# Patient Record
Sex: Male | Born: 1935 | Race: White | Hispanic: No | Marital: Married | Smoking: Former smoker
Health system: Southern US, Community
[De-identification: ages and names within clinical notes are randomized; demographics above are authoritative.]

## PROBLEM LIST (undated history)

## (undated) DIAGNOSIS — K859 Acute pancreatitis without necrosis or infection, unspecified: Secondary | ICD-10-CM

## (undated) DIAGNOSIS — R42 Dizziness and giddiness: Secondary | ICD-10-CM

## (undated) DIAGNOSIS — J189 Pneumonia, unspecified organism: Secondary | ICD-10-CM

## (undated) DIAGNOSIS — R251 Tremor, unspecified: Secondary | ICD-10-CM

## (undated) DIAGNOSIS — C649 Malignant neoplasm of unspecified kidney, except renal pelvis: Secondary | ICD-10-CM

## (undated) DIAGNOSIS — D369 Benign neoplasm, unspecified site: Secondary | ICD-10-CM

## (undated) DIAGNOSIS — K219 Gastro-esophageal reflux disease without esophagitis: Secondary | ICD-10-CM

## (undated) DIAGNOSIS — M1990 Unspecified osteoarthritis, unspecified site: Secondary | ICD-10-CM

## (undated) DIAGNOSIS — K649 Unspecified hemorrhoids: Secondary | ICD-10-CM

## (undated) DIAGNOSIS — K922 Gastrointestinal hemorrhage, unspecified: Secondary | ICD-10-CM

## (undated) DIAGNOSIS — R413 Other amnesia: Secondary | ICD-10-CM

## (undated) DIAGNOSIS — R55 Syncope and collapse: Secondary | ICD-10-CM

## (undated) HISTORY — PX: NEPHRECTOMY: SHX65

## (undated) HISTORY — DX: Gastrointestinal hemorrhage, unspecified: K92.2

## (undated) HISTORY — DX: Benign neoplasm, unspecified site: D36.9

## (undated) HISTORY — PX: KNEE SURGERY: SHX244

## (undated) HISTORY — PX: EYE SURGERY: SHX253

## (undated) HISTORY — DX: Acute pancreatitis without necrosis or infection, unspecified: K85.90

## (undated) HISTORY — PX: SPINAL FUSION: SHX223

## (undated) HISTORY — PX: OTHER SURGICAL HISTORY: SHX169

## (undated) HISTORY — PX: CHOLECYSTECTOMY: SHX55

## (undated) HISTORY — DX: Unspecified hemorrhoids: K64.9

## (undated) HISTORY — DX: Other amnesia: R41.3

## (undated) HISTORY — DX: Unspecified osteoarthritis, unspecified site: M19.90

## (undated) HISTORY — DX: Malignant neoplasm of unspecified kidney, except renal pelvis: C64.9

## (undated) HISTORY — DX: Syncope and collapse: R55

## (undated) HISTORY — PX: BACK SURGERY: SHX140

## (undated) HISTORY — DX: Gastro-esophageal reflux disease without esophagitis: K21.9

## (undated) HISTORY — DX: Dizziness and giddiness: R42

## (undated) HISTORY — PX: SHOULDER SURGERY: SHX246

## (undated) HISTORY — PX: HAND SURGERY: SHX662

## (undated) HISTORY — DX: Tremor, unspecified: R25.1

---

## 1973-09-30 HISTORY — PX: CHOLECYSTECTOMY: SHX55

## 1973-09-30 HISTORY — PX: APPENDECTOMY: SHX54

## 1987-10-01 HISTORY — PX: OTHER SURGICAL HISTORY: SHX169

## 1998-09-30 HISTORY — PX: NEPHRECTOMY: SHX65

## 1998-09-30 HISTORY — PX: KNEE SURGERY: SHX244

## 2007-05-19 ENCOUNTER — Ambulatory Visit: Payer: Self-pay | Admitting: Cardiology

## 2007-06-02 ENCOUNTER — Ambulatory Visit: Payer: Self-pay | Admitting: Internal Medicine

## 2007-06-16 ENCOUNTER — Encounter: Payer: Self-pay | Admitting: Internal Medicine

## 2007-06-16 ENCOUNTER — Ambulatory Visit: Payer: Self-pay | Admitting: Internal Medicine

## 2007-06-16 ENCOUNTER — Ambulatory Visit (HOSPITAL_COMMUNITY): Admission: RE | Admit: 2007-06-16 | Discharge: 2007-06-16 | Payer: Self-pay | Admitting: Internal Medicine

## 2007-06-16 HISTORY — PX: COLONOSCOPY: SHX174

## 2007-06-17 ENCOUNTER — Ambulatory Visit: Payer: Self-pay | Admitting: Physician Assistant

## 2007-12-31 ENCOUNTER — Ambulatory Visit: Payer: Self-pay | Admitting: Internal Medicine

## 2008-02-11 ENCOUNTER — Ambulatory Visit: Payer: Self-pay | Admitting: Internal Medicine

## 2008-05-24 ENCOUNTER — Ambulatory Visit: Payer: Self-pay | Admitting: Internal Medicine

## 2008-08-08 DIAGNOSIS — J189 Pneumonia, unspecified organism: Secondary | ICD-10-CM

## 2008-08-08 DIAGNOSIS — R109 Unspecified abdominal pain: Secondary | ICD-10-CM | POA: Insufficient documentation

## 2008-08-08 DIAGNOSIS — C649 Malignant neoplasm of unspecified kidney, except renal pelvis: Secondary | ICD-10-CM | POA: Insufficient documentation

## 2008-08-08 DIAGNOSIS — R110 Nausea: Secondary | ICD-10-CM

## 2008-08-08 DIAGNOSIS — K859 Acute pancreatitis without necrosis or infection, unspecified: Secondary | ICD-10-CM | POA: Insufficient documentation

## 2008-08-17 ENCOUNTER — Ambulatory Visit: Payer: Self-pay | Admitting: Internal Medicine

## 2009-07-18 ENCOUNTER — Encounter (INDEPENDENT_AMBULATORY_CARE_PROVIDER_SITE_OTHER): Payer: Self-pay | Admitting: *Deleted

## 2009-09-18 ENCOUNTER — Ambulatory Visit: Payer: Self-pay | Admitting: Internal Medicine

## 2009-09-18 DIAGNOSIS — R1032 Left lower quadrant pain: Secondary | ICD-10-CM | POA: Insufficient documentation

## 2009-09-18 DIAGNOSIS — R1013 Epigastric pain: Secondary | ICD-10-CM

## 2009-09-19 ENCOUNTER — Ambulatory Visit: Payer: Self-pay | Admitting: Gastroenterology

## 2009-10-06 ENCOUNTER — Telehealth: Payer: Self-pay | Admitting: Gastroenterology

## 2009-10-12 ENCOUNTER — Encounter: Payer: Self-pay | Admitting: Internal Medicine

## 2009-10-13 ENCOUNTER — Ambulatory Visit (HOSPITAL_COMMUNITY): Admission: RE | Admit: 2009-10-13 | Discharge: 2009-10-13 | Payer: Self-pay | Admitting: Internal Medicine

## 2009-10-16 ENCOUNTER — Telehealth (INDEPENDENT_AMBULATORY_CARE_PROVIDER_SITE_OTHER): Payer: Self-pay | Admitting: *Deleted

## 2009-10-17 ENCOUNTER — Encounter: Payer: Self-pay | Admitting: Internal Medicine

## 2009-10-17 ENCOUNTER — Telehealth (INDEPENDENT_AMBULATORY_CARE_PROVIDER_SITE_OTHER): Payer: Self-pay | Admitting: *Deleted

## 2009-10-26 ENCOUNTER — Encounter: Payer: Self-pay | Admitting: Internal Medicine

## 2009-10-30 ENCOUNTER — Encounter: Payer: Self-pay | Admitting: Internal Medicine

## 2009-11-03 ENCOUNTER — Encounter: Payer: Self-pay | Admitting: Internal Medicine

## 2009-12-07 ENCOUNTER — Encounter: Payer: Self-pay | Admitting: Internal Medicine

## 2009-12-14 ENCOUNTER — Encounter: Payer: Self-pay | Admitting: Internal Medicine

## 2009-12-18 ENCOUNTER — Encounter: Payer: Self-pay | Admitting: Internal Medicine

## 2010-04-19 ENCOUNTER — Ambulatory Visit: Payer: Self-pay | Admitting: Internal Medicine

## 2010-04-19 DIAGNOSIS — K5900 Constipation, unspecified: Secondary | ICD-10-CM | POA: Insufficient documentation

## 2010-04-20 ENCOUNTER — Encounter: Payer: Self-pay | Admitting: Internal Medicine

## 2010-04-25 ENCOUNTER — Ambulatory Visit: Payer: Self-pay | Admitting: Gastroenterology

## 2010-10-30 NOTE — Assessment & Plan Note (Signed)
Summary: CONSULT FOR SURGERY/WANTS RMR ONLY/SS   Visit Type:  Follow-up Visit Primary Care Provider:  Sasser  Chief Complaint:  Consult for surgery.  History of Present Illness: Recurrent pancreatitis. Status post pancreatic and biliary sphincterotomies in a setting of multiple ERCPs in the past few years. multiple episodes of pancreatitis. He's had a number of episodes of abdominal pain lasting 3-4 days this year which have spontaneously resolved.   He has not been hospitalized this year. He's followed closely with Dr. Fredric Mare over at  [facility]. States overall the first 6 months of this years much better than 2010. He has chronic constipation; he does complain of "gurgling" in his abdomen frequently. He is again for having pain since she was seen here in December of 2010. He has no lower taking antispasmodics acid suppression therapy or pancreatic enzyme supplements. There's been some discussion about a peustow procedure as far as treating his pancreatitis from a surgical prospective is concerned. Her some discussion by giving him to see Dr. Rise Mu at  [facility]. Has multiple questions about the surgical option while he remains in fairly good health.  History of a seen in colon adenoma removed by me colonoscopy in 2008. He is due for surveillance colonoscopy 2013.  P  Current Medications (verified): 1)  Hydrocodone .... As Needed  Allergies (verified): 1)  ! Reglan  Past History:  Family History: Last updated: 04/19/2010  Adopted but  biological mother died with some type of cancer.  Social History: Last updated: 04/19/2010 Marital Status: Married Children: 3 Occupation: Semi-Retired  Past Surgical History: Spinal Fusion Cholecystectomy Testicular Tumor-Benign Hand Surgery GI Bleed-Ulcer Pancreatic Duct Stent x 3 Right Kidney removed Right knee surgery  Family History:  Adopted but  biological mother died with some type of cancer.  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 The trend, so far,  this year has been one of improvement with fewer,  less severe episodes. Patient is very much interested in at least learning more about the surgical option i.e.  a peustow procedure.  Chronic constipation with abdominal "gurgling". History of a colonic adenoma.  Recommendations: We will go ahead and orchestrate an elective appointment to see Dr. Rise Mu at [facility] just  to get a surgical perspective on this  potential option along with the pros and cons of this avenue of therapy  Liberalize use of MiraLax 17 g orally at bedtime if no bowel movement on any given day.  Trial a probiotic therapy in the way of Align - - one capsule daily. Samples provided.  Plan to see this nice gentleman back in the office in 3 months  Appended Document: CONSULT FOR SURGERY/WANTS RMR ONLY/SS Patient aware.Marland KitchenAppt to see Dr. Marilynn Rail w/ General Surgery on 05/08/10 @ 11:15; will send records.  Appended Document: Orders Update    Clinical Lists Changes  Problems: Added new problem of CONSTIPATION (ICD-564.00) Orders: Added new Service order of Est. Patient Level IV (60454) - Signed

## 2010-10-30 NOTE — Letter (Signed)
Summary: [facility] REFERRAL  [facility] REFERRAL   Imported By: Ave Filter 10/17/2009 12:45:31  _____________________________________________________________________  External Attachment:    Type:   Image     Comment:   External Document

## 2010-10-30 NOTE — Letter (Signed)
Summary: [facility]ICE NOTE/[facility]  OFFICE NOTE/[facility]   Imported By: Diana Eves 12/14/2009 11:53:37  _____________________________________________________________________  External Attachment:    Type:   Image     Comment:   External Document

## 2010-10-30 NOTE — Letter (Signed)
Summary: LABS/[facility]  LABS/[facility]   Imported By: Diana Eves 11/03/2009 12:09:59  _____________________________________________________________________  External Attachment:    Type:   Image     Comment:   External Document

## 2010-10-30 NOTE — Letter (Signed)
Summary: CT ORDER  CT ORDER   Imported By: Ave Filter 10/12/2009 17:18:09  _____________________________________________________________________  External Attachment:    Type:   Image     Comment:   External Document

## 2010-10-30 NOTE — Progress Notes (Signed)
Summary: [facility] Referral  Phone Note Call from Patient Call back at [facility] Phone [phone]   Caller: Patient Reason for Call: Referral Action Taken: Provider Notified, Appt Scheduled Summary of Call: Mr Barth called and about his referral to [facility]...I told Mr Kuss that I have contaced [facility] and was waiting on a date for his appt. and I will call him back shortly.  I called Mr Berkel back and gave him an appt. for 10/23/09 with Dr Fredric Mare. Initial call taken by: Ave Filter,  October 17, 2009 11:17 AM     Appended Document: [facility] Referral Spoke with patient.  Abd pain a little better today.  Able to keep liquids down and urinating every 2-3 hours.  Advised if pain unmanageable or not able to stay hydrated he needs to go to ED at [facility].  Voiced understanding.

## 2010-10-30 NOTE — Progress Notes (Signed)
  Phone Note Outgoing Call Call back at [facility] Phone [phone]   Call placed by: Leanna Battles. Dixon Boos,  October 06, 2009 1:51 PM Call placed to: Patient Summary of Call: [facility].  Need to find out how he's doing.  Any further abd pain.  Still awaiting records from [facility].  If still with abd pain, Dr. Jena Gauss advises noncontrast CT a/p.     Appended Document:  LMOM to call.  Appended Document:  Patient called earlier.  I tried to call back.  LMOAM for return call.  Please let him no above info, but I'll be glad to talk with him if he has any questions.  Appended Document:  Pt called. He is still having some abd pain and agreed to have the CT.  Appended Document:  Pt is scheduled for CT Scan on 10/13/09@9 :30am  Pt aware of appt.  Appended Document:  Spoke with patient.  Last Friday, had pain more c/w pancreatitis.  Dec diet and pain resolved after two days.  Still having LLQ pain. No fever. Some nausea.  Stools thin. No blood.  CT as planned.  Will contact patient tomorrow with results.  If pain worse, devp fever he should go to ED.

## 2010-10-30 NOTE — Assessment & Plan Note (Signed)
Summary: dropped off stool/ss  pt returned ifobt- it was negative/jl  Allergies: 1)  ! Reglan  Other Orders: Immuno-chemical Fecal Occult (16109)  Appended Document: dropped off stool/ss no blood in stool; plan for tcs 2013  Appended Document: dropped off stool/ss pt aware  Appended Document: dropped off stool/ss reminder in computer

## 2010-10-30 NOTE — Progress Notes (Signed)
  Phone Note Call from Patient   Reason for Call: Talk to Doctor Summary of Call: Joshua Strong called wanting to speak to Joshua Strong regarding his appt @ [facility]. He would like for her to call him back to discuss this. He can be reached at [phone] or [phone]-  Initial call taken by: Peggyann Shoals,  October 16, 2009 11:47 AM     Appended Document:  Spoke to wife.  Patient out right now but had rough weekend with vomiting, abd pain into back like pancreatitis.  CT last week showed pancreatitis.  No fever.  Consuming clear liquids and given antiemetic and narcotic by PCP.  Will check with Durward Mallard on status of referral to Dr. Fredric Mare and discuss with Dr. Jena Gauss.    Appended Document:  I called [facility] to follow up on urgent referral for Joshua Strong and they are closed today for the holiday.Marland KitchenMarland KitchenMarland KitchenI will follow up with them on tomorrow.  Appended Document:  Discussed with Dr. Jena Gauss.  He advises patient going to [facility] ED for evaluation.  I notified wife. She states that if he is able to control pain and vomiting and drink plenty of liquids to stay hydrated they may hold off until we are able to contact Dr. Louie Casa office tomorrow.

## 2010-10-30 NOTE — Letter (Signed)
Summary: [facility] Gen surg referral  [facility] Gen surg referral   Imported By: Minna Merritts 04/20/2010 17:43:05  _____________________________________________________________________  External Attachment:    Type:   Image     Comment:   External Document

## 2010-10-30 NOTE — Letter (Signed)
Summary: [facility]   Imported By: Diana Eves 10/30/2009 16:40:28  _____________________________________________________________________  External Attachment:    Type:   Image     Comment:   External Document

## 2010-10-30 NOTE — Letter (Signed)
Summary: [facility]   Imported By: Diana Eves 10/26/2009 16:51:44  _____________________________________________________________________  External Attachment:    Type:   Image     Comment:   External Document

## 2010-10-30 NOTE — Letter (Signed)
Summary: EGD[facility]  EGD/[facility]   Imported By: Diana Eves 12/07/2009 15:44:02  _____________________________________________________________________  External Attachment:    Type:   Image     Comment:   External Document

## 2010-10-30 NOTE — Letter (Signed)
Summary: OFFICE NOTE/[facility]  OFFICE NOTE/[facility]   Imported By: Diana Eves 12/18/2009 16:06:25  _____________________________________________________________________  External Attachment:    Type:   Image     Comment:   External Document

## 2010-11-14 ENCOUNTER — Encounter: Payer: Self-pay | Admitting: Internal Medicine

## 2010-11-21 NOTE — Letter (Signed)
Summary: [facility]  [facility]   Imported By: Rexene Alberts 11/14/2010 09:52:47  _____________________________________________________________________  External Attachment:    Type:   Image     Comment:   External Document

## 2011-02-12 NOTE — Assessment & Plan Note (Signed)
NAME:  Joshua Strong, Joshua Strong                  CHART#:  [id number]   DATE:  08/17/2008                       DOB:  1931-03-22   Followup history of pancreatitis, lastly evaluated with EGD and EUS at  [facility], by Dr. Margaretha Glassing.  He had a normal pancreaticobiliary tree.  There  was no evidence of chronic pancreatitis and also EGD looked good.  The  patient tells me that he has not felt this good in sometime.  He has cut  back on aspirin and has taken a bland diet and feels well.  He is not  having any reflux symptoms either.  He has been extensively evaluated  here at [facility].  He is working out at [employer] and is very active with  his church work.  He has history of colonic adenoma.  Last colonoscopy  was on 06/16/2007.  He is due for surveillance examination in 2013.   CURRENT MEDICATIONS:  See updated list.   ALLERGIES:  Reglan.   PHYSICAL EXAMINATION:  GENERAL:  He looks really well today.  VITAL SIGNS:  Weight 177, height 5 feet 7 inches, temp 97.4, and BP  102/68, and pulse 60.  SKIN:  Warm and dry.  ABDOMEN:  Flat, positive bowel sounds.  Entirely soft and nontender.  No  appreciable mass or organomegaly.   ASSESSMENT:  Overall, the patient is doing well.  He has no evidence of  any pancreatic or biliary abnormalities on most recent endoscopic  ultrasound.  He has modified his diet, not mentioned above, cut back  significantly on chocolate and seems to be doing very well with that  approach.   RECOMMENDATIONS:  We will plan to see him back in 1 year to touch base  and I see no need for any further GI evaluation at this time.  We would  slate him for a surveillance colonoscopy in 2013.       Jonathon Bellows, M.D.  Electronically Signed     RMR/MEDQ  D:  08/17/2008  T:  08/18/2008  Job:  [id number]   cc:   Fara Chute

## 2011-02-12 NOTE — Assessment & Plan Note (Signed)
NAME:  Joshua Strong, Joshua Strong                  CHART#:  [id number]   DATE:  02/11/2008                       DOB:  [birth year]   Followup history of recurrent pancreatitis, history of colonic adenoma,  gastroesophageal reflux disease, history of renal cell carcinoma, status  post unilateral nephrectomy with elevated creatinine.  Mr. Vallone was last  seen here December 31, 2007.  He states over any given month he has one  episode of pain, sometimes postprandial, sometimes occurs at night,  which starts fairly acutely in the periumbilical area.  Sometimes he has  some vomiting.  He takes hydrocodone and sometimes Phenergan and within  30 to 40 minutes the pain resolves.  The remainder of the time he  actually does fairly well.  He has a history of sphincter hypertension  and has undergone ERCP with sphincterotomy.  Previously he has been seen  by Dr. Noland Fordyce and Dr. Carla Drape over at [facility].  He has a  history of nephrectomy for renal cell carcinoma, has had chronically  elevated creatinine in the 2 range.  He is back on pancreatic enzymes  with Ultrase 3 with meals, 2 with snacks, and Nexium 40 grams orally  daily concomitantly and generally is doing fairly well the remainder of  the time.  At the time of my last conversation with Dr. Fredric Mare, it was  felt that he really did not have anything else to offer him as far as  biliary pancreatic intervention is concerned.  They left the door open  for further followup.  Back on December 23, 2007, his chem-20 looked good  except that the amylase was slightly elevated at 176.  Lipase was 50.  LFTs were normal.  Creatinine was up at 2.0.  CBC:  White count 7, H&H  13.4 and 38.7.  He has a history of colonic adenomas, due for  surveillance in 5 years.   CURRENT MEDICATIONS:  See updated list.   ALLERGIES:  REGLAN.   PHYSICAL EXAMINATION:  GENERAL/VITAL SIGNS:  On exam today, he appears  well, at his baseline weight 179, which is down 3-1/2 pounds.   Height  5'7, temperature 97.9, blood pressure 120/80, pulse 64.  SKIN:  Warm and dry.  CHEST:  Lungs are clear to auscultation.  CARDIAC:  Regular rate and rhythm without murmur, gallop or rub.  ABDOMEN:  Nondistended.  Positive bowel sounds.  Entirely soft and  nontender without appreciable mass or organomegaly.   ASSESSMENT:  History of recurrent pancreatitis.  Overall he is doing  much better than he did 2 to 3 or more years ago since he has had  biliary intervention.  The pain he is describing now once monthly really  comes and goes too quickly to make me think he has had significant  recurrent pancreatitis.  He may have an element of small bowel spasm or  some persisting biliary sphincter spasm.  All in all, though, generally  he is doing much better than he did years ago.  Pancreatic enzymes may  be helping.  It is a certainly benign therapy and would like to keep him  on that regimen for the time being.   RECOMMENDATIONS:  1. Continue Nexium concomitant with daily enzyme therapy.  2. We will add HyoMax 0.125 mg 1 to be taken sublingually should he  have one of his monthly attacks.  He is to keep a diary as far as      frequency of attacks is concerned.  3. I plan to see him back in 6 months, slate him for surveillance      colonoscopy in 5 years.       Jonathon Bellows, M.D.  Electronically Signed     RMR/MEDQ  D:  02/11/2008  T:  02/11/2008  Job:  [id number]   cc:   Fara Chute

## 2011-02-12 NOTE — H&P (Signed)
NAME:  Joshua Strong, Joshua Strong              ACCOUNT NO.:  [id number]   MEDICAL RECORD NO.:  [id number]          PATIENT TYPE:  AMB   LOCATION:  DAY                           FACILITY:  [facility]   PHYSICIAN:  R. Roetta Sessions, M.D. DATE OF BIRTH:  [birth year]   DATE OF ADMISSION:  DATE OF DISCHARGE:  LH                              HISTORY & PHYSICAL   PRIMARY CARE:  Fara Chute in [place]   CARDIOLOGIST:  Learta Codding, MD,FACC   CHIEF COMPLAINT:  Constipation, intermittent rectal bleeding.  Mr.  Joshua Strong __________ is Joshua very pleasant 77 year old gentleman with Joshua long  history of chronic abdominal pain secondary to pancreatic or biliary  sphincter hypertension, who has been seen here just over 3 years.  He  returns noting he has passed some intermittent blood per rectum recently  in Joshua setting of constipation.  He has chronic waxing, waning abdominal  pain.  He has had extensive workup over at [facility].  He tells me he has  now had 7 ERCPs.  He has been under the care of Dr. Carlena Bjornstad there.  At this point in time, it sounds like everything has been done endoscopy-  wise to treat Mr. __________  pain and plans have been made to send to Joshua  pain clinic.  Overall he is much better than he was when the saga began  back around 2004.   Also, since he was last seen here on serial radiographic studies, he was  noted to have an enlarging left renal mass and ultimately had two  surgeries for this problem. He was found to have renal cell carcinoma.  He was felt to be cured and did not require any adjuvant therapy.  Apparently last year he had Joshua partial nephrectomy and had Joshua leak  requiring ultimate total nephrectomy with Joshua second procedure.  There is  no known family history of colorectal cancer.  However, Mr. __________  is adopted   He did undergo Joshua colonoscopy back in 2002 by Dr. Elmyra Ricks at  [facility] without significant findings.  He is down 5 pounds  since he was last seen here in  2005.   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  [name].  He had  testicular growth removed cholecystectomy, three back surgeries, cancer  removed from the right kidney/right nephrectomy, right knee surgery,  right hand surgery.  He was hospitalized three weeks ago for bronchitis  at __________  Hospital.   CURRENT MEDICATIONS:  Multivitamin supplement, fish oil, calcium  supplement, aspirin 81 mg daily, fludrocortisone 0.1 mg daily.   ALLERGIES:  Reglan.   FAMILY HISTORY:  Largely unknown because he was adopted.  He has one  sister in good health at age 63, two siblings succumbed one to  alcoholism, one to Joshua heart attack.   SOCIAL HISTORY:  The patient is married, he is Joshua retired Optician, dispensing.  He  works part-time in [employer].  He works out at [employer] on Joshua regular basis. No  tobacco, no alcohol and no  illicit drugs.   REVIEW OF SYSTEMS:  No chest pain, no dyspnea on exertion.  No fever,  chills.  No recent significant weight loss, abdominal pain as described  above, intermittent rectal bleeding but no melena.   PHYSICAL EXAMINATION:  GENERAL:  Joshua 77 year old gentleman resting  comfortably, weight 174, height 5 foot 7, temperature 98.1, BP 120/70,  pulse 64.  SKIN:  Warm and dry.  There is no jaundice or stigmata of chronic liver  disease.  HEENT: Exam no scleral icterus.  Conjunctivae pink.  Oral cavity no  lesions.  CHEST:  Lungs clear to auscultation.  CARDIAC:  Regular rate and rhythm without murmur, gallop,  rub.  ABDOMEN:  Nondistended, positive bowel sounds, soft.  He does have some  periumbilical discomfort on fairly deep palpation.  No appreciable mass  or organomegaly.  EXTREMITIES: No edema.  RECTAL:  Exam deferred to time of colonoscopy.   IMPRESSIONS:  [name] __________ is Joshua  77-year gentleman who presents  with intermittent hematochezia is Joshua setting of constipation recently.  He has chronic  waxing, waning, abdominal pain secondary to smoldering  pancreatitis largely related to sphincter dysfunction.  He has required  numerous pancreatic and biliary procedures at [facility].  It has been some 6 years since he last had Joshua colonoscopy.   RECOMMENDATIONS:  I have offered Mr. __________  Joshua diagnostic  colonoscopy at this point in time to further evaluate why he has rectal  bleeding.  Potential risks, benefits and alternatives  have been  reviewed.  His questions were answered.  He is agreeable.  We will also  see where we stand with Joshua serum amylase, lipase, LFTs and CBC today.  I  have urged him to keep his follow-up appointment with Dr. Carlena Bjornstad at  [facility] and make further recommendations in the  very near future.      Joshua Strong, M.D.  Electronically Signed     RMR/MEDQ  D:  06/02/2007  T:  06/02/2007  Job:  [id number]   cc:   Dr Vanessa Barbara [place]

## 2011-02-12 NOTE — Assessment & Plan Note (Signed)
NAME:  Joshua Strong, Joshua Strong                  CHART#:  [id number]   DATE:  12/31/2007                       DOB:  [birth year]   REASON FOR VISIT:  Followup recurrent pancreatitis, rectal bleeding.   HISTORY OF PRESENT ILLNESS:  I last saw Joshua Strong on June 02, 2007 at  which time he underwent a colonoscopy for rectal bleeding.  He was found  to have minimal internal hemorrhoids.  He had a polyp which was removed,  turned out to be adenoma.  He is due for followup in five years.  He has  had problems with recurrent pancreatitis.  He has seen Dr. Noland Fordyce  over at [facility] on multiple occasions.  I had conversation with him back  in September and did not feel that any other biliary intervention was  warranted.  He saw Dr. Fredric Mare a couple of months ago who put him on  Nexium for reflux.  He continues to have waxing and waning and typical  periumbilical pain consistent with mild pancreatitis.  He is not having  any diarrhea.  He has actually gained 8 pounds since he was seen in  September 2008 by our office scales.  He is status post unilateral  nephrectomy for renal cell carcinoma.  We did some labs on him the other  day when he called in with a flare of pain and started him on some  Vicodin.  We noted his creatinine was slightly elevated at 2, BUN up at  21 (I do not have a prior baseline creatinine for comparison).  Otherwise, Joshua Strong is doing well.  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   CURRENT MEDICATIONS:  See updated list.  He is not taking any  nonsteroidal's.   ALLERGIES:  REGLAN.   PHYSICAL EXAMINATION:  GENERAL:  On exam today he appears in no acute  distress whatsoever.  VITAL SIGNS:  Weight 182.5 pounds, height 5 feet 7 inches, temperature  97.9, blood pressure 122/68, pulse 80.  SKIN:  Warm and dry.  There is no jaundice.  ABDOMEN:  Flat, positive bowel sounds, soft,  nontender without  appreciable mass or organomegaly.   ASSESSMENT:  1. Recurrent abdominal pain secondary to low grade pancreatitis.  He      has been extensively evaluated here and elsewhere, (i.e. [facility]).      At this point I feel it would be reasonable just to go ahead and      add some pancreatic enzymes to his regimen. Will start Pancrease 3      capsules with meals, two with snacks.  The importance of taking a      proton pump inhibitor, such as Nexium daily with pancreatic enzymes      was emphasized.  2. History of colonic adenoma.  He is due for surveillance in five      years.  3. Gastroesophageal reflux disease.  Continue Nexium.  4. History of renal cell carcinoma, status post unilateral      nephrectomy.  He has a creatinine of 2.0 and a BUN of 21.  I      emphasized adequate daily fluids.   Not mentioned above, he said he was taken to  the hospital one day after  he passed out and he was told he had dry heart from sinusitis and  bronchitis and dehydration.   I will send a copy of these labs to  Dr. Neita Carp for his review.  I told  Joshua Strong not to take any over the counter medications or any other  medications for that matter, unless Dr. Neita Carp is aware.  He should stay  away from nonsteroid agents.   We will plan to see this nice gentleman back in six weeks.       Jonathon Bellows, M.D.  Electronically Signed     RMR/MEDQ  D:  12/31/2007  T:  12/31/2007  Job:  [id number]   cc:   Fara Chute

## 2011-02-12 NOTE — Assessment & Plan Note (Signed)
Joshua Strong, Joshua Strong                  CHART#:  [id number]   DATE:  05/24/2008                       DOB:  03-04-1931   </CHIEF COMPLAINT  RUQ abdominal pain.   PROBLEM LIST:  1. Recurrent pancreatitis, history of sphincter hypertension and      endoscopic retrograde cholangiopancreatography with sphincterotomy      by Dr. Danny Lawless over at [facility]  2. Colonic adenomas.  3. Gastroesophageal reflux disease.  4. Renal cell carcinoma, status post unilateral nephrectomy with      elevated creatinine.   SUBJECTIVE:  The patient is a 77 year old Caucasian male.  He presents  with an approximate 3-month history of severe right upper quadrant pain.  He tells me the pain is usually after midnight while he is sleeping.  It  awakens him from rest.  The pain is usually 6/10 on the pain scale.  He  describes the pain as piercing or burning.  It has been happening on a  daily basis.  At times, it happens throughout the day 25-30 minutes  after eating.  He does seem to think HyoMax p.r.n. helps with his pain.  He is on Nexium 40 mg daily.  His weight has remained stable.  He is  scheduled to see Dr. Neita Carp next month for his complete physical.  His  pain usually lasts a couple of hours.  It does not radiate to his back.  He generally has a bowel movement every day or every other day.  Denies  any rectal bleeding or melena or clay-colored stools.  Denies any  heartburn or indigestion.  He uses Vicodin p.r.n. for his pain.   CURRENT MEDICATIONS:  See the list from 05/24/2008.   ALLERGIES:  Reglan.   OBJECTIVE:  VITAL SIGNS:  Weight 182.5 pounds, height 67 inches,  temperature 98.1, blood pressure 140/84, and pulse 60.  GENERAL:  He is an elderly Caucasian male who is alert, oriented,  pleasant, and cooperative in no acute distress.  HEENT:  Sclerae clear, nonicteric.  Conjunctivae pink.  Oropharynx pink  and moist without any lesions.  CHEST:   Heart regular rate and rhythm.  Normal S1, S2 without any  murmurs, clicks, rubs, or gallops.  ABDOMEN:  Positive bowel sounds x4.  No bruits auscultated.  Soft,  nontender, nondistended without palpable mass or hepatosplenomegaly.  No  rebound tenderness or guarding.  EXTREMITIES:  Without clubbing or edema.   ASSESSMENT:  The patient is a 77 year old Caucasian male who presents  with a 12-month history of almost daily severe right upper quadrant pain.  He has history of recurrent pancreatitis, which I suspect may be the  culprit of his pain.  He also has history of sphincter hypertension and  has had sphincterotomy by Dr. Danny Lawless at [facility] previously.   PLAN:  1. He is to continue Ultrase 3 with meals and 2 with snacks.  2. Continue HyoMax p.r.n.  3. Vicodin 5/500 mg 1 p.o. q.4-6h. p.r.n. severe pain, #25 with no      refills.  4. Next colonoscopy in 2014.  5. CBC, amylase, lipase, and LFTs.  6. After reviewing lab work, we will discuss further with Dr. Jena Gauss      and plan appropriate  treatment       [name], N.P.  Electronically Signed     R. Roetta Sessions, M.D.  Electronically Signed    KJ/MEDQ  D:  05/24/2008  T:  05/25/2008  Job:  [id number]   cc:   Fara Chute

## 2011-02-12 NOTE — Assessment & Plan Note (Signed)
[facility]                          [facility] OFFICE NOTE   NAME:Joshua Strong                     MRN:          [id number]  DATE:06/17/2007                            DOB:          [birth year]    REFERRING PHYSICIAN:  Fara Chute   HISTORY OF PRESENT ILLNESS:  The patient is a 77 year old male we saw in  the hospital on May 20, 2007 with a history of syncope, after the  patient presented with bronchitis through Dr. Neita Carp.  The patient was  given a prescription for prednisone, Avelox and Tussionex.  Shortly  after taking some of his medications, the patient had a syncopal  episode.  After thorough evaluation including an echocardiographic study  which showed hyperdynamic LV contraction, we felt that this was likely a  vasovagally mediated/neurocardiogenic syncopal event.  The patient  reportedly also had received some inhaler treatment.  We are recommended  to avoid beta adrenergic drugs, and the patient was given a prescription  for fludrocortisone and given intravenous fluids prior to hospital  discharge.  The patient has not done well.  He has had no recurrent  syncope.  He states that he still has 5 more pills of fludrocortisone  left.  He has also been making sure that he keeps well hydrated.  Unfortunately, he still drinks a lot of caffeinated beverages, which I  advised him to stop doing.   The patient otherwise has no cardiovascular-related symptoms.  He has no  chest pain, shortness of breath, orthopnea or PND.   MEDICATIONS:  1. Aspirin 81 mg a day.  2. Fish oil with vitamin D.  3. Centrum Silver.  4. Florinef 0.1 mg p.o. q.d.   PHYSICAL EXAMINATION:  VITAL SIGNS:  Blood pressure 136/80, heart rate  71 beats per minutes, he weighs 171 pounds.  NECK:  No carotid upstrokes, no carotid bruits.  LUNGS:  Clear breath sounds bilaterally.  HEART:  Regular rate and rhythm, normal sinus.  ABDOMEN:  Soft.  EXTREMITIES:  No  cyanosis, clubbing or edema.  NEURO:  Patient alert, oriented, grossly nonfocal.   ECHOCARDIOGRAM:  Normal sinus rhythm with occasional PVCs.  Intervals,  however, are within normal limits.  In particular, QTCs 432  milliseconds.   PROBLEM LIST:  1. Status post recent syncope.      a.     Vasovagal.      b.     Dehydration.      c.     Mediated by beta adrenergic agents in setting of volume       depletion.  2. Multiple cardiac risk factors.  3. Recurrent chronic pancreatitis, followed at [facility].  4. COPD and remote tobacco use.  5. Renal insufficiency status post nephrectomy for renal cancer in      2007.   PLAN:  1. The patient is doing quite well.  He has no recurrent syncope.  I      told him that he can discontinue fludrocortisone after his      prescription has expired.  2. I advised the patient to make sure he drinks plenty  of fluids and      avoid caffeinated beverages.  3. At this point in time, I do not think an ischemia workup is      indicated, but we will follow up the patient in one year.     Learta Codding, MD,FACC  Electronically Signed    GED/MedQ  DD: 06/17/2007  DT: 06/17/2007  Job #: [id number]   cc:   Fara Chute

## 2011-02-12 NOTE — Op Note (Signed)
NAME:  Joshua Strong, Joshua Strong                      ACCOUNT NO.:  [id number]   MEDICAL RECORD NO.:  [id number]          PATIENT TYPE:  AMB   LOCATION:  DAY                           FACILITY:  [facility]   PHYSICIAN:  Joshua Strong, M.D. DATE OF BIRTH:  [birth year]   DATE OF PROCEDURE:  06/16/2007  DATE OF DISCHARGE:                                PROCEDURE NOTE   PROCEDURES:  Colonoscopy and snare polypectomy.   INDICATIONS FOR PROCEDURE:  Joshua Strong 77 year old gentleman who has been  hemoccult positive recently and notes intermittent low-volume  hematochezia in the setting of constipation.  He tells me he had Joshua Strong  colonoscopy by Dr. Elmyra Strong up in [place] and 2002 without  significant findings.  He has had some recurrence of his chronic  abdominal pain for which he has been exhaustively evaluated over at [facility] by Dr. Noland Strong [facility]. He tells me he has had Joshua Strong total of 70 ERCPs plus multiple  other imaging studies. Through my office on June 03, 2007, his CBC  looked good.  His LFTs were normal. Amylase and lipase mildly elevated  127 and 82, respectively.  There is no family history of colorectal  neoplasia.  Colonoscopy is done to further evaluate his symptoms.  This  approach has been discussed with the patient at length.  Potential  risks, benefits, and alternatives have been reviewed, questions  answered.  Please see the documentation in the medical record.   PROCEDURE NOTE:  O2 saturation, blood pressure, pulse, and respirations  were monitored for the entire procedure.   CONSCIOUS SEDATION:  Versed 4 mg IV, Demerol 75 mg IV in divided doses.   EQUIPMENT:  Gastric Pentax video-chip system.   FINDINGS:  Digital rectal exam revealed no abnormalities.  These prep  was adequate.   Colon:  The colonic mucosa was surveyed from the rectosigmoid junction  through the left transverse right colon, appendiceal orifice, ileocecal  valve to the cecum.   Structures well seen and photographed for the  record. From this level, the scope was withdrawn and previous mucosal  surfaces were again seen. The patient had Joshua Strong 3-5 mm pedunculated polyps  in the proximal ascending colon, and they were all cold snared. Two were  recovered; one was lost in recovery. The remainder of colonic mucosa  appeared normal.   Cecum, ileocecal, appendiceal orifice well seen and photographed for the  record. From this level, scope was withdrawn and previously surveyed  mucosal surfaces were again seen. The scope was pulled out of the rectum  where thorough examination of the rectal mucosa including retroflexed  view of the anal verge demonstrated minimal internal hemorrhoids only.  The patient tolerated the procedure well as was reacted.   IMPRESSION:  1. Minimal internal hemorrhoids, otherwise normal rectum.  2. Ascending colon positive as described above.   RECOMMENDATIONS:  1. Follow up on pathology.  2. Literature on polyps and hemorrhage provided to Joshua Strong.  3. Daily fiber supplement. Consider MiraLax 17 grams orally at bedtime  to combat constipation.  4. Joshua Strong 10-day course of Anusol HC suppositories.   I called Dr. Noland Strong at [facility] about his recurrent abdominal pain,  mildly elevated amylase and lipase. He reviewed over the telephone his  extensive workup to date, and it is felt that he may well be left with Joshua Strong  degree of chronic abdominal pain.  It appeared it was not felt that  further imaging would be much help.  Joshua Strong has been offered pain  management referral previously but has not yet responded. I will  reiterate  to Joshua Strong today that going back over to [facility] to be seen  by the pain management folks may be Joshua Strong worthwhile endeavor for him.  Dr.  Fredric Strong gave me his nurse, Joshua Strong's, triage number should he decide to  proceed with pain management referral [phone]).  I will be back in  contact with Joshua Strong once the pathology on  the polyps returns.      Joshua Strong, M.D.  Electronically Signed     RMR/MEDQ  D:  06/16/2007  T:  06/16/2007  Job:  [id number]   cc:   Joshua Strong [facility]  Fax: [phone]   Joshua Codding, MD,FACC  [address]  [place], [place] [address]

## 2011-06-13 ENCOUNTER — Ambulatory Visit (INDEPENDENT_AMBULATORY_CARE_PROVIDER_SITE_OTHER): Payer: Self-pay | Admitting: Ophthalmology

## 2011-06-13 ENCOUNTER — Ambulatory Visit (INDEPENDENT_AMBULATORY_CARE_PROVIDER_SITE_OTHER): Payer: Medicare Other | Admitting: Ophthalmology

## 2011-06-13 DIAGNOSIS — H33309 Unspecified retinal break, unspecified eye: Secondary | ICD-10-CM

## 2011-06-13 DIAGNOSIS — H26499 Other secondary cataract, unspecified eye: Secondary | ICD-10-CM

## 2011-06-20 ENCOUNTER — Encounter (INDEPENDENT_AMBULATORY_CARE_PROVIDER_SITE_OTHER): Payer: Medicare Other | Admitting: Ophthalmology

## 2011-06-20 DIAGNOSIS — H2700 Aphakia, unspecified eye: Secondary | ICD-10-CM

## 2011-09-13 ENCOUNTER — Telehealth: Payer: Self-pay | Admitting: Internal Medicine

## 2011-09-13 NOTE — Telephone Encounter (Signed)
Spoke with AS about pain meds affecting pancreas, she said it shouldn't bother it. Spoke with pts wife and informed her.

## 2011-09-13 NOTE — Telephone Encounter (Signed)
Patient had shoulder surgery he needs to know if the pain medication the surgeon has him on will affect his pancreas? Please advise ASAP!!

## 2011-09-16 HISTORY — PX: SHOULDER SURGERY: SHX246

## 2011-10-01 DIAGNOSIS — D369 Benign neoplasm, unspecified site: Secondary | ICD-10-CM

## 2011-10-01 HISTORY — DX: Benign neoplasm, unspecified site: D36.9

## 2011-10-03 DIAGNOSIS — M25419 Effusion, unspecified shoulder: Secondary | ICD-10-CM | POA: Diagnosis not present

## 2011-10-03 DIAGNOSIS — M25519 Pain in unspecified shoulder: Secondary | ICD-10-CM | POA: Diagnosis not present

## 2011-10-03 DIAGNOSIS — M25619 Stiffness of unspecified shoulder, not elsewhere classified: Secondary | ICD-10-CM | POA: Diagnosis not present

## 2011-10-03 DIAGNOSIS — M6281 Muscle weakness (generalized): Secondary | ICD-10-CM | POA: Diagnosis not present

## 2011-10-04 DIAGNOSIS — M25419 Effusion, unspecified shoulder: Secondary | ICD-10-CM | POA: Diagnosis not present

## 2011-10-04 DIAGNOSIS — M6281 Muscle weakness (generalized): Secondary | ICD-10-CM | POA: Diagnosis not present

## 2011-10-04 DIAGNOSIS — M25619 Stiffness of unspecified shoulder, not elsewhere classified: Secondary | ICD-10-CM | POA: Diagnosis not present

## 2011-10-04 DIAGNOSIS — M25519 Pain in unspecified shoulder: Secondary | ICD-10-CM | POA: Diagnosis not present

## 2011-10-07 DIAGNOSIS — Z1289 Encounter for screening for malignant neoplasm of other sites: Secondary | ICD-10-CM | POA: Diagnosis not present

## 2011-10-07 DIAGNOSIS — C649 Malignant neoplasm of unspecified kidney, except renal pelvis: Secondary | ICD-10-CM | POA: Diagnosis not present

## 2011-10-07 DIAGNOSIS — N402 Nodular prostate without lower urinary tract symptoms: Secondary | ICD-10-CM | POA: Diagnosis not present

## 2011-10-08 DIAGNOSIS — M25619 Stiffness of unspecified shoulder, not elsewhere classified: Secondary | ICD-10-CM | POA: Diagnosis not present

## 2011-10-08 DIAGNOSIS — M25519 Pain in unspecified shoulder: Secondary | ICD-10-CM | POA: Diagnosis not present

## 2011-10-08 DIAGNOSIS — M6281 Muscle weakness (generalized): Secondary | ICD-10-CM | POA: Diagnosis not present

## 2011-10-08 DIAGNOSIS — M25419 Effusion, unspecified shoulder: Secondary | ICD-10-CM | POA: Diagnosis not present

## 2011-10-09 DIAGNOSIS — M6281 Muscle weakness (generalized): Secondary | ICD-10-CM | POA: Diagnosis not present

## 2011-10-09 DIAGNOSIS — M25419 Effusion, unspecified shoulder: Secondary | ICD-10-CM | POA: Diagnosis not present

## 2011-10-09 DIAGNOSIS — M25519 Pain in unspecified shoulder: Secondary | ICD-10-CM | POA: Diagnosis not present

## 2011-10-09 DIAGNOSIS — M25619 Stiffness of unspecified shoulder, not elsewhere classified: Secondary | ICD-10-CM | POA: Diagnosis not present

## 2011-10-10 DIAGNOSIS — M6281 Muscle weakness (generalized): Secondary | ICD-10-CM | POA: Diagnosis not present

## 2011-10-10 DIAGNOSIS — M25419 Effusion, unspecified shoulder: Secondary | ICD-10-CM | POA: Diagnosis not present

## 2011-10-10 DIAGNOSIS — M25519 Pain in unspecified shoulder: Secondary | ICD-10-CM | POA: Diagnosis not present

## 2011-10-10 DIAGNOSIS — M25619 Stiffness of unspecified shoulder, not elsewhere classified: Secondary | ICD-10-CM | POA: Diagnosis not present

## 2011-10-14 DIAGNOSIS — M25419 Effusion, unspecified shoulder: Secondary | ICD-10-CM | POA: Diagnosis not present

## 2011-10-14 DIAGNOSIS — M6281 Muscle weakness (generalized): Secondary | ICD-10-CM | POA: Diagnosis not present

## 2011-10-14 DIAGNOSIS — M25519 Pain in unspecified shoulder: Secondary | ICD-10-CM | POA: Diagnosis not present

## 2011-10-14 DIAGNOSIS — M25619 Stiffness of unspecified shoulder, not elsewhere classified: Secondary | ICD-10-CM | POA: Diagnosis not present

## 2011-10-16 DIAGNOSIS — M6281 Muscle weakness (generalized): Secondary | ICD-10-CM | POA: Diagnosis not present

## 2011-10-16 DIAGNOSIS — M25419 Effusion, unspecified shoulder: Secondary | ICD-10-CM | POA: Diagnosis not present

## 2011-10-16 DIAGNOSIS — M25519 Pain in unspecified shoulder: Secondary | ICD-10-CM | POA: Diagnosis not present

## 2011-10-16 DIAGNOSIS — M25619 Stiffness of unspecified shoulder, not elsewhere classified: Secondary | ICD-10-CM | POA: Diagnosis not present

## 2011-10-17 DIAGNOSIS — M25619 Stiffness of unspecified shoulder, not elsewhere classified: Secondary | ICD-10-CM | POA: Diagnosis not present

## 2011-10-17 DIAGNOSIS — M25519 Pain in unspecified shoulder: Secondary | ICD-10-CM | POA: Diagnosis not present

## 2011-10-17 DIAGNOSIS — M6281 Muscle weakness (generalized): Secondary | ICD-10-CM | POA: Diagnosis not present

## 2011-10-17 DIAGNOSIS — M25419 Effusion, unspecified shoulder: Secondary | ICD-10-CM | POA: Diagnosis not present

## 2011-10-21 DIAGNOSIS — M6281 Muscle weakness (generalized): Secondary | ICD-10-CM | POA: Diagnosis not present

## 2011-10-21 DIAGNOSIS — M25519 Pain in unspecified shoulder: Secondary | ICD-10-CM | POA: Diagnosis not present

## 2011-10-21 DIAGNOSIS — M25419 Effusion, unspecified shoulder: Secondary | ICD-10-CM | POA: Diagnosis not present

## 2011-10-21 DIAGNOSIS — M25619 Stiffness of unspecified shoulder, not elsewhere classified: Secondary | ICD-10-CM | POA: Diagnosis not present

## 2011-10-23 DIAGNOSIS — M25619 Stiffness of unspecified shoulder, not elsewhere classified: Secondary | ICD-10-CM | POA: Diagnosis not present

## 2011-10-23 DIAGNOSIS — M6281 Muscle weakness (generalized): Secondary | ICD-10-CM | POA: Diagnosis not present

## 2011-10-23 DIAGNOSIS — M25419 Effusion, unspecified shoulder: Secondary | ICD-10-CM | POA: Diagnosis not present

## 2011-10-23 DIAGNOSIS — M25519 Pain in unspecified shoulder: Secondary | ICD-10-CM | POA: Diagnosis not present

## 2011-10-24 DIAGNOSIS — M6281 Muscle weakness (generalized): Secondary | ICD-10-CM | POA: Diagnosis not present

## 2011-10-24 DIAGNOSIS — M25619 Stiffness of unspecified shoulder, not elsewhere classified: Secondary | ICD-10-CM | POA: Diagnosis not present

## 2011-10-24 DIAGNOSIS — M25419 Effusion, unspecified shoulder: Secondary | ICD-10-CM | POA: Diagnosis not present

## 2011-10-24 DIAGNOSIS — M25519 Pain in unspecified shoulder: Secondary | ICD-10-CM | POA: Diagnosis not present

## 2011-10-28 DIAGNOSIS — M6281 Muscle weakness (generalized): Secondary | ICD-10-CM | POA: Diagnosis not present

## 2011-10-28 DIAGNOSIS — M25619 Stiffness of unspecified shoulder, not elsewhere classified: Secondary | ICD-10-CM | POA: Diagnosis not present

## 2011-10-28 DIAGNOSIS — M25419 Effusion, unspecified shoulder: Secondary | ICD-10-CM | POA: Diagnosis not present

## 2011-10-28 DIAGNOSIS — M25519 Pain in unspecified shoulder: Secondary | ICD-10-CM | POA: Diagnosis not present

## 2011-10-31 DIAGNOSIS — M25519 Pain in unspecified shoulder: Secondary | ICD-10-CM | POA: Diagnosis not present

## 2011-10-31 DIAGNOSIS — M25419 Effusion, unspecified shoulder: Secondary | ICD-10-CM | POA: Diagnosis not present

## 2011-10-31 DIAGNOSIS — M25619 Stiffness of unspecified shoulder, not elsewhere classified: Secondary | ICD-10-CM | POA: Diagnosis not present

## 2011-10-31 DIAGNOSIS — M6281 Muscle weakness (generalized): Secondary | ICD-10-CM | POA: Diagnosis not present

## 2011-11-05 DIAGNOSIS — M25619 Stiffness of unspecified shoulder, not elsewhere classified: Secondary | ICD-10-CM | POA: Diagnosis not present

## 2011-11-05 DIAGNOSIS — M6281 Muscle weakness (generalized): Secondary | ICD-10-CM | POA: Diagnosis not present

## 2011-11-05 DIAGNOSIS — M25519 Pain in unspecified shoulder: Secondary | ICD-10-CM | POA: Diagnosis not present

## 2011-11-05 DIAGNOSIS — M25419 Effusion, unspecified shoulder: Secondary | ICD-10-CM | POA: Diagnosis not present

## 2011-11-06 DIAGNOSIS — M19019 Primary osteoarthritis, unspecified shoulder: Secondary | ICD-10-CM | POA: Diagnosis not present

## 2011-11-07 DIAGNOSIS — M25519 Pain in unspecified shoulder: Secondary | ICD-10-CM | POA: Diagnosis not present

## 2011-11-07 DIAGNOSIS — M25419 Effusion, unspecified shoulder: Secondary | ICD-10-CM | POA: Diagnosis not present

## 2011-11-07 DIAGNOSIS — M25619 Stiffness of unspecified shoulder, not elsewhere classified: Secondary | ICD-10-CM | POA: Diagnosis not present

## 2011-11-07 DIAGNOSIS — M6281 Muscle weakness (generalized): Secondary | ICD-10-CM | POA: Diagnosis not present

## 2011-11-13 DIAGNOSIS — M25419 Effusion, unspecified shoulder: Secondary | ICD-10-CM | POA: Diagnosis not present

## 2011-11-13 DIAGNOSIS — M25619 Stiffness of unspecified shoulder, not elsewhere classified: Secondary | ICD-10-CM | POA: Diagnosis not present

## 2011-11-13 DIAGNOSIS — M25519 Pain in unspecified shoulder: Secondary | ICD-10-CM | POA: Diagnosis not present

## 2011-11-13 DIAGNOSIS — M6281 Muscle weakness (generalized): Secondary | ICD-10-CM | POA: Diagnosis not present

## 2011-11-14 DIAGNOSIS — M25419 Effusion, unspecified shoulder: Secondary | ICD-10-CM | POA: Diagnosis not present

## 2011-11-14 DIAGNOSIS — M25619 Stiffness of unspecified shoulder, not elsewhere classified: Secondary | ICD-10-CM | POA: Diagnosis not present

## 2011-11-14 DIAGNOSIS — M6281 Muscle weakness (generalized): Secondary | ICD-10-CM | POA: Diagnosis not present

## 2011-11-14 DIAGNOSIS — M25519 Pain in unspecified shoulder: Secondary | ICD-10-CM | POA: Diagnosis not present

## 2011-11-19 DIAGNOSIS — M6281 Muscle weakness (generalized): Secondary | ICD-10-CM | POA: Diagnosis not present

## 2011-11-19 DIAGNOSIS — J0100 Acute maxillary sinusitis, unspecified: Secondary | ICD-10-CM | POA: Diagnosis not present

## 2011-11-19 DIAGNOSIS — M25419 Effusion, unspecified shoulder: Secondary | ICD-10-CM | POA: Diagnosis not present

## 2011-11-19 DIAGNOSIS — J209 Acute bronchitis, unspecified: Secondary | ICD-10-CM | POA: Diagnosis not present

## 2011-11-19 DIAGNOSIS — M25619 Stiffness of unspecified shoulder, not elsewhere classified: Secondary | ICD-10-CM | POA: Diagnosis not present

## 2011-11-19 DIAGNOSIS — M25519 Pain in unspecified shoulder: Secondary | ICD-10-CM | POA: Diagnosis not present

## 2011-11-21 DIAGNOSIS — M25419 Effusion, unspecified shoulder: Secondary | ICD-10-CM | POA: Diagnosis not present

## 2011-11-21 DIAGNOSIS — M25619 Stiffness of unspecified shoulder, not elsewhere classified: Secondary | ICD-10-CM | POA: Diagnosis not present

## 2011-11-21 DIAGNOSIS — M25519 Pain in unspecified shoulder: Secondary | ICD-10-CM | POA: Diagnosis not present

## 2011-11-21 DIAGNOSIS — M6281 Muscle weakness (generalized): Secondary | ICD-10-CM | POA: Diagnosis not present

## 2011-11-26 DIAGNOSIS — M6281 Muscle weakness (generalized): Secondary | ICD-10-CM | POA: Diagnosis not present

## 2011-11-26 DIAGNOSIS — M25419 Effusion, unspecified shoulder: Secondary | ICD-10-CM | POA: Diagnosis not present

## 2011-11-26 DIAGNOSIS — M25619 Stiffness of unspecified shoulder, not elsewhere classified: Secondary | ICD-10-CM | POA: Diagnosis not present

## 2011-11-26 DIAGNOSIS — M25519 Pain in unspecified shoulder: Secondary | ICD-10-CM | POA: Diagnosis not present

## 2011-11-28 DIAGNOSIS — M25419 Effusion, unspecified shoulder: Secondary | ICD-10-CM | POA: Diagnosis not present

## 2011-11-28 DIAGNOSIS — M25619 Stiffness of unspecified shoulder, not elsewhere classified: Secondary | ICD-10-CM | POA: Diagnosis not present

## 2011-11-28 DIAGNOSIS — M25519 Pain in unspecified shoulder: Secondary | ICD-10-CM | POA: Diagnosis not present

## 2011-11-28 DIAGNOSIS — M6281 Muscle weakness (generalized): Secondary | ICD-10-CM | POA: Diagnosis not present

## 2011-12-04 DIAGNOSIS — M19019 Primary osteoarthritis, unspecified shoulder: Secondary | ICD-10-CM | POA: Diagnosis not present

## 2012-01-09 DIAGNOSIS — H26499 Other secondary cataract, unspecified eye: Secondary | ICD-10-CM | POA: Diagnosis not present

## 2012-02-10 DIAGNOSIS — Z79899 Other long term (current) drug therapy: Secondary | ICD-10-CM | POA: Diagnosis not present

## 2012-02-10 DIAGNOSIS — E782 Mixed hyperlipidemia: Secondary | ICD-10-CM | POA: Diagnosis not present

## 2012-02-19 DIAGNOSIS — M47817 Spondylosis without myelopathy or radiculopathy, lumbosacral region: Secondary | ICD-10-CM | POA: Diagnosis not present

## 2012-02-19 DIAGNOSIS — R5383 Other fatigue: Secondary | ICD-10-CM | POA: Diagnosis not present

## 2012-02-19 DIAGNOSIS — M542 Cervicalgia: Secondary | ICD-10-CM | POA: Diagnosis not present

## 2012-02-19 DIAGNOSIS — F329 Major depressive disorder, single episode, unspecified: Secondary | ICD-10-CM | POA: Diagnosis not present

## 2012-02-19 DIAGNOSIS — M25559 Pain in unspecified hip: Secondary | ICD-10-CM | POA: Diagnosis not present

## 2012-02-19 DIAGNOSIS — R5381 Other malaise: Secondary | ICD-10-CM | POA: Diagnosis not present

## 2012-02-19 DIAGNOSIS — K5909 Other constipation: Secondary | ICD-10-CM | POA: Diagnosis not present

## 2012-02-19 DIAGNOSIS — M67919 Unspecified disorder of synovium and tendon, unspecified shoulder: Secondary | ICD-10-CM | POA: Diagnosis not present

## 2012-02-19 DIAGNOSIS — M25519 Pain in unspecified shoulder: Secondary | ICD-10-CM | POA: Diagnosis not present

## 2012-02-19 DIAGNOSIS — K861 Other chronic pancreatitis: Secondary | ICD-10-CM | POA: Diagnosis not present

## 2012-02-19 DIAGNOSIS — E7800 Pure hypercholesterolemia, unspecified: Secondary | ICD-10-CM | POA: Diagnosis not present

## 2012-03-05 DIAGNOSIS — M129 Arthropathy, unspecified: Secondary | ICD-10-CM | POA: Diagnosis not present

## 2012-03-05 DIAGNOSIS — R1012 Left upper quadrant pain: Secondary | ICD-10-CM | POA: Diagnosis not present

## 2012-03-14 DIAGNOSIS — M545 Low back pain: Secondary | ICD-10-CM | POA: Diagnosis not present

## 2012-03-17 DIAGNOSIS — M353 Polymyalgia rheumatica: Secondary | ICD-10-CM | POA: Diagnosis not present

## 2012-04-01 DIAGNOSIS — M353 Polymyalgia rheumatica: Secondary | ICD-10-CM | POA: Diagnosis not present

## 2012-04-13 DIAGNOSIS — N400 Enlarged prostate without lower urinary tract symptoms: Secondary | ICD-10-CM | POA: Diagnosis not present

## 2012-04-13 DIAGNOSIS — C649 Malignant neoplasm of unspecified kidney, except renal pelvis: Secondary | ICD-10-CM | POA: Diagnosis not present

## 2012-04-13 DIAGNOSIS — R972 Elevated prostate specific antigen [PSA]: Secondary | ICD-10-CM | POA: Diagnosis not present

## 2012-05-14 ENCOUNTER — Encounter: Payer: Self-pay | Admitting: Internal Medicine

## 2012-05-25 DIAGNOSIS — M353 Polymyalgia rheumatica: Secondary | ICD-10-CM | POA: Diagnosis not present

## 2012-05-28 DIAGNOSIS — R05 Cough: Secondary | ICD-10-CM | POA: Diagnosis not present

## 2012-05-28 DIAGNOSIS — J019 Acute sinusitis, unspecified: Secondary | ICD-10-CM | POA: Diagnosis not present

## 2012-06-05 ENCOUNTER — Other Ambulatory Visit: Payer: Self-pay | Admitting: Internal Medicine

## 2012-06-05 ENCOUNTER — Ambulatory Visit (INDEPENDENT_AMBULATORY_CARE_PROVIDER_SITE_OTHER): Payer: Medicare Other | Admitting: Internal Medicine

## 2012-06-05 ENCOUNTER — Encounter: Payer: Self-pay | Admitting: Internal Medicine

## 2012-06-05 VITALS — BP 119/70 | HR 64 | Temp 98.3°F | Ht 66.0 in | Wt 174.4 lb

## 2012-06-05 DIAGNOSIS — Z8719 Personal history of other diseases of the digestive system: Secondary | ICD-10-CM | POA: Diagnosis not present

## 2012-06-05 DIAGNOSIS — Z86010 Personal history of colonic polyps: Secondary | ICD-10-CM

## 2012-06-05 DIAGNOSIS — Z1211 Encounter for screening for malignant neoplasm of colon: Secondary | ICD-10-CM | POA: Diagnosis not present

## 2012-06-05 MED ORDER — PROMETHAZINE HCL 25 MG PO TABS: 25.0000 mg | ORAL_TABLET | Freq: Three times a day (TID) | ORAL | Status: DC | PRN

## 2012-06-05 MED ORDER — PEG-KCL-NACL-NASULF-NA ASC-C 100 G PO SOLR: 1.0000 | ORAL | Status: DC

## 2012-06-05 NOTE — Progress Notes (Signed)
Primary Care Physician:  Estanislado Pandy, MD Primary Gastroenterologist:  Dr. Jena Gauss  Pre-Procedure History & Physical: HPI:  A Joshua Strong is a 78 y.o. male here for followup colonic adenoma schedule surveillance colonoscopy. Colonic adenoma removed from his ascending colon back in 2008. Has occasional constipation managed with MiraLax. Has not had any significant constipation-occasional management MiraLax. History of chronic pancreatitis. Saw Dr. Marilynn Rail at [facility]. Do not recommend surgery. He's done well from a standpoint of his pancreatitis. Has had some rheumatological issues now on prednisone for presumed PMR. He is to see a rheumatologist [facility].  He gets occasionally nauseated and takes promethazine. He is going to be traveling extensively here in the coming weeks as he given a prescription for promethazine tablets for when necessary use. Past Medical History  Diagnosis Date  . GERD (gastroesophageal reflux disease)   . Pancreatitis   . Renal cell carcinoma   . Tubular adenoma   . GI bleed     ulcer  . Hemorrhoid     Past Surgical History  Procedure Date  . Spinal fusion   . Cholecystectomy   . Testicular tumor   . Hand surgery   . Pancreatic duct stent   . Knee surgery     right  . Colonoscopy 06/16/2007    Dr. Jena Gauss- minimal internal hemorrhoids o/w normal rectum,tubular adenoma  . Shoulder surgery     left    Prior to Admission medications   Medication Sig Start Date End Date Taking? Authorizing Provider  aspirin 81 MG tablet Take 81 mg by mouth daily.   Yes Historical Provider, MD  calcium citrate-vitamin D (CITRACAL+D) 315-200 MG-UNIT per tablet Take 1 tablet by mouth daily.   Yes Historical Provider, MD  HYDROcodone-acetaminophen (VICODIN) 5-500 MG per tablet Take 1 tablet by mouth every 6 (six) hours as needed.   Yes Historical Provider, MD  Multiple Vitamin (MULTIVITAMIN) capsule Take 1 capsule by mouth daily.   Yes Historical Provider, MD  predniSONE  (DELTASONE) 10 MG tablet Take 10 mg by mouth daily.   Yes Historical Provider, MD  Probiotic Product (ALIGN) 4 MG CAPS Take 4 mg by mouth daily.   Yes Historical Provider, MD    Allergies as of 06/05/2012 - Review Complete 06/05/2012  Allergen Reaction Noted  . Metoclopramide hcl      No family history on file.  History   Social History  . Marital Status: Married    Spouse Name: N/A    Number of Children: N/A  . Years of Education: N/A   Occupational History  . Not on file.   Social History Main Topics  . Smoking status: Former Smoker -- 0.5 packs/day    Types: Cigarettes  . Smokeless tobacco: Not on file   Comment: quit in 1958  . Alcohol Use: No  . Drug Use: No  . Sexually Active: Not on file   Other Topics Concern  . Not on file   Social History Narrative  . No narrative on file    Review of Systems: See HPI, otherwise negative ROS  Physical Exam: BP 119/70  Pulse 64  Temp 98.3 F (36.8 C) (Temporal)  Ht 5\' 6"  (1.676 m)  Wt 174 lb 6.4 oz (79.107 kg)  BMI 28.15 kg/m2 General:   Alert,  Well-developed, well-nourished, pleasant and cooperative in NAD Skin:  Intact without significant lesions or rashes. Eyes:  Sclera clear, no icterus.   Conjunctiva pink. Ears:  Normal auditory acuity. Nose:  No deformity, discharge,  or lesions. Mouth:  No deformity or lesions. Neck:  Supple; no masses or thyromegaly. No significant cervical adenopathy. Lungs:  Clear throughout to auscultation.   No wheezes, crackles, or rhonchi. No acute distress. Heart:  Regular rate and rhythm; no murmurs, clicks, rubs,  or gallops. Abdomen: Non-distended, normal bowel sounds.  Soft and nontender without appreciable mass or hepatosplenomegaly.  Pulses:  Normal pulses noted. Extremities:  Without clubbing or edema.  Impression/Plan:  Pleasant 75 year old sibling appears somewhat younger than his stated chronological age. For scheduling of a surveillance colonoscopy given history of  colonic adenoma.The risks, benefits, limitations, alternatives and imponderables have been reviewed with the patient. Questions have been answered. All parties are agreeable.   We'll go ahead and e-prescribe promethazine 25 mg tablets; we will dispense 30 - one every 8 hours as needed for nausea with no refills.

## 2012-06-05 NOTE — Patient Instructions (Signed)
Schedule surveillance colonoscopy - history of polyps  Prescription for promethazine 25 mg as needed for nausea

## 2012-06-11 DIAGNOSIS — M129 Arthropathy, unspecified: Secondary | ICD-10-CM | POA: Diagnosis not present

## 2012-06-11 DIAGNOSIS — M353 Polymyalgia rheumatica: Secondary | ICD-10-CM | POA: Diagnosis not present

## 2012-06-11 DIAGNOSIS — J0100 Acute maxillary sinusitis, unspecified: Secondary | ICD-10-CM | POA: Diagnosis not present

## 2012-06-24 ENCOUNTER — Encounter (HOSPITAL_COMMUNITY): Payer: Self-pay | Admitting: Pharmacy Technician

## 2012-06-26 DIAGNOSIS — Z23 Encounter for immunization: Secondary | ICD-10-CM | POA: Diagnosis not present

## 2012-07-02 ENCOUNTER — Ambulatory Visit (HOSPITAL_COMMUNITY)
Admission: RE | Admit: 2012-07-02 | Discharge: 2012-07-02 | Disposition: A | Discharge status: Home / Self Care | Payer: Medicare Other | Source: Ambulatory Visit | Attending: Internal Medicine | Admitting: Internal Medicine

## 2012-07-02 ENCOUNTER — Encounter (HOSPITAL_COMMUNITY): Payer: Self-pay | Admitting: *Deleted

## 2012-07-02 ENCOUNTER — Encounter (HOSPITAL_COMMUNITY)
Admission: RE | Disposition: A | Discharge status: Home / Self Care | Payer: Self-pay | Source: Ambulatory Visit | Attending: Internal Medicine

## 2012-07-02 DIAGNOSIS — D126 Benign neoplasm of colon, unspecified: Secondary | ICD-10-CM | POA: Insufficient documentation

## 2012-07-02 DIAGNOSIS — Z860100 Personal history of colon polyps, unspecified: Secondary | ICD-10-CM

## 2012-07-02 DIAGNOSIS — Z1211 Encounter for screening for malignant neoplasm of colon: Secondary | ICD-10-CM | POA: Diagnosis not present

## 2012-07-02 HISTORY — PX: COLONOSCOPY: SHX5424

## 2012-07-02 SURGERY — COLONOSCOPY
Anesthesia: Moderate Sedation

## 2012-07-02 MED ORDER — STERILE WATER FOR IRRIGATION IR SOLN
Status: DC | PRN
  Administered 2012-07-02: 08:00:00

## 2012-07-02 MED ORDER — MIDAZOLAM HCL 5 MG/5ML IJ SOLN
INTRAMUSCULAR | Status: AC
  Filled 2012-07-02: qty 10

## 2012-07-02 MED ORDER — MEPERIDINE HCL 100 MG/ML IJ SOLN
INTRAMUSCULAR | Status: DC | PRN
  Administered 2012-07-02: 50 mg via INTRAVENOUS
  Administered 2012-07-02: 25 mg via INTRAVENOUS

## 2012-07-02 MED ORDER — MEPERIDINE HCL 100 MG/ML IJ SOLN
INTRAMUSCULAR | Status: AC
  Filled 2012-07-02: qty 1

## 2012-07-02 MED ORDER — MIDAZOLAM HCL 5 MG/5ML IJ SOLN
INTRAMUSCULAR | Status: DC | PRN
  Administered 2012-07-02: 2 mg via INTRAVENOUS
  Administered 2012-07-02 (×2): 1 mg via INTRAVENOUS

## 2012-07-02 MED ORDER — SODIUM CHLORIDE 0.45 % IV SOLN
INTRAVENOUS | Status: DC
  Administered 2012-07-02: 08:00:00 via INTRAVENOUS

## 2012-07-02 NOTE — Op Note (Signed)
[facility] [address] [place], [address]   COLONOSCOPY PROCEDURE REPORT  PATIENT: Joshua Strong, Joshua Strong  MR#:         [id number] BIRTHDATE: 1931/09/13 , 81  yrs. old GENDER: Male ENDOSCOPIST: R.  Roetta Sessions, MD FACP FACG REFERRED BY:  Fara Chute, M.D. PROCEDURE DATE:  07/02/2012 PROCEDURE:     colonoscopy with multiple snare polypectomies  INDICATIONS: history of colonic adenoma  INFORMED CONSENT:  The risks, benefits, alternatives and imponderables including but not limited to bleeding, perforation as well as the possibility of a missed lesion have been reviewed.  The potential for biopsy, lesion removal, etc. have also been discussed.  Questions have been answered.  All parties agreeable. Please see the history and physical in the medical record for more information.  MEDICATIONS: Versed 5 mg IV and Versed 5 mg IV in divided doses.  DESCRIPTION OF PROCEDURE:  After a digital rectal exam was performed, the EC-[id number] ([id number])  colonoscope was advanced from the anus through the rectum and colon to the area of the cecum, ileocecal valve and appendiceal orifice.  The cecum was deeply intubated.  These structures were well-seen and photographed for the record.  From the level of the cecum and ileocecal valve, the scope was slowly and cautiously withdrawn.  The mucosal surfaces were carefully surveyed utilizing scope tip deflection to facilitate fold flattening as needed.  The scope was pulled down into the rectum where a thorough examination including retroflexion was performed.    FINDINGS:  the preparation. Normal rectum. (3) 5-7 mm pedunculated polyps; one in the mid sigmoid segment and 2 in the ascending segment;  Remainder of the colonic mucosa appeared normal.  THERAPEUTIC / DIAGNOSTIC MANEUVERS PERFORMED:  the above-mentioned polyps were all removed with either hot or cold snare technique  COMPLICATIONS: none  CECAL WITHDRAWAL TIME:  9  minutes  IMPRESSION:  Multiple colonic polyps-removed as described above  RECOMMENDATIONS: Followup on pathology   _______________________________ eSigned:  R. Roetta Sessions, MD FACP [facility] 07/02/2012 8:58 AM   CC:

## 2012-07-02 NOTE — Progress Notes (Signed)
Epic down vital signs not crossing over, strip printed and taped to progress note. Vitals stable throughout procedure

## 2012-07-02 NOTE — H&P (View-Only) (Signed)
Primary Care Physician:  SASSER,PAUL W, MD Primary Gastroenterologist:  Dr. Rourk  Pre-Procedure History & Physical: HPI:  A Joshua Strong is a 78 y.o. male here for followup colonic adenoma schedule surveillance colonoscopy. Colonic adenoma removed from his ascending colon back in 2008. Has occasional constipation managed with MiraLax. Has not had any significant constipation-occasional management MiraLax. History of chronic pancreatitis. Saw Dr. Howerton at [facility]. Do not recommend surgery. He's done well from a standpoint of his pancreatitis. Has had some rheumatological issues now on prednisone for presumed PMR. He is to see a rheumatologist [facility].  He gets occasionally nauseated and takes promethazine. He is going to be traveling extensively here in the coming weeks as he given a prescription for promethazine tablets for when necessary use. Past Medical History  Diagnosis Date  . GERD (gastroesophageal reflux disease)   . Pancreatitis   . Renal cell carcinoma   . Tubular adenoma   . GI bleed     ulcer  . Hemorrhoid     Past Surgical History  Procedure Date  . Spinal fusion   . Cholecystectomy   . Testicular tumor   . Hand surgery   . Pancreatic duct stent   . Knee surgery     right  . Colonoscopy 06/16/2007    Dr. Rourk- minimal internal hemorrhoids o/w normal rectum,tubular adenoma  . Shoulder surgery     left    Prior to Admission medications   Medication Sig Start Date End Date Taking? Authorizing Provider  aspirin 81 MG tablet Take 81 mg by mouth daily.   Yes Historical Provider, MD  calcium citrate-vitamin D (CITRACAL+D) 315-200 MG-UNIT per tablet Take 1 tablet by mouth daily.   Yes Historical Provider, MD  HYDROcodone-acetaminophen (VICODIN) 5-500 MG per tablet Take 1 tablet by mouth every 6 (six) hours as needed.   Yes Historical Provider, MD  Multiple Vitamin (MULTIVITAMIN) capsule Take 1 capsule by mouth daily.   Yes Historical Provider, MD  predniSONE  (DELTASONE) 10 MG tablet Take 10 mg by mouth daily.   Yes Historical Provider, MD  Probiotic Product (ALIGN) 4 MG CAPS Take 4 mg by mouth daily.   Yes Historical Provider, MD    Allergies as of 06/05/2012 - Review Complete 06/05/2012  Allergen Reaction Noted  . Metoclopramide hcl      No family history on file.  History   Social History  . Marital Status: Married    Spouse Name: N/A    Number of Children: N/A  . Years of Education: N/A   Occupational History  . Not on file.   Social History Main Topics  . Smoking status: Former Smoker -- 0.5 packs/day    Types: Cigarettes  . Smokeless tobacco: Not on file   Comment: quit in 1958  . Alcohol Use: No  . Drug Use: No  . Sexually Active: Not on file   Other Topics Concern  . Not on file   Social History Narrative  . No narrative on file    Review of Systems: See HPI, otherwise negative ROS  Physical Exam: BP 119/70  Pulse 64  Temp 98.3 F (36.8 C) (Temporal)  Ht 5' 6" (1.676 m)  Wt 174 lb 6.4 oz (79.107 kg)  BMI 28.15 kg/m2 General:   Alert,  Well-developed, well-nourished, pleasant and cooperative in NAD Skin:  Intact without significant lesions or rashes. Eyes:  Sclera clear, no icterus.   Conjunctiva pink. Ears:  Normal auditory acuity. Nose:  No deformity, discharge,    or lesions. Mouth:  No deformity or lesions. Neck:  Supple; no masses or thyromegaly. No significant cervical adenopathy. Lungs:  Clear throughout to auscultation.   No wheezes, crackles, or rhonchi. No acute distress. Heart:  Regular rate and rhythm; no murmurs, clicks, rubs,  or gallops. Abdomen: Non-distended, normal bowel sounds.  Soft and nontender without appreciable mass or hepatosplenomegaly.  Pulses:  Normal pulses noted. Extremities:  Without clubbing or edema.  Impression/Plan:  Pleasant 78-year-old sibling appears somewhat younger than his stated chronological age. For scheduling of a surveillance colonoscopy given history of  colonic adenoma.The risks, benefits, limitations, alternatives and imponderables have been reviewed with the patient. Questions have been answered. All parties are agreeable.   We'll go ahead and e-prescribe promethazine 25 mg tablets; we will dispense 30 - one every 8 hours as needed for nausea with no refills. 

## 2012-07-02 NOTE — Interval H&P Note (Signed)
History and Physical Interval Note:  07/02/2012 8:25 AM  Joshua Strong  has presented today for surgery, with the diagnosis of History of colon Adenomas  The various methods of treatment have been discussed with the patient and family. After consideration of risks, benefits and other options for treatment, the patient has consented to  Procedure(s) (LRB) with comments: COLONOSCOPY ([name]) - 8:25 as Joshua surgical intervention .  The patient's history has been reviewed, patient examined, no change in status, stable for surgery.  I have reviewed the patient's chart and labs.  Questions were answered to the patient's satisfaction.     Eula Listen

## 2012-07-03 ENCOUNTER — Encounter: Payer: Self-pay | Admitting: Internal Medicine

## 2012-07-08 ENCOUNTER — Encounter (HOSPITAL_COMMUNITY): Payer: Self-pay | Admitting: Internal Medicine

## 2012-07-09 ENCOUNTER — Encounter: Payer: Self-pay | Admitting: *Deleted

## 2012-07-09 DIAGNOSIS — K859 Acute pancreatitis without necrosis or infection, unspecified: Secondary | ICD-10-CM | POA: Diagnosis not present

## 2012-07-09 DIAGNOSIS — K861 Other chronic pancreatitis: Secondary | ICD-10-CM | POA: Diagnosis not present

## 2012-07-09 DIAGNOSIS — M353 Polymyalgia rheumatica: Secondary | ICD-10-CM | POA: Diagnosis not present

## 2012-07-09 DIAGNOSIS — R109 Unspecified abdominal pain: Secondary | ICD-10-CM | POA: Diagnosis not present

## 2012-07-09 DIAGNOSIS — M129 Arthropathy, unspecified: Secondary | ICD-10-CM | POA: Diagnosis not present

## 2012-07-09 DIAGNOSIS — R0789 Other chest pain: Secondary | ICD-10-CM | POA: Diagnosis not present

## 2012-07-21 DIAGNOSIS — M353 Polymyalgia rheumatica: Secondary | ICD-10-CM | POA: Diagnosis not present

## 2012-08-10 DIAGNOSIS — M810 Age-related osteoporosis without current pathological fracture: Secondary | ICD-10-CM | POA: Diagnosis not present

## 2012-08-12 DIAGNOSIS — M810 Age-related osteoporosis without current pathological fracture: Secondary | ICD-10-CM | POA: Diagnosis not present

## 2012-08-12 DIAGNOSIS — C649 Malignant neoplasm of unspecified kidney, except renal pelvis: Secondary | ICD-10-CM | POA: Diagnosis not present

## 2012-08-12 DIAGNOSIS — M353 Polymyalgia rheumatica: Secondary | ICD-10-CM | POA: Diagnosis not present

## 2012-10-12 ENCOUNTER — Ambulatory Visit
Admission: RE | Admit: 2012-10-12 | Discharge: 2012-10-12 | Disposition: A | Discharge status: Home / Self Care | Payer: Medicare Other | Source: Ambulatory Visit | Attending: Urology | Admitting: Urology

## 2012-10-12 ENCOUNTER — Other Ambulatory Visit: Payer: Self-pay | Admitting: Urology

## 2012-10-12 DIAGNOSIS — C649 Malignant neoplasm of unspecified kidney, except renal pelvis: Secondary | ICD-10-CM

## 2012-10-12 DIAGNOSIS — N402 Nodular prostate without lower urinary tract symptoms: Secondary | ICD-10-CM | POA: Diagnosis not present

## 2012-10-12 DIAGNOSIS — R918 Other nonspecific abnormal finding of lung field: Secondary | ICD-10-CM | POA: Diagnosis not present

## 2012-10-12 DIAGNOSIS — N400 Enlarged prostate without lower urinary tract symptoms: Secondary | ICD-10-CM | POA: Diagnosis not present

## 2012-10-12 DIAGNOSIS — R972 Elevated prostate specific antigen [PSA]: Secondary | ICD-10-CM | POA: Diagnosis not present

## 2012-10-12 DIAGNOSIS — Z85528 Personal history of other malignant neoplasm of kidney: Secondary | ICD-10-CM | POA: Diagnosis not present

## 2012-10-14 DIAGNOSIS — K861 Other chronic pancreatitis: Secondary | ICD-10-CM | POA: Diagnosis not present

## 2012-10-14 DIAGNOSIS — M353 Polymyalgia rheumatica: Secondary | ICD-10-CM | POA: Diagnosis not present

## 2012-10-23 DIAGNOSIS — K859 Acute pancreatitis without necrosis or infection, unspecified: Secondary | ICD-10-CM | POA: Diagnosis not present

## 2012-10-23 DIAGNOSIS — R0789 Other chest pain: Secondary | ICD-10-CM | POA: Diagnosis not present

## 2012-10-30 DIAGNOSIS — R109 Unspecified abdominal pain: Secondary | ICD-10-CM | POA: Diagnosis not present

## 2012-10-30 DIAGNOSIS — K861 Other chronic pancreatitis: Secondary | ICD-10-CM | POA: Diagnosis not present

## 2012-11-03 ENCOUNTER — Encounter (INDEPENDENT_AMBULATORY_CARE_PROVIDER_SITE_OTHER): Payer: Medicare Other | Admitting: Ophthalmology

## 2012-11-03 DIAGNOSIS — H33309 Unspecified retinal break, unspecified eye: Secondary | ICD-10-CM

## 2012-11-03 DIAGNOSIS — H35379 Puckering of macula, unspecified eye: Secondary | ICD-10-CM

## 2012-11-03 DIAGNOSIS — H43819 Vitreous degeneration, unspecified eye: Secondary | ICD-10-CM

## 2012-11-17 DIAGNOSIS — H40059 Ocular hypertension, unspecified eye: Secondary | ICD-10-CM | POA: Diagnosis not present

## 2012-11-17 DIAGNOSIS — B356 Tinea cruris: Secondary | ICD-10-CM | POA: Diagnosis not present

## 2013-01-12 DIAGNOSIS — M353 Polymyalgia rheumatica: Secondary | ICD-10-CM | POA: Diagnosis not present

## 2013-01-12 DIAGNOSIS — K861 Other chronic pancreatitis: Secondary | ICD-10-CM | POA: Diagnosis not present

## 2013-01-12 DIAGNOSIS — M810 Age-related osteoporosis without current pathological fracture: Secondary | ICD-10-CM | POA: Diagnosis not present

## 2013-02-05 DIAGNOSIS — R109 Unspecified abdominal pain: Secondary | ICD-10-CM | POA: Diagnosis not present

## 2013-02-05 DIAGNOSIS — K861 Other chronic pancreatitis: Secondary | ICD-10-CM | POA: Diagnosis not present

## 2013-02-08 DIAGNOSIS — H40059 Ocular hypertension, unspecified eye: Secondary | ICD-10-CM | POA: Diagnosis not present

## 2013-02-12 DIAGNOSIS — M810 Age-related osteoporosis without current pathological fracture: Secondary | ICD-10-CM | POA: Diagnosis not present

## 2013-02-17 DIAGNOSIS — M353 Polymyalgia rheumatica: Secondary | ICD-10-CM | POA: Diagnosis not present

## 2013-02-17 DIAGNOSIS — R609 Edema, unspecified: Secondary | ICD-10-CM | POA: Diagnosis not present

## 2013-02-23 DIAGNOSIS — J019 Acute sinusitis, unspecified: Secondary | ICD-10-CM | POA: Diagnosis not present

## 2013-02-23 DIAGNOSIS — M353 Polymyalgia rheumatica: Secondary | ICD-10-CM | POA: Diagnosis not present

## 2013-02-23 DIAGNOSIS — K861 Other chronic pancreatitis: Secondary | ICD-10-CM | POA: Diagnosis not present

## 2013-02-24 DIAGNOSIS — I059 Rheumatic mitral valve disease, unspecified: Secondary | ICD-10-CM | POA: Diagnosis not present

## 2013-02-24 DIAGNOSIS — I079 Rheumatic tricuspid valve disease, unspecified: Secondary | ICD-10-CM | POA: Diagnosis not present

## 2013-02-24 DIAGNOSIS — R609 Edema, unspecified: Secondary | ICD-10-CM

## 2013-03-16 DIAGNOSIS — J209 Acute bronchitis, unspecified: Secondary | ICD-10-CM | POA: Diagnosis not present

## 2013-03-16 DIAGNOSIS — R05 Cough: Secondary | ICD-10-CM | POA: Diagnosis not present

## 2013-04-13 DIAGNOSIS — M810 Age-related osteoporosis without current pathological fracture: Secondary | ICD-10-CM | POA: Diagnosis not present

## 2013-04-13 DIAGNOSIS — Z8719 Personal history of other diseases of the digestive system: Secondary | ICD-10-CM | POA: Diagnosis not present

## 2013-04-13 DIAGNOSIS — M353 Polymyalgia rheumatica: Secondary | ICD-10-CM | POA: Diagnosis not present

## 2013-04-13 DIAGNOSIS — Z79899 Other long term (current) drug therapy: Secondary | ICD-10-CM | POA: Diagnosis not present

## 2013-06-07 DIAGNOSIS — L259 Unspecified contact dermatitis, unspecified cause: Secondary | ICD-10-CM | POA: Diagnosis not present

## 2013-07-09 DIAGNOSIS — Z23 Encounter for immunization: Secondary | ICD-10-CM | POA: Diagnosis not present

## 2013-08-12 DIAGNOSIS — H402290 Chronic angle-closure glaucoma, unspecified eye, stage unspecified: Secondary | ICD-10-CM | POA: Diagnosis not present

## 2013-09-07 DIAGNOSIS — M79609 Pain in unspecified limb: Secondary | ICD-10-CM | POA: Diagnosis not present

## 2013-09-07 DIAGNOSIS — L600 Ingrowing nail: Secondary | ICD-10-CM | POA: Diagnosis not present

## 2013-09-07 DIAGNOSIS — B351 Tinea unguium: Secondary | ICD-10-CM | POA: Diagnosis not present

## 2013-09-10 DIAGNOSIS — IMO0002 Reserved for concepts with insufficient information to code with codable children: Secondary | ICD-10-CM | POA: Diagnosis not present

## 2013-09-10 DIAGNOSIS — K861 Other chronic pancreatitis: Secondary | ICD-10-CM | POA: Diagnosis not present

## 2013-09-15 DIAGNOSIS — M810 Age-related osteoporosis without current pathological fracture: Secondary | ICD-10-CM | POA: Diagnosis not present

## 2013-09-15 DIAGNOSIS — M47817 Spondylosis without myelopathy or radiculopathy, lumbosacral region: Secondary | ICD-10-CM | POA: Diagnosis not present

## 2013-09-15 DIAGNOSIS — M169 Osteoarthritis of hip, unspecified: Secondary | ICD-10-CM | POA: Diagnosis not present

## 2013-09-15 DIAGNOSIS — R935 Abnormal findings on diagnostic imaging of other abdominal regions, including retroperitoneum: Secondary | ICD-10-CM | POA: Diagnosis not present

## 2013-09-15 DIAGNOSIS — M353 Polymyalgia rheumatica: Secondary | ICD-10-CM | POA: Diagnosis not present

## 2013-09-15 DIAGNOSIS — M1990 Unspecified osteoarthritis, unspecified site: Secondary | ICD-10-CM | POA: Diagnosis not present

## 2013-09-21 DIAGNOSIS — M1990 Unspecified osteoarthritis, unspecified site: Secondary | ICD-10-CM | POA: Diagnosis not present

## 2013-09-21 DIAGNOSIS — M353 Polymyalgia rheumatica: Secondary | ICD-10-CM | POA: Diagnosis not present

## 2013-09-27 DIAGNOSIS — M353 Polymyalgia rheumatica: Secondary | ICD-10-CM | POA: Diagnosis not present

## 2013-11-02 DIAGNOSIS — M1710 Unilateral primary osteoarthritis, unspecified knee: Secondary | ICD-10-CM | POA: Diagnosis not present

## 2013-11-02 DIAGNOSIS — M5430 Sciatica, unspecified side: Secondary | ICD-10-CM | POA: Diagnosis not present

## 2013-11-02 DIAGNOSIS — M5137 Other intervertebral disc degeneration, lumbosacral region: Secondary | ICD-10-CM | POA: Diagnosis not present

## 2013-11-02 DIAGNOSIS — M25559 Pain in unspecified hip: Secondary | ICD-10-CM | POA: Diagnosis not present

## 2013-11-13 DIAGNOSIS — M47817 Spondylosis without myelopathy or radiculopathy, lumbosacral region: Secondary | ICD-10-CM | POA: Diagnosis not present

## 2013-11-16 DIAGNOSIS — M5430 Sciatica, unspecified side: Secondary | ICD-10-CM | POA: Diagnosis not present

## 2013-11-18 DIAGNOSIS — M48061 Spinal stenosis, lumbar region without neurogenic claudication: Secondary | ICD-10-CM | POA: Diagnosis not present

## 2013-11-18 DIAGNOSIS — M961 Postlaminectomy syndrome, not elsewhere classified: Secondary | ICD-10-CM | POA: Diagnosis not present

## 2013-11-18 DIAGNOSIS — IMO0002 Reserved for concepts with insufficient information to code with codable children: Secondary | ICD-10-CM | POA: Diagnosis not present

## 2013-11-22 DIAGNOSIS — H40059 Ocular hypertension, unspecified eye: Secondary | ICD-10-CM | POA: Diagnosis not present

## 2013-12-02 DIAGNOSIS — M25559 Pain in unspecified hip: Secondary | ICD-10-CM | POA: Diagnosis not present

## 2013-12-14 DIAGNOSIS — Z79899 Other long term (current) drug therapy: Secondary | ICD-10-CM | POA: Diagnosis not present

## 2013-12-14 DIAGNOSIS — Z7982 Long term (current) use of aspirin: Secondary | ICD-10-CM | POA: Diagnosis not present

## 2013-12-14 DIAGNOSIS — M353 Polymyalgia rheumatica: Secondary | ICD-10-CM | POA: Diagnosis not present

## 2013-12-16 DIAGNOSIS — J019 Acute sinusitis, unspecified: Secondary | ICD-10-CM | POA: Diagnosis not present

## 2014-02-04 DIAGNOSIS — K219 Gastro-esophageal reflux disease without esophagitis: Secondary | ICD-10-CM | POA: Diagnosis not present

## 2014-02-04 DIAGNOSIS — K861 Other chronic pancreatitis: Secondary | ICD-10-CM | POA: Diagnosis not present

## 2014-02-15 DIAGNOSIS — M25579 Pain in unspecified ankle and joints of unspecified foot: Secondary | ICD-10-CM | POA: Diagnosis not present

## 2014-02-15 DIAGNOSIS — M79609 Pain in unspecified limb: Secondary | ICD-10-CM | POA: Diagnosis not present

## 2014-02-17 DIAGNOSIS — H40059 Ocular hypertension, unspecified eye: Secondary | ICD-10-CM | POA: Diagnosis not present

## 2014-03-28 ENCOUNTER — Encounter (INDEPENDENT_AMBULATORY_CARE_PROVIDER_SITE_OTHER): Payer: Medicare Other | Admitting: Ophthalmology

## 2014-03-28 DIAGNOSIS — H35379 Puckering of macula, unspecified eye: Secondary | ICD-10-CM

## 2014-03-28 DIAGNOSIS — H4310 Vitreous hemorrhage, unspecified eye: Secondary | ICD-10-CM

## 2014-03-28 DIAGNOSIS — H43819 Vitreous degeneration, unspecified eye: Secondary | ICD-10-CM | POA: Diagnosis not present

## 2014-04-14 DIAGNOSIS — K861 Other chronic pancreatitis: Secondary | ICD-10-CM | POA: Diagnosis not present

## 2014-04-14 DIAGNOSIS — M353 Polymyalgia rheumatica: Secondary | ICD-10-CM | POA: Diagnosis not present

## 2014-04-14 DIAGNOSIS — M810 Age-related osteoporosis without current pathological fracture: Secondary | ICD-10-CM | POA: Diagnosis not present

## 2014-05-09 ENCOUNTER — Encounter (INDEPENDENT_AMBULATORY_CARE_PROVIDER_SITE_OTHER): Payer: Medicare Other | Admitting: Ophthalmology

## 2014-05-09 DIAGNOSIS — H35359 Cystoid macular degeneration, unspecified eye: Secondary | ICD-10-CM | POA: Diagnosis not present

## 2014-05-09 DIAGNOSIS — H2700 Aphakia, unspecified eye: Secondary | ICD-10-CM

## 2014-05-09 DIAGNOSIS — H4310 Vitreous hemorrhage, unspecified eye: Secondary | ICD-10-CM

## 2014-05-09 DIAGNOSIS — H43819 Vitreous degeneration, unspecified eye: Secondary | ICD-10-CM | POA: Diagnosis not present

## 2014-05-13 DIAGNOSIS — M810 Age-related osteoporosis without current pathological fracture: Secondary | ICD-10-CM | POA: Diagnosis not present

## 2014-06-27 DIAGNOSIS — IMO0002 Reserved for concepts with insufficient information to code with codable children: Secondary | ICD-10-CM | POA: Diagnosis not present

## 2014-07-05 DIAGNOSIS — Z23 Encounter for immunization: Secondary | ICD-10-CM | POA: Diagnosis not present

## 2014-07-11 ENCOUNTER — Encounter (INDEPENDENT_AMBULATORY_CARE_PROVIDER_SITE_OTHER): Payer: Medicare Other | Admitting: Ophthalmology

## 2014-07-11 DIAGNOSIS — H33302 Unspecified retinal break, left eye: Secondary | ICD-10-CM

## 2014-07-11 DIAGNOSIS — H59032 Cystoid macular edema following cataract surgery, left eye: Secondary | ICD-10-CM | POA: Diagnosis not present

## 2014-07-11 DIAGNOSIS — H43813 Vitreous degeneration, bilateral: Secondary | ICD-10-CM | POA: Diagnosis not present

## 2014-07-11 DIAGNOSIS — H35372 Puckering of macula, left eye: Secondary | ICD-10-CM

## 2014-07-14 DIAGNOSIS — M5116 Intervertebral disc disorders with radiculopathy, lumbar region: Secondary | ICD-10-CM | POA: Diagnosis not present

## 2014-07-14 DIAGNOSIS — M961 Postlaminectomy syndrome, not elsewhere classified: Secondary | ICD-10-CM | POA: Diagnosis not present

## 2014-07-14 DIAGNOSIS — M4806 Spinal stenosis, lumbar region: Secondary | ICD-10-CM | POA: Diagnosis not present

## 2014-07-19 DIAGNOSIS — Z23 Encounter for immunization: Secondary | ICD-10-CM | POA: Diagnosis not present

## 2014-07-21 DIAGNOSIS — M353 Polymyalgia rheumatica: Secondary | ICD-10-CM | POA: Diagnosis not present

## 2014-08-16 DIAGNOSIS — H40053 Ocular hypertension, bilateral: Secondary | ICD-10-CM | POA: Diagnosis not present

## 2014-11-02 DIAGNOSIS — M961 Postlaminectomy syndrome, not elsewhere classified: Secondary | ICD-10-CM | POA: Diagnosis not present

## 2014-11-02 DIAGNOSIS — M4726 Other spondylosis with radiculopathy, lumbar region: Secondary | ICD-10-CM | POA: Diagnosis not present

## 2014-11-17 DIAGNOSIS — M8580 Other specified disorders of bone density and structure, unspecified site: Secondary | ICD-10-CM | POA: Diagnosis not present

## 2014-11-18 DIAGNOSIS — M47816 Spondylosis without myelopathy or radiculopathy, lumbar region: Secondary | ICD-10-CM | POA: Diagnosis not present

## 2014-11-18 DIAGNOSIS — R262 Difficulty in walking, not elsewhere classified: Secondary | ICD-10-CM | POA: Diagnosis not present

## 2014-11-18 DIAGNOSIS — M6281 Muscle weakness (generalized): Secondary | ICD-10-CM | POA: Diagnosis not present

## 2014-11-18 DIAGNOSIS — M545 Low back pain: Secondary | ICD-10-CM | POA: Diagnosis not present

## 2014-11-21 DIAGNOSIS — M6281 Muscle weakness (generalized): Secondary | ICD-10-CM | POA: Diagnosis not present

## 2014-11-21 DIAGNOSIS — M47816 Spondylosis without myelopathy or radiculopathy, lumbar region: Secondary | ICD-10-CM | POA: Diagnosis not present

## 2014-11-21 DIAGNOSIS — M545 Low back pain: Secondary | ICD-10-CM | POA: Diagnosis not present

## 2014-11-21 DIAGNOSIS — R262 Difficulty in walking, not elsewhere classified: Secondary | ICD-10-CM | POA: Diagnosis not present

## 2014-11-23 DIAGNOSIS — M810 Age-related osteoporosis without current pathological fracture: Secondary | ICD-10-CM | POA: Diagnosis not present

## 2014-11-23 DIAGNOSIS — Z79899 Other long term (current) drug therapy: Secondary | ICD-10-CM | POA: Diagnosis not present

## 2014-11-24 DIAGNOSIS — M47816 Spondylosis without myelopathy or radiculopathy, lumbar region: Secondary | ICD-10-CM | POA: Diagnosis not present

## 2014-11-24 DIAGNOSIS — M6281 Muscle weakness (generalized): Secondary | ICD-10-CM | POA: Diagnosis not present

## 2014-11-24 DIAGNOSIS — M545 Low back pain: Secondary | ICD-10-CM | POA: Diagnosis not present

## 2014-11-24 DIAGNOSIS — R262 Difficulty in walking, not elsewhere classified: Secondary | ICD-10-CM | POA: Diagnosis not present

## 2014-11-25 DIAGNOSIS — M6281 Muscle weakness (generalized): Secondary | ICD-10-CM | POA: Diagnosis not present

## 2014-11-25 DIAGNOSIS — R262 Difficulty in walking, not elsewhere classified: Secondary | ICD-10-CM | POA: Diagnosis not present

## 2014-11-25 DIAGNOSIS — M47816 Spondylosis without myelopathy or radiculopathy, lumbar region: Secondary | ICD-10-CM | POA: Diagnosis not present

## 2014-11-25 DIAGNOSIS — M545 Low back pain: Secondary | ICD-10-CM | POA: Diagnosis not present

## 2014-11-28 DIAGNOSIS — M545 Low back pain: Secondary | ICD-10-CM | POA: Diagnosis not present

## 2014-11-28 DIAGNOSIS — R262 Difficulty in walking, not elsewhere classified: Secondary | ICD-10-CM | POA: Diagnosis not present

## 2014-11-28 DIAGNOSIS — M47816 Spondylosis without myelopathy or radiculopathy, lumbar region: Secondary | ICD-10-CM | POA: Diagnosis not present

## 2014-11-28 DIAGNOSIS — M6281 Muscle weakness (generalized): Secondary | ICD-10-CM | POA: Diagnosis not present

## 2014-11-30 DIAGNOSIS — M545 Low back pain: Secondary | ICD-10-CM | POA: Diagnosis not present

## 2014-11-30 DIAGNOSIS — M6281 Muscle weakness (generalized): Secondary | ICD-10-CM | POA: Diagnosis not present

## 2014-11-30 DIAGNOSIS — M47816 Spondylosis without myelopathy or radiculopathy, lumbar region: Secondary | ICD-10-CM | POA: Diagnosis not present

## 2014-11-30 DIAGNOSIS — R262 Difficulty in walking, not elsewhere classified: Secondary | ICD-10-CM | POA: Diagnosis not present

## 2014-12-02 DIAGNOSIS — M545 Low back pain: Secondary | ICD-10-CM | POA: Diagnosis not present

## 2014-12-02 DIAGNOSIS — M6281 Muscle weakness (generalized): Secondary | ICD-10-CM | POA: Diagnosis not present

## 2014-12-02 DIAGNOSIS — R262 Difficulty in walking, not elsewhere classified: Secondary | ICD-10-CM | POA: Diagnosis not present

## 2014-12-02 DIAGNOSIS — M47816 Spondylosis without myelopathy or radiculopathy, lumbar region: Secondary | ICD-10-CM | POA: Diagnosis not present

## 2014-12-05 DIAGNOSIS — M47816 Spondylosis without myelopathy or radiculopathy, lumbar region: Secondary | ICD-10-CM | POA: Diagnosis not present

## 2014-12-05 DIAGNOSIS — M6281 Muscle weakness (generalized): Secondary | ICD-10-CM | POA: Diagnosis not present

## 2014-12-05 DIAGNOSIS — R262 Difficulty in walking, not elsewhere classified: Secondary | ICD-10-CM | POA: Diagnosis not present

## 2014-12-05 DIAGNOSIS — M545 Low back pain: Secondary | ICD-10-CM | POA: Diagnosis not present

## 2014-12-07 DIAGNOSIS — M6281 Muscle weakness (generalized): Secondary | ICD-10-CM | POA: Diagnosis not present

## 2014-12-07 DIAGNOSIS — M47816 Spondylosis without myelopathy or radiculopathy, lumbar region: Secondary | ICD-10-CM | POA: Diagnosis not present

## 2014-12-07 DIAGNOSIS — M545 Low back pain: Secondary | ICD-10-CM | POA: Diagnosis not present

## 2014-12-07 DIAGNOSIS — R262 Difficulty in walking, not elsewhere classified: Secondary | ICD-10-CM | POA: Diagnosis not present

## 2014-12-13 DIAGNOSIS — M6281 Muscle weakness (generalized): Secondary | ICD-10-CM | POA: Diagnosis not present

## 2014-12-13 DIAGNOSIS — M545 Low back pain: Secondary | ICD-10-CM | POA: Diagnosis not present

## 2014-12-13 DIAGNOSIS — R262 Difficulty in walking, not elsewhere classified: Secondary | ICD-10-CM | POA: Diagnosis not present

## 2014-12-13 DIAGNOSIS — M47816 Spondylosis without myelopathy or radiculopathy, lumbar region: Secondary | ICD-10-CM | POA: Diagnosis not present

## 2015-01-19 DIAGNOSIS — R079 Chest pain, unspecified: Secondary | ICD-10-CM | POA: Diagnosis not present

## 2015-01-19 DIAGNOSIS — M47816 Spondylosis without myelopathy or radiculopathy, lumbar region: Secondary | ICD-10-CM | POA: Diagnosis not present

## 2015-01-19 DIAGNOSIS — M438X3 Other specified deforming dorsopathies, cervicothoracic region: Secondary | ICD-10-CM | POA: Diagnosis not present

## 2015-01-19 DIAGNOSIS — M5030 Other cervical disc degeneration, unspecified cervical region: Secondary | ICD-10-CM | POA: Diagnosis not present

## 2015-01-19 DIAGNOSIS — Z87891 Personal history of nicotine dependence: Secondary | ICD-10-CM | POA: Diagnosis not present

## 2015-01-19 DIAGNOSIS — M47812 Spondylosis without myelopathy or radiculopathy, cervical region: Secondary | ICD-10-CM | POA: Diagnosis not present

## 2015-01-19 DIAGNOSIS — M898X8 Other specified disorders of bone, other site: Secondary | ICD-10-CM | POA: Diagnosis not present

## 2015-01-19 DIAGNOSIS — R5383 Other fatigue: Secondary | ICD-10-CM | POA: Diagnosis not present

## 2015-01-19 DIAGNOSIS — Z8739 Personal history of other diseases of the musculoskeletal system and connective tissue: Secondary | ICD-10-CM | POA: Diagnosis not present

## 2015-01-19 DIAGNOSIS — M2550 Pain in unspecified joint: Secondary | ICD-10-CM | POA: Diagnosis not present

## 2015-01-19 DIAGNOSIS — K148 Other diseases of tongue: Secondary | ICD-10-CM | POA: Diagnosis not present

## 2015-01-19 DIAGNOSIS — M8580 Other specified disorders of bone density and structure, unspecified site: Secondary | ICD-10-CM | POA: Diagnosis not present

## 2015-01-19 DIAGNOSIS — M1288 Other specific arthropathies, not elsewhere classified, other specified site: Secondary | ICD-10-CM | POA: Diagnosis not present

## 2015-01-25 DIAGNOSIS — L570 Actinic keratosis: Secondary | ICD-10-CM | POA: Diagnosis not present

## 2015-01-25 DIAGNOSIS — B353 Tinea pedis: Secondary | ICD-10-CM | POA: Diagnosis not present

## 2015-01-25 DIAGNOSIS — D485 Neoplasm of uncertain behavior of skin: Secondary | ICD-10-CM | POA: Diagnosis not present

## 2015-01-25 DIAGNOSIS — D034 Melanoma in situ of scalp and neck: Secondary | ICD-10-CM | POA: Diagnosis not present

## 2015-01-25 DIAGNOSIS — D225 Melanocytic nevi of trunk: Secondary | ICD-10-CM | POA: Diagnosis not present

## 2015-02-02 DIAGNOSIS — C4449 Other specified malignant neoplasm of skin of scalp and neck: Secondary | ICD-10-CM | POA: Diagnosis not present

## 2015-02-02 DIAGNOSIS — C434 Malignant melanoma of scalp and neck: Secondary | ICD-10-CM | POA: Diagnosis not present

## 2015-02-02 DIAGNOSIS — L089 Local infection of the skin and subcutaneous tissue, unspecified: Secondary | ICD-10-CM | POA: Diagnosis not present

## 2015-02-13 DIAGNOSIS — Z4802 Encounter for removal of sutures: Secondary | ICD-10-CM | POA: Diagnosis not present

## 2015-03-13 DIAGNOSIS — M1990 Unspecified osteoarthritis, unspecified site: Secondary | ICD-10-CM | POA: Diagnosis not present

## 2015-03-13 DIAGNOSIS — M8580 Other specified disorders of bone density and structure, unspecified site: Secondary | ICD-10-CM | POA: Diagnosis not present

## 2015-03-13 DIAGNOSIS — M539 Dorsopathy, unspecified: Secondary | ICD-10-CM | POA: Diagnosis not present

## 2015-03-13 DIAGNOSIS — M810 Age-related osteoporosis without current pathological fracture: Secondary | ICD-10-CM | POA: Diagnosis not present

## 2015-03-13 DIAGNOSIS — Z888 Allergy status to other drugs, medicaments and biological substances status: Secondary | ICD-10-CM | POA: Diagnosis not present

## 2015-03-13 DIAGNOSIS — R220 Localized swelling, mass and lump, head: Secondary | ICD-10-CM | POA: Diagnosis not present

## 2015-03-13 DIAGNOSIS — R109 Unspecified abdominal pain: Secondary | ICD-10-CM | POA: Diagnosis not present

## 2015-03-13 DIAGNOSIS — Z87891 Personal history of nicotine dependence: Secondary | ICD-10-CM | POA: Diagnosis not present

## 2015-03-13 DIAGNOSIS — Z7952 Long term (current) use of systemic steroids: Secondary | ICD-10-CM | POA: Diagnosis not present

## 2015-03-13 DIAGNOSIS — Z8739 Personal history of other diseases of the musculoskeletal system and connective tissue: Secondary | ICD-10-CM | POA: Diagnosis not present

## 2015-03-13 DIAGNOSIS — R079 Chest pain, unspecified: Secondary | ICD-10-CM | POA: Diagnosis not present

## 2015-03-13 DIAGNOSIS — M5136 Other intervertebral disc degeneration, lumbar region: Secondary | ICD-10-CM | POA: Diagnosis not present

## 2015-03-13 DIAGNOSIS — Z8719 Personal history of other diseases of the digestive system: Secondary | ICD-10-CM | POA: Diagnosis not present

## 2015-03-13 DIAGNOSIS — R5383 Other fatigue: Secondary | ICD-10-CM | POA: Diagnosis not present

## 2015-03-13 DIAGNOSIS — Z885 Allergy status to narcotic agent status: Secondary | ICD-10-CM | POA: Diagnosis not present

## 2015-03-16 DIAGNOSIS — K861 Other chronic pancreatitis: Secondary | ICD-10-CM | POA: Diagnosis not present

## 2015-04-05 DIAGNOSIS — Z7982 Long term (current) use of aspirin: Secondary | ICD-10-CM | POA: Diagnosis not present

## 2015-04-05 DIAGNOSIS — K862 Cyst of pancreas: Secondary | ICD-10-CM | POA: Diagnosis not present

## 2015-04-05 DIAGNOSIS — N402 Nodular prostate without lower urinary tract symptoms: Secondary | ICD-10-CM | POA: Diagnosis not present

## 2015-04-05 DIAGNOSIS — Z79899 Other long term (current) drug therapy: Secondary | ICD-10-CM | POA: Diagnosis not present

## 2015-04-05 DIAGNOSIS — M810 Age-related osteoporosis without current pathological fracture: Secondary | ICD-10-CM | POA: Diagnosis not present

## 2015-04-05 DIAGNOSIS — Z888 Allergy status to other drugs, medicaments and biological substances status: Secondary | ICD-10-CM | POA: Diagnosis not present

## 2015-04-05 DIAGNOSIS — Z85528 Personal history of other malignant neoplasm of kidney: Secondary | ICD-10-CM | POA: Diagnosis not present

## 2015-04-05 DIAGNOSIS — M353 Polymyalgia rheumatica: Secondary | ICD-10-CM | POA: Diagnosis not present

## 2015-04-05 DIAGNOSIS — Z87891 Personal history of nicotine dependence: Secondary | ICD-10-CM | POA: Diagnosis not present

## 2015-04-05 DIAGNOSIS — Z886 Allergy status to analgesic agent status: Secondary | ICD-10-CM | POA: Diagnosis not present

## 2015-04-05 DIAGNOSIS — K861 Other chronic pancreatitis: Secondary | ICD-10-CM | POA: Diagnosis not present

## 2015-04-05 DIAGNOSIS — Z7952 Long term (current) use of systemic steroids: Secondary | ICD-10-CM | POA: Diagnosis not present

## 2015-04-05 DIAGNOSIS — K219 Gastro-esophageal reflux disease without esophagitis: Secondary | ICD-10-CM | POA: Diagnosis not present

## 2015-04-13 DIAGNOSIS — M8589 Other specified disorders of bone density and structure, multiple sites: Secondary | ICD-10-CM | POA: Diagnosis not present

## 2015-04-13 DIAGNOSIS — E780 Pure hypercholesterolemia: Secondary | ICD-10-CM | POA: Diagnosis not present

## 2015-04-13 DIAGNOSIS — K861 Other chronic pancreatitis: Secondary | ICD-10-CM | POA: Diagnosis not present

## 2015-04-13 DIAGNOSIS — R5383 Other fatigue: Secondary | ICD-10-CM | POA: Diagnosis not present

## 2015-04-19 DIAGNOSIS — R5383 Other fatigue: Secondary | ICD-10-CM | POA: Diagnosis not present

## 2015-04-19 DIAGNOSIS — I517 Cardiomegaly: Secondary | ICD-10-CM | POA: Diagnosis not present

## 2015-04-19 DIAGNOSIS — R931 Abnormal findings on diagnostic imaging of heart and coronary circulation: Secondary | ICD-10-CM | POA: Diagnosis not present

## 2015-04-24 DIAGNOSIS — M961 Postlaminectomy syndrome, not elsewhere classified: Secondary | ICD-10-CM | POA: Diagnosis not present

## 2015-04-24 DIAGNOSIS — M4806 Spinal stenosis, lumbar region: Secondary | ICD-10-CM | POA: Diagnosis not present

## 2015-04-24 DIAGNOSIS — M5416 Radiculopathy, lumbar region: Secondary | ICD-10-CM | POA: Diagnosis not present

## 2015-05-29 DIAGNOSIS — R931 Abnormal findings on diagnostic imaging of heart and coronary circulation: Secondary | ICD-10-CM | POA: Diagnosis not present

## 2015-05-29 DIAGNOSIS — Z789 Other specified health status: Secondary | ICD-10-CM | POA: Diagnosis not present

## 2015-05-29 DIAGNOSIS — R5383 Other fatigue: Secondary | ICD-10-CM | POA: Diagnosis not present

## 2015-05-29 DIAGNOSIS — R9431 Abnormal electrocardiogram [ECG] [EKG]: Secondary | ICD-10-CM | POA: Diagnosis not present

## 2015-06-02 DIAGNOSIS — R5383 Other fatigue: Secondary | ICD-10-CM | POA: Diagnosis not present

## 2015-06-02 DIAGNOSIS — Z789 Other specified health status: Secondary | ICD-10-CM | POA: Diagnosis not present

## 2015-06-02 DIAGNOSIS — I4510 Unspecified right bundle-branch block: Secondary | ICD-10-CM | POA: Diagnosis not present

## 2015-06-02 DIAGNOSIS — R9431 Abnormal electrocardiogram [ECG] [EKG]: Secondary | ICD-10-CM | POA: Diagnosis not present

## 2015-06-02 DIAGNOSIS — R931 Abnormal findings on diagnostic imaging of heart and coronary circulation: Secondary | ICD-10-CM | POA: Diagnosis not present

## 2015-06-15 DIAGNOSIS — Z87891 Personal history of nicotine dependence: Secondary | ICD-10-CM | POA: Diagnosis not present

## 2015-06-15 DIAGNOSIS — M8580 Other specified disorders of bone density and structure, unspecified site: Secondary | ICD-10-CM | POA: Diagnosis not present

## 2015-06-15 DIAGNOSIS — Z23 Encounter for immunization: Secondary | ICD-10-CM | POA: Diagnosis not present

## 2015-06-15 DIAGNOSIS — M5136 Other intervertebral disc degeneration, lumbar region: Secondary | ICD-10-CM | POA: Diagnosis not present

## 2015-06-15 DIAGNOSIS — M1990 Unspecified osteoarthritis, unspecified site: Secondary | ICD-10-CM | POA: Diagnosis not present

## 2015-06-15 DIAGNOSIS — R252 Cramp and spasm: Secondary | ICD-10-CM | POA: Diagnosis not present

## 2015-06-15 DIAGNOSIS — R5383 Other fatigue: Secondary | ICD-10-CM | POA: Diagnosis not present

## 2015-06-15 DIAGNOSIS — R2681 Unsteadiness on feet: Secondary | ICD-10-CM | POA: Diagnosis not present

## 2015-06-15 DIAGNOSIS — Z8739 Personal history of other diseases of the musculoskeletal system and connective tissue: Secondary | ICD-10-CM | POA: Diagnosis not present

## 2015-06-15 DIAGNOSIS — Z8719 Personal history of other diseases of the digestive system: Secondary | ICD-10-CM | POA: Diagnosis not present

## 2015-06-15 DIAGNOSIS — Z7982 Long term (current) use of aspirin: Secondary | ICD-10-CM | POA: Diagnosis not present

## 2015-06-15 DIAGNOSIS — Z09 Encounter for follow-up examination after completed treatment for conditions other than malignant neoplasm: Secondary | ICD-10-CM | POA: Diagnosis not present

## 2015-06-20 ENCOUNTER — Encounter: Payer: Self-pay | Admitting: Internal Medicine

## 2015-06-30 ENCOUNTER — Other Ambulatory Visit: Payer: Self-pay

## 2015-06-30 ENCOUNTER — Encounter: Payer: Self-pay | Admitting: Internal Medicine

## 2015-06-30 ENCOUNTER — Ambulatory Visit (INDEPENDENT_AMBULATORY_CARE_PROVIDER_SITE_OTHER): Payer: Medicare Other | Admitting: Internal Medicine

## 2015-06-30 VITALS — BP 119/71 | HR 64 | Temp 97.8°F | Ht 66.0 in | Wt 168.2 lb

## 2015-06-30 DIAGNOSIS — Z86010 Personal history of colonic polyps: Secondary | ICD-10-CM

## 2015-06-30 MED ORDER — PEG 3350-KCL-NA BICARB-NACL 420 G PO SOLR: 4000.0000 mL | ORAL | Status: DC

## 2015-06-30 NOTE — Progress Notes (Signed)
Primary Care Physician:  [name], MD Primary Gastroenterologist:  Dr. Gala Romney  Pre-Procedure History & Physical: HPI:  Joshua Strong is Joshua 81 y.o. male here for consideration of surveillance colonoscopy.Marland Kitchen History of multiple colonic adenomas removed in 2013 and on prior colonoscopy before that. Aside from constipation, no lower GI tract symptoms. Has had some mild flares his pancreas pain. Has not been admitted overnight. Did see Dr. Jerl Santos at [facility] and an EUS which revealed Joshua small cyst. Nothing to suggest neoplasia. He's being followed there for that issue. No rectal bleeding or melena. Chronic constipation for which takes MiraLax.  Joshua Strong has Joshua history of multiple colonic adenomas. 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  Past Medical History  Diagnosis Date  . GERD (gastroesophageal reflux disease)   . Pancreatitis   . Renal cell carcinoma   . Tubular adenoma   . GI bleed     ulcer  . Hemorrhoid   . Tubular adenoma 2013    Past Surgical History  Procedure Laterality Date  . Spinal fusion    . Cholecystectomy    . Testicular tumor    . Hand surgery    . Pancreatic duct stent    . Knee surgery      right  . Colonoscopy  06/16/2007    Dr. Gala Romney- minimal internal hemorrhoids o/w normal rectum,tubular adenoma  . Shoulder surgery      left  . Colonoscopy  07/02/2012    Dr.Rourk- normal rectum, 3 5-7 mm peduncuated polyos, one in the mid sigmoid segment and 2 in the ascending segment. remainder of the colonic mucosa appeared normal. bx= tubular adenoma    Prior to Admission medications   Medication Sig Start Date End Date Taking? Authorizing Provider  aspirin 81 MG tablet Take 81 mg by mouth daily.   Yes Historical Provider, MD  calcium citrate-vitamin D (CITRACAL+D) 315-200 MG-UNIT per tablet Take 1 tablet by mouth daily.   Yes Historical  Provider, MD  HYDROcodone-acetaminophen (VICODIN) 5-500 MG per tablet Take 1 tablet by mouth every 6 (six) hours as needed.   Yes Historical Provider, MD  Multiple Vitamin (MULTIVITAMIN) capsule Take 1 capsule by mouth daily.   Yes Historical Provider, MD  Polyvinyl Alcohol-Povidone (REFRESH OP) Place 2 drops into both eyes 2 (two) times daily.   Yes Historical Provider, MD  promethazine (PHENERGAN) 25 MG tablet Take 25 mg by mouth every 8 (eight) hours as needed. 06/05/12  Yes Daneil Dolin, MD  peg 3350 powder (MOVIPREP) 100 G SOLR Take 1 kit (100 g total) by mouth as directed. Patient not taking: Reported on 06/30/2015 06/05/12   Daneil Dolin, MD  predniSONE (DELTASONE) 20 MG tablet Take 20 mg by mouth daily.    Historical Provider, MD  Probiotic Product (ALIGN) 4 MG CAPS Take 4 mg by mouth daily.    Historical Provider, MD    Allergies as of 06/30/2015 - Review Complete 06/30/2015  Allergen Reaction Noted  . Morphine and related Other (See Comments) 06/24/2012  . Metoclopramide hcl Rash     Family History  Problem Relation Age of Onset  . Adopted: Yes    Social History   Social History  . Marital Status: Married    Spouse Name: [name]  . Number of Children: [name]  . Years of Education: [name]   Occupational History  . Not on  file.   Social History Main Topics  . Smoking status: Former Smoker -- 0.50 packs/day for 3 years    Types: Cigarettes  . Smokeless tobacco: Not on file     Comment: quit in 1958  . Alcohol Use: No  . Drug Use: No  . Sexual Activity: Not on file   Other Topics Concern  . Not on file   Social History Narrative    Review of Systems: See HPI, otherwise negative ROS  Physical Exam: BP 119/71 mmHg  Pulse 64  Temp(Src) 97.8 F (36.6 C)  Ht _0  (1.676 m)  Wt 168 lb 3.2 oz (76.295 kg)  BMI 27.16 kg/m2 General:   Alert,  Well-developed, well-nourished, pleasant and cooperative in NAD Skin:  Intact without significant lesions or rashes. Eyes:   Sclera clear, no icterus.   Conjunctiva pink. Ears:  Normal auditory acuity. Nose:  No deformity, discharge,  or lesions. Mouth:  No deformity or lesions. Neck:  Supple; no masses or thyromegaly. No significant cervical adenopathy. Lungs:  Clear throughout to auscultation.   No wheezes, crackles, or rhonchi. No acute distress. Heart:  Regular rate and rhythm; no murmurs, clicks, rubs,  or gallops. Abdomen: Non-distended, normal bowel sounds.  Soft and nontender without appreciable mass or hepatosplenomegaly.  Pulses:  Normal pulses noted. Extremities:  Without clubbing or edema.  Impression:  Very pleasant 81 year old gentleman with Joshua history of multiple colonic adenomas removed over serial colonoscopies. He is occasionally constipated but not any other lower GI tract symptoms. We discussed the risk and benefits, pros and cons of one more surveillance colonoscopy. In the case of this patient, I think it is very reasonable to offer 1 more surveillance colonoscopy. Occasional constipation well managed with MiraLax. He is interested in having another surveillance colonoscopy.The risks, benefits, limitations, alternatives and imponderables have been reviewed with the patient. Questions have been answered. All parties are agreeable.     Recommendations:  Schedule Joshua surveillance colonoscopy (history of colon polyps)  Split prep, further recommendations to follow  Continue MiraLax every evening when no bowel movement on any given day      Notice: This dictation was prepared with Dragon dictation along with smaller phrase technology. Any transcriptional errors that result from this process are unintentional and may not be corrected upon review.

## 2015-06-30 NOTE — Patient Instructions (Signed)
Schedule a surveillance colonoscopy (history of colon polyps)  Split prep, further recommendations to follow

## 2015-07-14 ENCOUNTER — Other Ambulatory Visit: Payer: Self-pay

## 2015-07-17 ENCOUNTER — Encounter (HOSPITAL_COMMUNITY)
Admission: RE | Disposition: A | Discharge status: Home / Self Care | Payer: Self-pay | Source: Ambulatory Visit | Attending: Internal Medicine

## 2015-07-17 ENCOUNTER — Encounter (HOSPITAL_COMMUNITY): Payer: Self-pay | Admitting: *Deleted

## 2015-07-17 ENCOUNTER — Ambulatory Visit (HOSPITAL_COMMUNITY)
Admission: RE | Admit: 2015-07-17 | Discharge: 2015-07-17 | Disposition: A | Discharge status: Home / Self Care | Payer: Medicare Other | Source: Ambulatory Visit | Attending: Internal Medicine | Admitting: Internal Medicine

## 2015-07-17 DIAGNOSIS — Q438 Other specified congenital malformations of intestine: Secondary | ICD-10-CM | POA: Insufficient documentation

## 2015-07-17 DIAGNOSIS — K5730 Diverticulosis of large intestine without perforation or abscess without bleeding: Secondary | ICD-10-CM | POA: Diagnosis not present

## 2015-07-17 DIAGNOSIS — Z1211 Encounter for screening for malignant neoplasm of colon: Secondary | ICD-10-CM | POA: Diagnosis not present

## 2015-07-17 DIAGNOSIS — K5909 Other constipation: Secondary | ICD-10-CM | POA: Diagnosis not present

## 2015-07-17 DIAGNOSIS — Z86010 Personal history of colonic polyps: Secondary | ICD-10-CM

## 2015-07-17 DIAGNOSIS — Z87891 Personal history of nicotine dependence: Secondary | ICD-10-CM | POA: Diagnosis not present

## 2015-07-17 DIAGNOSIS — Z7982 Long term (current) use of aspirin: Secondary | ICD-10-CM | POA: Insufficient documentation

## 2015-07-17 DIAGNOSIS — D120 Benign neoplasm of cecum: Secondary | ICD-10-CM | POA: Diagnosis not present

## 2015-07-17 DIAGNOSIS — Z85528 Personal history of other malignant neoplasm of kidney: Secondary | ICD-10-CM | POA: Insufficient documentation

## 2015-07-17 HISTORY — PX: COLONOSCOPY: SHX5424

## 2015-07-17 SURGERY — COLONOSCOPY
Anesthesia: Moderate Sedation

## 2015-07-17 MED ORDER — MEPERIDINE HCL 100 MG/ML IJ SOLN
INTRAMUSCULAR | Status: AC
  Filled 2015-07-17: qty 2

## 2015-07-17 MED ORDER — MIDAZOLAM HCL 5 MG/5ML IJ SOLN
INTRAMUSCULAR | Status: AC
  Filled 2015-07-17: qty 10

## 2015-07-17 MED ORDER — ONDANSETRON HCL 4 MG/2ML IJ SOLN
INTRAMUSCULAR | Status: DC | PRN
  Administered 2015-07-17: 4 mg via INTRAVENOUS

## 2015-07-17 MED ORDER — MIDAZOLAM HCL 5 MG/5ML IJ SOLN
INTRAMUSCULAR | Status: DC | PRN
  Administered 2015-07-17 (×4): 1 mg via INTRAVENOUS
  Administered 2015-07-17: 2 mg via INTRAVENOUS

## 2015-07-17 MED ORDER — SODIUM CHLORIDE 0.9 % IV SOLN
INTRAVENOUS | Status: DC
Start: 2015-07-17 — End: 2015-07-17
  Administered 2015-07-17: 1000 mL via INTRAVENOUS

## 2015-07-17 MED ORDER — ONDANSETRON HCL 4 MG/2ML IJ SOLN
INTRAMUSCULAR | Status: AC
  Filled 2015-07-17: qty 2

## 2015-07-17 MED ORDER — MEPERIDINE HCL 100 MG/ML IJ SOLN
INTRAMUSCULAR | Status: DC | PRN
  Administered 2015-07-17: 50 mg via INTRAVENOUS
  Administered 2015-07-17 (×2): 25 mg via INTRAVENOUS

## 2015-07-17 NOTE — Op Note (Signed)
[facility] [address] [place], [address]   COLONOSCOPY PROCEDURE REPORT  PATIENT: Strong, Joshua  MR#: [id number] BIRTHDATE: March 15, 1931 , 84  yrs. old GENDER: male ENDOSCOPIST: R.  Garfield Cornea, MD FACP [facility] REFERRED OK:[id number] Quillian Quince, M.D. PROCEDURE DATE:  22-Jul-2015 PROCEDURE:   Colonoscopy with biopsy INDICATIONS:Surveillance examination; history of colonic adenoma. MEDICATIONS: Versed 6 mg IV and Demerol 100 mg IV in divided doses. Zofran 4 mg IV. ASA CLASS:       Class II  CONSENT: The risks, benefits, alternatives and imponderables including but not limited to bleeding, perforation as well as the possibility of a missed lesion have been reviewed.  The potential for biopsy, lesion removal, etc. have also been discussed. Questions have been answered.  All parties agreeable.  Please see the history and physical in the medical record for more information.  DESCRIPTION OF PROCEDURE:   After the risks benefits and alternatives of the procedure were thoroughly explained, informed consent was obtained.  The digital rectal exam revealed no abnormalities of the rectum.   The EC-[id number] ([id number])  endoscope was introduced through the anus and advanced to the cecum, which was identified by both the appendix and ileocecal valve. No adverse events experienced.   The quality of the prep was adequate  The instrument was then slowly withdrawn as the colon was fully examined. Estimated blood loss is zero unless otherwise noted in this procedure report.      COLON FINDINGS: Normal-appearing rectal mucosa.  Scattered left-sided diverticula; (1) diminutive polyp in the base of the cecum; otherwise, the remainder of the colonic mucosa appeared normal.  Patient's colon was redundant. External abdominal pressure and changing of the patient's position required to reach the cecum.  The above-mentioned polyp was cold biopsy/removed.  Retroflexion was performed. .  Withdrawal  time=7 minutes 0 seconds.  The scope was withdrawn and the procedure completed. COMPLICATIONS: There were no immediate complications. EBL 1 mL ENDOSCOPIC IMPRESSION: Colonic diverticulosis. Redundant colon. Single colonic polyp?"removed as described above  RECOMMENDATIONS: Follow-up on pathology.  eSigned:  R. Garfield Cornea, MD Rosalita Chessman [facility] 07-22-15 8:15 AM   cc:  CPT CODES: ICD CODES:  The ICD and CPT codes recommended by this software are interpretations from the data that the clinical staff has captured with the software.  The verification of the translation of this report to the ICD and CPT codes and modifiers is the sole responsibility of the health care institution and practicing physician where this report was generated.  [employer] will not be held responsible for the validity of the ICD and CPT codes included on this report.  AMA assumes no liability for data contained or not contained herein. CPT is a Designer, television/film set of the [employer].  PATIENT NAME:  [name], [name] MR#: [id number]

## 2015-07-17 NOTE — H&P (View-Only) (Signed)
  Primary Care Physician:  SASSER,PAUL W, MD Primary Gastroenterologist:  Dr. Rourk  Pre-Procedure History & Physical: HPI:  A Joshua Strong is a 81 y.o. male here for consideration of surveillance colonoscopy.. History of multiple colonic adenomas removed in 2013 and on prior colonoscopy before that. Aside from constipation, no lower GI tract symptoms. Has had some mild flares his pancreas pain. Has not been admitted overnight. Did see Dr. Jerry Evans at [facility] and an EUS which revealed a small cyst. Nothing to suggest neoplasia. He's being followed there for that issue. No rectal bleeding or melena. Chronic constipation for which takes MiraLax.  Michelle has a history of multiple colonic adenomas. Addition, he has a history of multiple cancers including right renal cell carcinoma, testicular cancer and had a melanoma removed from his forehead earlier this year.  Past Medical History  Diagnosis Date  . GERD (gastroesophageal reflux disease)   . Pancreatitis   . Renal cell carcinoma   . Tubular adenoma   . GI bleed     ulcer  . Hemorrhoid   . Tubular adenoma 2013    Past Surgical History  Procedure Laterality Date  . Spinal fusion    . Cholecystectomy    . Testicular tumor    . Hand surgery    . Pancreatic duct stent    . Knee surgery      right  . Colonoscopy  06/16/2007    Dr. Rourk- minimal internal hemorrhoids o/w normal rectum,tubular adenoma  . Shoulder surgery      left  . Colonoscopy  07/02/2012    Dr.Rourk- normal rectum, 3 5-7 mm peduncuated polyos, one in the mid sigmoid segment and 2 in the ascending segment. remainder of the colonic mucosa appeared normal. bx= tubular adenoma    Prior to Admission medications   Medication Sig Start Date End Date Taking? Authorizing Provider  aspirin 81 MG tablet Take 81 mg by mouth daily.   Yes Historical Provider, MD  calcium citrate-vitamin D (CITRACAL+D) 315-200 MG-UNIT per tablet Take 1 tablet by mouth daily.   Yes Historical  Provider, MD  HYDROcodone-acetaminophen (VICODIN) 5-500 MG per tablet Take 1 tablet by mouth every 6 (six) hours as needed.   Yes Historical Provider, MD  Multiple Vitamin (MULTIVITAMIN) capsule Take 1 capsule by mouth daily.   Yes Historical Provider, MD  Polyvinyl Alcohol-Povidone (REFRESH OP) Place 2 drops into both eyes 2 (two) times daily.   Yes Historical Provider, MD  promethazine (PHENERGAN) 25 MG tablet Take 25 mg by mouth every 8 (eight) hours as needed. 06/05/12  Yes Robert M Rourk, MD  peg 3350 powder (MOVIPREP) 100 G SOLR Take 1 kit (100 g total) by mouth as directed. Patient not taking: Reported on 06/30/2015 06/05/12   Robert M Rourk, MD  predniSONE (DELTASONE) 20 MG tablet Take 20 mg by mouth daily.    Historical Provider, MD  Probiotic Product (ALIGN) 4 MG CAPS Take 4 mg by mouth daily.    Historical Provider, MD    Allergies as of 06/30/2015 - Review Complete 06/30/2015  Allergen Reaction Noted  . Morphine and related Other (See Comments) 06/24/2012  . Metoclopramide hcl Rash     Family History  Problem Relation Age of Onset  . Adopted: Yes    Social History   Social History  . Marital Status: Married    Spouse Name: N/A  . Number of Children: N/A  . Years of Education: N/A   Occupational History  . Not on   file.   Social History Main Topics  . Smoking status: Former Smoker -- 0.50 packs/day for 3 years    Types: Cigarettes  . Smokeless tobacco: Not on file     Comment: quit in 1958  . Alcohol Use: No  . Drug Use: No  . Sexual Activity: Not on file   Other Topics Concern  . Not on file   Social History Narrative    Review of Systems: See HPI, otherwise negative ROS  Physical Exam: BP 119/71 mmHg  Pulse 64  Temp(Src) 97.8 F (36.6 Joshua)  Ht 5' 6" (1.676 m)  Wt 168 lb 3.2 oz (76.295 kg)  BMI 27.16 kg/m2 General:   Alert,  Well-developed, well-nourished, pleasant and cooperative in NAD Skin:  Intact without significant lesions or rashes. Eyes:   Sclera clear, no icterus.   Conjunctiva pink. Ears:  Normal auditory acuity. Nose:  No deformity, discharge,  or lesions. Mouth:  No deformity or lesions. Neck:  Supple; no masses or thyromegaly. No significant cervical adenopathy. Lungs:  Clear throughout to auscultation.   No wheezes, crackles, or rhonchi. No acute distress. Heart:  Regular rate and rhythm; no murmurs, clicks, rubs,  or gallops. Abdomen: Non-distended, normal bowel sounds.  Soft and nontender without appreciable mass or hepatosplenomegaly.  Pulses:  Normal pulses noted. Extremities:  Without clubbing or edema.  Impression:  Very pleasant 81-year-old gentleman with a history of multiple colonic adenomas removed over serial colonoscopies. He is occasionally constipated but not any other lower GI tract symptoms. We discussed the risk and benefits, pros and cons of one more surveillance colonoscopy. In the case of this patient, I think it is very reasonable to offer 1 more surveillance colonoscopy. Occasional constipation well managed with MiraLax. He is interested in having another surveillance colonoscopy.The risks, benefits, limitations, alternatives and imponderables have been reviewed with the patient. Questions have been answered. All parties are agreeable.     Recommendations:  Schedule a surveillance colonoscopy (history of colon polyps)  Split prep, further recommendations to follow  Continue MiraLax every evening when no bowel movement on any given day      Notice: This dictation was prepared with Dragon dictation along with smaller phrase technology. Any transcriptional errors that result from this process are unintentional and may not be corrected upon review. 

## 2015-07-17 NOTE — Interval H&P Note (Signed)
History and Physical Interval Note:  07/17/2015 7:34 AM  A C Minch  has presented today for surgery, with the diagnosis of history of polyps  The various methods of treatment have been discussed with the patient and family. After consideration of risks, benefits and other options for treatment, the patient has consented to  Procedure(s) with comments: COLONOSCOPY (N/A) - 730  as a surgical intervention .  The patient's history has been reviewed, patient examined, no change in status, stable for surgery.  I have reviewed the patient's chart and labs.  Questions were answered to the patient's satisfaction.     No change. Surveillance colonoscopy per plan.The risks, benefits, limitations, alternatives and imponderables have been reviewed with the patient. Questions have been answered. All parties are agreeable.     Manus Rudd

## 2015-07-17 NOTE — Discharge Instructions (Signed)
Colonoscopy Discharge Instructions  Read the instructions outlined below and refer to this sheet in the next few weeks. These discharge instructions provide you with general information on caring for yourself after you leave the hospital. Your doctor may also give you specific instructions. While your treatment has been planned according to the most current medical practices available, unavoidable complications occasionally occur. If you have any problems or questions after discharge, call Dr. Gala Romney at [phone]. ACTIVITY  You may resume your regular activity, but move at a slower pace for the next 24 hours.   Take frequent rest periods for the next 24 hours.   Walking will help get rid of the air and reduce the bloated feeling in your belly (abdomen).   No driving for 24 hours (because of the medicine (anesthesia) used during the test).    Do not sign any important legal documents or operate any machinery for 24 hours (because of the anesthesia used during the test).  NUTRITION  Drink plenty of fluids.   You may resume your normal diet as instructed by your doctor.   Begin with a light meal and progress to your normal diet. Heavy or fried foods are harder to digest and may make you feel sick to your stomach (nauseated).   Avoid alcoholic beverages for 24 hours or as instructed.  MEDICATIONS  You may resume your normal medications unless your doctor tells you otherwise.  WHAT YOU CAN EXPECT TODAY  Some feelings of bloating in the abdomen.   Passage of more gas than usual.   Spotting of blood in your stool or on the toilet paper.  IF YOU HAD POLYPS REMOVED DURING THE COLONOSCOPY:  No aspirin products for 7 days or as instructed.   No alcohol for 7 days or as instructed.   Eat a soft diet for the next 24 hours.  FINDING OUT THE RESULTS OF YOUR TEST Not all test results are available during your visit. If your test results are not back during the visit, make an appointment  with your caregiver to find out the results. Do not assume everything is normal if you have not heard from your caregiver or the medical facility. It is important for you to follow up on all of your test results.  SEEK IMMEDIATE MEDICAL ATTENTION IF:  You have more than a spotting of blood in your stool.   Your belly is swollen (abdominal distention).   You are nauseated or vomiting.   You have a temperature over 101.   You have abdominal pain or discomfort that is severe or gets worse throughout the day.    Polyp and diverticulosis information provided Further recommendations to follow pending review of pathology report  Diverticulosis Diverticulosis is the condition that develops when small pouches (diverticula) form in the wall of your colon. Your colon, or large intestine, is where water is absorbed and stool is formed. The pouches form when the inside layer of your colon pushes through weak spots in the outer layers of your colon. CAUSES  No one knows exactly what causes diverticulosis. RISK FACTORS  Being older than 74. Your risk for this condition increases with age. Diverticulosis is rare in people younger than 40 years. By age 47, almost everyone has it.  Eating a low-fiber diet.  Being frequently constipated.  Being overweight.  Not getting enough exercise.  Smoking.  Taking over-the-counter pain medicines, like aspirin and ibuprofen. SYMPTOMS  Most people with diverticulosis do not have symptoms. DIAGNOSIS  Because diverticulosis  Because diverticulosis often has no symptoms, health care providers often discover the condition during an exam for other colon problems. In many cases, a health care provider will diagnose diverticulosis while using a flexible scope to examine the colon (colonoscopy). °TREATMENT  °If you have never developed an infection related to diverticulosis, you may not need treatment. If you have had an infection before, treatment may include: °· Eating more  fruits, vegetables, and grains. °· Taking a fiber supplement. °· Taking a live bacteria supplement (probiotic). °· Taking medicine to relax your colon. °HOME CARE INSTRUCTIONS  °· Drink at least 6-8 glasses of water each day to prevent constipation. °· Try not to strain when you have a bowel movement. °· Keep all follow-up appointments. °If you have had an infection before:  °· Increase the fiber in your diet as directed by your health care provider or dietitian. °· Take a dietary fiber supplement if your health care provider approves. °· Only take medicines as directed by your health care provider. °SEEK MEDICAL CARE IF:  °· You have abdominal pain. °· You have bloating. °· You have cramps. °· You have not gone to the bathroom in 3 days. °SEEK IMMEDIATE MEDICAL CARE IF:  °· Your pain gets worse. °· Your bloating becomes very bad. °· You have a fever or chills, and your symptoms suddenly get worse. °· You begin vomiting. °· You have bowel movements that are bloody or black. °MAKE SURE YOU: °· Understand these instructions. °· Will watch your condition. °· Will get help right away if you are not doing well or get worse. °  °This information is not intended to replace advice given to you by your health care provider. Make sure you discuss any questions you have with your health care provider. °  °Document Released: 06/13/2004 Document Revised: 09/21/2013 Document Reviewed: 08/11/2013 °Elsevier Interactive Patient Education ©2016 Elsevier Inc[employer] °Colon Polyps °Polyps are lumps of extra tissue growing inside the body. Polyps can grow in the large intestine (colon). Most colon polyps are noncancerous (benign). However, some colon polyps can become cancerous over time. Polyps that are larger than a pea may be harmful. To be safe, caregivers remove and test all polyps. °CAUSES  °Polyps form when mutations in the genes cause your cells to grow and divide even though no more tissue is needed. °RISK FACTORS °There are a number  of risk factors that can increase your chances of getting colon polyps. They include: °· Being older than 50 years. °· Family history of colon polyps or colon cancer. °· Long-term colon diseases, such as colitis or Crohn disease. °· Being overweight. °· Smoking. °· Being inactive. °· Drinking too much alcohol. °SYMPTOMS  °Most small polyps do not cause symptoms. If symptoms are present, they may include: °· Blood in the stool. The stool may look dark red or black. °· Constipation or diarrhea that lasts longer than 1 week. °DIAGNOSIS °People often do not know they have polyps until their caregiver finds them during a regular checkup. Your caregiver can use 4 tests to check for polyps: °· Digital rectal exam. The caregiver wears gloves and feels inside the rectum. This test would find polyps only in the rectum. °· Barium enema. The caregiver puts a liquid called barium into your rectum before taking X-rays of your colon. Barium makes your colon look white. Polyps are dark, so they are easy to see in the X-ray pictures. °· Sigmoidoscopy. A thin, flexible tube (sigmoidoscope) is placed into your rectum. The sigmoidoscope has a   tiny camera in it. The caregiver uses the sigmoidoscope to look at the last third of your colon.  Colonoscopy. This test is like sigmoidoscopy, but the caregiver looks at the entire colon. This is the most common method for finding and removing polyps. TREATMENT  Any polyps will be removed during a sigmoidoscopy or colonoscopy. The polyps are then tested for cancer. PREVENTION  To help lower your risk of getting more colon polyps:  Eat plenty of fruits and vegetables. Avoid eating fatty foods.  Do not smoke.  Avoid drinking alcohol.  Exercise every day.  Lose weight if recommended by your caregiver.  Eat plenty of calcium and folate. Foods that are rich in calcium include milk, cheese, and broccoli. Foods that are rich in folate include chickpeas, kidney beans, and  spinach. HOME CARE INSTRUCTIONS Keep all follow-up appointments as directed by your caregiver. You may need periodic exams to check for polyps. SEEK MEDICAL CARE IF: You notice bleeding during a bowel movement.   This information is not intended to replace advice given to you by your health care provider. Make sure you discuss any questions you have with your health care provider.   Document Released: 06/12/2004 Document Revised: 10/07/2014 Document Reviewed: 11/26/2011 Elsevier Interactive Patient Education 2016 Elsevier Inc. Colonoscopy, Care After Refer to this sheet in the next few weeks. These instructions provide you with information on caring for yourself after your procedure. Your health care provider may also give you more specific instructions. Your treatment has been planned according to current medical practices, but problems sometimes occur. Call your health care provider if you have any problems or questions after your procedure. WHAT TO EXPECT AFTER THE PROCEDURE  After your procedure, it is typical to have the following:  A small amount of blood in your stool.  Moderate amounts of gas and mild abdominal cramping or bloating. HOME CARE INSTRUCTIONS  Do not drive, operate machinery, or sign important documents for 24 hours.  You may shower and resume your regular physical activities, but move at a slower pace for the first 24 hours.  Take frequent rest periods for the first 24 hours.  Walk around or put a warm pack on your abdomen to help reduce abdominal cramping and bloating.  Drink enough fluids to keep your urine clear or pale yellow.  You may resume your normal diet as instructed by your health care provider. Avoid heavy or fried foods that are hard to digest.  Avoid drinking alcohol for 24 hours or as instructed by your health care provider.  Only take over-the-counter or prescription medicines as directed by your health care provider.  If a tissue sample  (biopsy) was taken during your procedure:  Do not take aspirin or blood thinners for 7 days, or as instructed by your health care provider.  Do not drink alcohol for 7 days, or as instructed by your health care provider.  Eat soft foods for the first 24 hours. SEEK MEDICAL CARE IF: You have persistent spotting of blood in your stool 2-3 days after the procedure. SEEK IMMEDIATE MEDICAL CARE IF:  You have more than a small spotting of blood in your stool.  You pass large blood clots in your stool.  Your abdomen is swollen (distended).  You have nausea or vomiting.  You have a fever.  You have increasing abdominal pain that is not relieved with medicine.   This information is not intended to replace advice given to you by your health care provider. Make sure  you discuss any questions you have with your health care provider.   Document Released: 04/30/2004 Document Revised: 07/07/2013 Document Reviewed: 05/24/2013 Elsevier Interactive Patient Education [employer].

## 2015-07-19 ENCOUNTER — Encounter: Payer: Self-pay | Admitting: Internal Medicine

## 2015-07-20 ENCOUNTER — Encounter (HOSPITAL_COMMUNITY): Payer: Self-pay | Admitting: Internal Medicine

## 2015-08-01 DIAGNOSIS — Z85820 Personal history of malignant melanoma of skin: Secondary | ICD-10-CM | POA: Diagnosis not present

## 2015-08-01 DIAGNOSIS — L570 Actinic keratosis: Secondary | ICD-10-CM | POA: Diagnosis not present

## 2015-08-03 DIAGNOSIS — R2681 Unsteadiness on feet: Secondary | ICD-10-CM | POA: Diagnosis not present

## 2015-08-03 DIAGNOSIS — R252 Cramp and spasm: Secondary | ICD-10-CM | POA: Diagnosis not present

## 2015-08-03 DIAGNOSIS — R5383 Other fatigue: Secondary | ICD-10-CM | POA: Diagnosis not present

## 2015-08-08 DIAGNOSIS — M5416 Radiculopathy, lumbar region: Secondary | ICD-10-CM | POA: Diagnosis not present

## 2015-08-08 DIAGNOSIS — M4806 Spinal stenosis, lumbar region: Secondary | ICD-10-CM | POA: Diagnosis not present

## 2015-08-08 DIAGNOSIS — M961 Postlaminectomy syndrome, not elsewhere classified: Secondary | ICD-10-CM | POA: Diagnosis not present

## 2015-08-09 DIAGNOSIS — M25511 Pain in right shoulder: Secondary | ICD-10-CM | POA: Diagnosis not present

## 2015-08-09 DIAGNOSIS — M7551 Bursitis of right shoulder: Secondary | ICD-10-CM | POA: Diagnosis not present

## 2015-08-09 DIAGNOSIS — M1711 Unilateral primary osteoarthritis, right knee: Secondary | ICD-10-CM | POA: Diagnosis not present

## 2015-08-15 DIAGNOSIS — M75111 Incomplete rotator cuff tear or rupture of right shoulder, not specified as traumatic: Secondary | ICD-10-CM | POA: Diagnosis not present

## 2015-08-15 DIAGNOSIS — M25511 Pain in right shoulder: Secondary | ICD-10-CM | POA: Diagnosis not present

## 2015-08-15 DIAGNOSIS — M7581 Other shoulder lesions, right shoulder: Secondary | ICD-10-CM | POA: Diagnosis not present

## 2015-08-16 DIAGNOSIS — M25511 Pain in right shoulder: Secondary | ICD-10-CM | POA: Diagnosis not present

## 2015-08-16 DIAGNOSIS — M1711 Unilateral primary osteoarthritis, right knee: Secondary | ICD-10-CM | POA: Diagnosis not present

## 2015-09-14 DIAGNOSIS — R269 Unspecified abnormalities of gait and mobility: Secondary | ICD-10-CM | POA: Diagnosis not present

## 2015-09-14 DIAGNOSIS — R296 Repeated falls: Secondary | ICD-10-CM | POA: Diagnosis not present

## 2015-09-14 DIAGNOSIS — Z885 Allergy status to narcotic agent status: Secondary | ICD-10-CM | POA: Diagnosis not present

## 2015-09-14 DIAGNOSIS — Z888 Allergy status to other drugs, medicaments and biological substances status: Secondary | ICD-10-CM | POA: Diagnosis not present

## 2015-09-14 DIAGNOSIS — Z87891 Personal history of nicotine dependence: Secondary | ICD-10-CM | POA: Diagnosis not present

## 2015-09-14 DIAGNOSIS — W19XXXD Unspecified fall, subsequent encounter: Secondary | ICD-10-CM | POA: Diagnosis not present

## 2015-09-14 DIAGNOSIS — R260 Ataxic gait: Secondary | ICD-10-CM | POA: Diagnosis not present

## 2015-10-19 DIAGNOSIS — K210 Gastro-esophageal reflux disease with esophagitis: Secondary | ICD-10-CM | POA: Diagnosis not present

## 2015-10-19 DIAGNOSIS — K859 Acute pancreatitis without necrosis or infection, unspecified: Secondary | ICD-10-CM | POA: Diagnosis not present

## 2015-11-17 DIAGNOSIS — K861 Other chronic pancreatitis: Secondary | ICD-10-CM | POA: Diagnosis not present

## 2015-11-17 DIAGNOSIS — N289 Disorder of kidney and ureter, unspecified: Secondary | ICD-10-CM | POA: Diagnosis not present

## 2015-12-14 DIAGNOSIS — Z885 Allergy status to narcotic agent status: Secondary | ICD-10-CM | POA: Diagnosis not present

## 2015-12-14 DIAGNOSIS — M150 Primary generalized (osteo)arthritis: Secondary | ICD-10-CM | POA: Diagnosis not present

## 2015-12-14 DIAGNOSIS — M25542 Pain in joints of left hand: Secondary | ICD-10-CM | POA: Diagnosis not present

## 2015-12-14 DIAGNOSIS — Z79899 Other long term (current) drug therapy: Secondary | ICD-10-CM | POA: Diagnosis not present

## 2015-12-14 DIAGNOSIS — M810 Age-related osteoporosis without current pathological fracture: Secondary | ICD-10-CM | POA: Diagnosis not present

## 2015-12-14 DIAGNOSIS — Z87891 Personal history of nicotine dependence: Secondary | ICD-10-CM | POA: Diagnosis not present

## 2015-12-14 DIAGNOSIS — Z888 Allergy status to other drugs, medicaments and biological substances status: Secondary | ICD-10-CM | POA: Diagnosis not present

## 2015-12-14 DIAGNOSIS — M353 Polymyalgia rheumatica: Secondary | ICD-10-CM | POA: Diagnosis not present

## 2016-01-11 DIAGNOSIS — K861 Other chronic pancreatitis: Secondary | ICD-10-CM | POA: Diagnosis not present

## 2016-01-11 DIAGNOSIS — N289 Disorder of kidney and ureter, unspecified: Secondary | ICD-10-CM | POA: Diagnosis not present

## 2016-01-11 DIAGNOSIS — Z85528 Personal history of other malignant neoplasm of kidney: Secondary | ICD-10-CM | POA: Diagnosis not present

## 2016-01-11 DIAGNOSIS — K219 Gastro-esophageal reflux disease without esophagitis: Secondary | ICD-10-CM | POA: Diagnosis not present

## 2016-01-11 DIAGNOSIS — M810 Age-related osteoporosis without current pathological fracture: Secondary | ICD-10-CM | POA: Diagnosis not present

## 2016-01-11 DIAGNOSIS — M353 Polymyalgia rheumatica: Secondary | ICD-10-CM | POA: Diagnosis not present

## 2016-01-18 DIAGNOSIS — M810 Age-related osteoporosis without current pathological fracture: Secondary | ICD-10-CM | POA: Diagnosis not present

## 2016-01-23 DIAGNOSIS — M4806 Spinal stenosis, lumbar region: Secondary | ICD-10-CM | POA: Diagnosis not present

## 2016-01-30 DIAGNOSIS — R062 Wheezing: Secondary | ICD-10-CM | POA: Diagnosis not present

## 2016-01-30 DIAGNOSIS — Z85820 Personal history of malignant melanoma of skin: Secondary | ICD-10-CM | POA: Diagnosis not present

## 2016-01-30 DIAGNOSIS — J209 Acute bronchitis, unspecified: Secondary | ICD-10-CM | POA: Diagnosis not present

## 2016-01-30 DIAGNOSIS — L570 Actinic keratosis: Secondary | ICD-10-CM | POA: Diagnosis not present

## 2016-02-06 DIAGNOSIS — M810 Age-related osteoporosis without current pathological fracture: Secondary | ICD-10-CM | POA: Diagnosis not present

## 2016-02-06 DIAGNOSIS — N289 Disorder of kidney and ureter, unspecified: Secondary | ICD-10-CM | POA: Diagnosis not present

## 2016-02-06 DIAGNOSIS — Z85528 Personal history of other malignant neoplasm of kidney: Secondary | ICD-10-CM | POA: Diagnosis not present

## 2016-02-09 DIAGNOSIS — M810 Age-related osteoporosis without current pathological fracture: Secondary | ICD-10-CM | POA: Diagnosis not present

## 2016-03-11 DIAGNOSIS — K861 Other chronic pancreatitis: Secondary | ICD-10-CM | POA: Diagnosis not present

## 2016-03-11 DIAGNOSIS — N289 Disorder of kidney and ureter, unspecified: Secondary | ICD-10-CM | POA: Diagnosis not present

## 2016-03-11 DIAGNOSIS — M353 Polymyalgia rheumatica: Secondary | ICD-10-CM | POA: Diagnosis not present

## 2016-03-11 DIAGNOSIS — M810 Age-related osteoporosis without current pathological fracture: Secondary | ICD-10-CM | POA: Diagnosis not present

## 2016-03-25 DIAGNOSIS — M353 Polymyalgia rheumatica: Secondary | ICD-10-CM | POA: Diagnosis not present

## 2016-04-01 DIAGNOSIS — R4189 Other symptoms and signs involving cognitive functions and awareness: Secondary | ICD-10-CM | POA: Diagnosis not present

## 2016-04-01 DIAGNOSIS — R5383 Other fatigue: Secondary | ICD-10-CM | POA: Diagnosis not present

## 2016-04-01 DIAGNOSIS — M791 Myalgia: Secondary | ICD-10-CM | POA: Diagnosis not present

## 2016-04-16 DIAGNOSIS — Z79899 Other long term (current) drug therapy: Secondary | ICD-10-CM | POA: Diagnosis not present

## 2016-04-16 DIAGNOSIS — N289 Disorder of kidney and ureter, unspecified: Secondary | ICD-10-CM | POA: Diagnosis not present

## 2016-04-16 DIAGNOSIS — Z5181 Encounter for therapeutic drug level monitoring: Secondary | ICD-10-CM | POA: Diagnosis not present

## 2016-04-16 DIAGNOSIS — M810 Age-related osteoporosis without current pathological fracture: Secondary | ICD-10-CM | POA: Diagnosis not present

## 2016-04-16 DIAGNOSIS — Z85528 Personal history of other malignant neoplasm of kidney: Secondary | ICD-10-CM | POA: Diagnosis not present

## 2016-04-16 DIAGNOSIS — M353 Polymyalgia rheumatica: Secondary | ICD-10-CM | POA: Diagnosis not present

## 2016-04-16 DIAGNOSIS — K219 Gastro-esophageal reflux disease without esophagitis: Secondary | ICD-10-CM | POA: Diagnosis not present

## 2016-04-16 DIAGNOSIS — K861 Other chronic pancreatitis: Secondary | ICD-10-CM | POA: Diagnosis not present

## 2016-04-25 DIAGNOSIS — M1711 Unilateral primary osteoarthritis, right knee: Secondary | ICD-10-CM | POA: Diagnosis not present

## 2016-04-25 DIAGNOSIS — M25551 Pain in right hip: Secondary | ICD-10-CM | POA: Diagnosis not present

## 2016-04-25 DIAGNOSIS — M25561 Pain in right knee: Secondary | ICD-10-CM | POA: Diagnosis not present

## 2016-04-25 DIAGNOSIS — M25552 Pain in left hip: Secondary | ICD-10-CM | POA: Diagnosis not present

## 2016-05-14 ENCOUNTER — Other Ambulatory Visit: Payer: Self-pay | Admitting: Physician Assistant

## 2016-05-16 DIAGNOSIS — M4806 Spinal stenosis, lumbar region: Secondary | ICD-10-CM | POA: Diagnosis not present

## 2016-05-17 ENCOUNTER — Encounter (HOSPITAL_COMMUNITY): Payer: Self-pay

## 2016-05-17 ENCOUNTER — Encounter (HOSPITAL_COMMUNITY)
Admission: RE | Admit: 2016-05-17 | Discharge: 2016-05-17 | Disposition: A | Discharge status: Home / Self Care | Payer: Medicare Other | Source: Ambulatory Visit | Attending: Orthopaedic Surgery | Admitting: Orthopaedic Surgery

## 2016-05-17 DIAGNOSIS — K219 Gastro-esophageal reflux disease without esophagitis: Secondary | ICD-10-CM | POA: Insufficient documentation

## 2016-05-17 DIAGNOSIS — Z01812 Encounter for preprocedural laboratory examination: Secondary | ICD-10-CM | POA: Diagnosis not present

## 2016-05-17 DIAGNOSIS — K5731 Diverticulosis of large intestine without perforation or abscess with bleeding: Secondary | ICD-10-CM | POA: Insufficient documentation

## 2016-05-17 DIAGNOSIS — Z8551 Personal history of malignant neoplasm of bladder: Secondary | ICD-10-CM | POA: Insufficient documentation

## 2016-05-17 DIAGNOSIS — Z9889 Other specified postprocedural states: Secondary | ICD-10-CM | POA: Diagnosis not present

## 2016-05-17 HISTORY — DX: Pneumonia, unspecified organism: J18.9

## 2016-05-17 LAB — BASIC METABOLIC PANEL
ANION GAP: 7 (ref 5–15)
BUN: 22 mg/dL — AB (ref 6–20)
CHLORIDE: 107 mmol/L (ref 101–111)
CO2: 26 mmol/L (ref 22–32)
Calcium: 9.9 mg/dL (ref 8.9–10.3)
Creatinine, Ser: 1.5 mg/dL — ABNORMAL HIGH (ref 0.61–1.24)
GFR calc Af Amer: 48 mL/min — ABNORMAL LOW (ref >60)
GFR calc non Af Amer: 41 mL/min — ABNORMAL LOW (ref >60)
Glucose, Bld: 84 mg/dL (ref 65–99)
POTASSIUM: 4.8 mmol/L (ref 3.5–5.1)
SODIUM: 140 mmol/L (ref 135–145)

## 2016-05-17 LAB — CBC
HCT: 43.7 % (ref 39.0–52.0)
HEMOGLOBIN: 14.8 g/dL (ref 13.0–17.0)
MCH: 30.6 pg (ref 26.0–34.0)
MCHC: 33.9 g/dL (ref 30.0–36.0)
MCV: 90.3 fL (ref 78.0–100.0)
Platelets: 232 10*3/uL (ref 150–400)
RBC: 4.84 MIL/uL (ref 4.22–5.81)
RDW: 12.9 % (ref 11.5–15.5)
WBC: 14.8 10*3/uL — AB (ref 4.0–10.5)

## 2016-05-17 LAB — SURGICAL PCR SCREEN
MRSA, PCR: NEGATIVE
Staphylococcus aureus: NEGATIVE

## 2016-05-17 NOTE — Patient Instructions (Addendum)
Joshua Strong  05/17/2016   Your procedure is scheduled on: 05/24/16  Report to [facility]  Entrance take [facility]  elevators to 3rd floor to  [place] at 10:45AM.  Call this number if you have problems the morning of surgery [phone]   Remember: ONLY 1 PERSON MAY GO WITH YOU TO SHORT STAY TO GET  READY MORNING OF [place].  Do not eat food or drink liquids :After Midnight.     Take these medicines the morning of surgery with A SIP OF WATER: none                               You may not have any metal on your body including hair pins and              piercings  Do not wear jewelry, make-up, lotions, powders or perfumes, deodorant             Do not wear nail polish.  Do not shave  48 hours prior to surgery.              Men may shave face and neck.   Do not bring valuables to the hospital. [place].  Contacts, dentures or bridgework may not be worn into surgery.  Leave suitcase in the car. After surgery it may be brought to your room.               Please read over the following fact sheets you were given: _____________________________________________________________________             [facility] - Preparing for Surgery Before surgery, you can play an important role.  Because skin is not sterile, your skin needs to be as free of germs as possible.  You can reduce the number of germs on your skin by washing with CHG (chlorahexidine gluconate) soap before surgery.  CHG is an antiseptic cleaner which kills germs and bonds with the skin to continue killing germs even after washing. Please DO NOT use if you have an allergy to CHG or antibacterial soaps.  If your skin becomes reddened/irritated stop using the CHG and inform your nurse when you arrive at Short Stay. Do not shave (including legs and underarms) for at least 48 hours prior to the first CHG shower.  You may shave your  face/neck. Please follow these instructions carefully:  1.  Shower with CHG Soap the night before surgery and the  morning of Surgery.  2.  If you choose to wash your hair, wash your hair first as usual with your  normal  shampoo.  3.  After you shampoo, rinse your hair and body thoroughly to remove the  shampoo.                           4.  Use CHG as you would any other liquid soap.  You can apply chg directly  to the skin and wash                       Gently with a scrungie or clean washcloth.  5.  Apply the CHG Soap to your body ONLY FROM  THE NECK DOWN.   Do not use on face/ open                           Wound or open sores. Avoid contact with eyes, ears mouth and genitals (private parts).                       Wash face,  Genitals (private parts) with your normal soap.             6.  Wash thoroughly, paying special attention to the area where your surgery  will be performed.  7.  Thoroughly rinse your body with warm water from the neck down.  8.  DO NOT shower/wash with your normal soap after using and rinsing off  the CHG Soap.                9.  Pat yourself dry with a clean towel.            10.  Wear clean pajamas.            11.  Place clean sheets on your bed the night of your first shower and do not  sleep with pets. Day of Surgery : Do not apply any lotions/deodorants the morning of surgery.  Please wear clean clothes to the hospital/surgery center.  FAILURE TO FOLLOW THESE INSTRUCTIONS MAY RESULT IN THE CANCELLATION OF YOUR SURGERY PATIENT SIGNATURE_________________________________  NURSE SIGNATURE__________________________________  ________________________________________________________________________   Joshua Strong  An incentive spirometer is a tool that can help keep your lungs clear and active. This tool measures how well you are filling your lungs with each breath. Taking long deep breaths may help reverse or decrease the chance of developing breathing  (pulmonary) problems (especially infection) following:  A long period of time when you are unable to move or be active. BEFORE THE PROCEDURE   If the spirometer includes an indicator to show your best effort, your nurse or respiratory therapist will set it to a desired goal.  If possible, sit up straight or lean slightly forward. Try not to slouch.  Hold the incentive spirometer in an upright position. INSTRUCTIONS FOR USE  1. Sit on the edge of your bed if possible, or sit up as far as you can in bed or on a chair. 2. Hold the incentive spirometer in an upright position. 3. Breathe out normally. 4. Place the mouthpiece in your mouth and seal your lips tightly around it. 5. Breathe in slowly and as deeply as possible, raising the piston or the ball toward the top of the column. 6. Hold your breath for 3-5 seconds or for as long as possible. Allow the piston or ball to fall to the bottom of the column. 7. Remove the mouthpiece from your mouth and breathe out normally. 8. Rest for a few seconds and repeat Steps 1 through 7 at least 10 times every 1-2 hours when you are awake. Take your time and take a few normal breaths between deep breaths. 9. The spirometer may include an indicator to show your best effort. Use the indicator as a goal to work toward during each repetition. 10. After each set of 10 deep breaths, practice coughing to be sure your lungs are clear. If you have an incision (the cut made at the time of surgery), support your incision when coughing by placing a pillow or rolled up towels firmly against it. Once you are  able to get out of bed, walk around indoors and cough well. You may stop using the incentive spirometer when instructed by your caregiver.  RISKS AND COMPLICATIONS  Take your time so you do not get dizzy or light-headed.  If you are in pain, you may need to take or ask for pain medication before doing incentive spirometry. It is harder to take a deep breath if you  are having pain. AFTER USE  Rest and breathe slowly and easily.  It can be helpful to keep track of a log of your progress. Your caregiver can provide you with a simple table to help with this. If you are using the spirometer at home, follow these instructions: Joshua Strong IF:   You are having difficultly using the spirometer.  You have trouble using the spirometer as often as instructed.  Your pain medication is not giving enough relief while using the spirometer.  You develop fever of 100.5 F (38.1 C) or higher. SEEK IMMEDIATE MEDICAL CARE IF:   You cough up bloody sputum that had not been present before.  You develop fever of 102 F (38.9 C) or greater.  You develop worsening pain at or near the incision site. MAKE SURE YOU:   Understand these instructions.  Will watch your condition.  Will get help right away if you are not doing well or get worse. Document Released: 01/27/2007 Document Revised: 12/09/2011 Document Reviewed: 03/30/2007 [facility] Patient Information 2014 [place], [place].   ________________________________________________________________________

## 2016-05-21 NOTE — Progress Notes (Signed)
Pt aware of surgical time change; aware to arrive at [facility] short stay 3rd floor at 10:30 am on 05/24/2016; no food or drink after midnight.

## 2016-05-24 ENCOUNTER — Encounter (HOSPITAL_COMMUNITY)
Admission: RE | Disposition: A | Discharge status: Skilled Nursing Facility | Payer: Self-pay | Source: Ambulatory Visit | Attending: Orthopaedic Surgery

## 2016-05-24 ENCOUNTER — Inpatient Hospital Stay (HOSPITAL_COMMUNITY): Payer: Medicare Other | Admitting: Certified Registered Nurse Anesthetist

## 2016-05-24 ENCOUNTER — Inpatient Hospital Stay (HOSPITAL_COMMUNITY)
Admission: RE | Admit: 2016-05-24 | Discharge: 2016-05-27 | DRG: 470 | Disposition: A | Discharge status: Skilled Nursing Facility | Payer: Medicare Other | Source: Ambulatory Visit | Attending: Orthopaedic Surgery | Admitting: Orthopaedic Surgery

## 2016-05-24 ENCOUNTER — Encounter (HOSPITAL_COMMUNITY): Payer: Self-pay | Admitting: *Deleted

## 2016-05-24 ENCOUNTER — Inpatient Hospital Stay (HOSPITAL_COMMUNITY): Payer: Medicare Other

## 2016-05-24 DIAGNOSIS — M1711 Unilateral primary osteoarthritis, right knee: Principal | ICD-10-CM

## 2016-05-24 DIAGNOSIS — Z96651 Presence of right artificial knee joint: Secondary | ICD-10-CM | POA: Diagnosis not present

## 2016-05-24 DIAGNOSIS — D62 Acute posthemorrhagic anemia: Secondary | ICD-10-CM | POA: Diagnosis not present

## 2016-05-24 DIAGNOSIS — M25561 Pain in right knee: Secondary | ICD-10-CM | POA: Diagnosis not present

## 2016-05-24 DIAGNOSIS — K5900 Constipation, unspecified: Secondary | ICD-10-CM | POA: Diagnosis not present

## 2016-05-24 DIAGNOSIS — Z471 Aftercare following joint replacement surgery: Secondary | ICD-10-CM | POA: Diagnosis not present

## 2016-05-24 DIAGNOSIS — Z85528 Personal history of other malignant neoplasm of kidney: Secondary | ICD-10-CM

## 2016-05-24 DIAGNOSIS — Z87891 Personal history of nicotine dependence: Secondary | ICD-10-CM | POA: Diagnosis not present

## 2016-05-24 DIAGNOSIS — M25569 Pain in unspecified knee: Secondary | ICD-10-CM | POA: Diagnosis not present

## 2016-05-24 DIAGNOSIS — Z981 Arthrodesis status: Secondary | ICD-10-CM

## 2016-05-24 DIAGNOSIS — G8918 Other acute postprocedural pain: Secondary | ICD-10-CM | POA: Diagnosis not present

## 2016-05-24 DIAGNOSIS — K219 Gastro-esophageal reflux disease without esophagitis: Secondary | ICD-10-CM | POA: Diagnosis present

## 2016-05-24 DIAGNOSIS — R2689 Other abnormalities of gait and mobility: Secondary | ICD-10-CM | POA: Diagnosis not present

## 2016-05-24 DIAGNOSIS — M6281 Muscle weakness (generalized): Secondary | ICD-10-CM | POA: Diagnosis not present

## 2016-05-24 DIAGNOSIS — M62838 Other muscle spasm: Secondary | ICD-10-CM | POA: Diagnosis not present

## 2016-05-24 DIAGNOSIS — M179 Osteoarthritis of knee, unspecified: Secondary | ICD-10-CM | POA: Diagnosis not present

## 2016-05-24 DIAGNOSIS — R531 Weakness: Secondary | ICD-10-CM | POA: Diagnosis not present

## 2016-05-24 HISTORY — PX: TOTAL KNEE ARTHROPLASTY: SHX125

## 2016-05-24 SURGERY — ARTHROPLASTY, KNEE, TOTAL
Anesthesia: General | Site: Knee | Laterality: Right

## 2016-05-24 MED ORDER — PHENOL 1.4 % MT LIQD: 1.0000 | OROMUCOSAL | Status: DC | PRN

## 2016-05-24 MED ORDER — FENTANYL CITRATE (PF) 100 MCG/2ML IJ SOLN
100.0000 ug | Freq: Once | INTRAMUSCULAR | Status: AC
  Administered 2016-05-24: 100 ug via INTRAVENOUS

## 2016-05-24 MED ORDER — CALCIUM CITRATE-VITAMIN D 315-200 MG-UNIT PO TABS: 1.0000 | ORAL_TABLET | Freq: Every day | ORAL | Status: DC

## 2016-05-24 MED ORDER — 0.9 % SODIUM CHLORIDE (POUR BTL) OPTIME
TOPICAL | Status: DC | PRN
  Administered 2016-05-24: 1000 mL

## 2016-05-24 MED ORDER — METHOCARBAMOL 500 MG PO TABS
500.0000 mg | ORAL_TABLET | Freq: Four times a day (QID) | ORAL | Status: DC | PRN
  Administered 2016-05-25 – 2016-05-26 (×5): 500 mg via ORAL
  Filled 2016-05-24 (×5): qty 1

## 2016-05-24 MED ORDER — ONDANSETRON HCL 4 MG/2ML IJ SOLN
4.0000 mg | Freq: Four times a day (QID) | INTRAMUSCULAR | Status: DC | PRN
  Administered 2016-05-26: 4 mg via INTRAVENOUS
  Filled 2016-05-24: qty 2

## 2016-05-24 MED ORDER — ONDANSETRON HCL 4 MG PO TABS: 4.0000 mg | ORAL_TABLET | Freq: Four times a day (QID) | ORAL | Status: DC | PRN

## 2016-05-24 MED ORDER — TRANEXAMIC ACID 1000 MG/10ML IV SOLN
1000.0000 mg | INTRAVENOUS | Status: AC
  Administered 2016-05-24: 1000 mg via INTRAVENOUS
  Filled 2016-05-24: qty 1100

## 2016-05-24 MED ORDER — LIDOCAINE HCL (CARDIAC) 20 MG/ML IV SOLN
INTRAVENOUS | Status: DC | PRN
  Administered 2016-05-24: 100 mg via INTRAVENOUS

## 2016-05-24 MED ORDER — DIPHENHYDRAMINE HCL 12.5 MG/5ML PO ELIX: 12.5000 mg | ORAL_SOLUTION | ORAL | Status: DC | PRN

## 2016-05-24 MED ORDER — CHLORHEXIDINE GLUCONATE 4 % EX LIQD: 60.0000 mL | Freq: Once | CUTANEOUS | Status: DC

## 2016-05-24 MED ORDER — PROPOFOL 10 MG/ML IV BOLUS
INTRAVENOUS | Status: DC | PRN
  Administered 2016-05-24: 150 mg via INTRAVENOUS
  Administered 2016-05-24: 30 mg via INTRAVENOUS

## 2016-05-24 MED ORDER — ACETAMINOPHEN 325 MG PO TABS
650.0000 mg | ORAL_TABLET | Freq: Four times a day (QID) | ORAL | Status: DC | PRN
  Administered 2016-05-25 – 2016-05-26 (×2): 650 mg via ORAL
  Filled 2016-05-24 (×2): qty 2

## 2016-05-24 MED ORDER — OXYCODONE HCL 5 MG PO TABS
5.0000 mg | ORAL_TABLET | ORAL | Status: DC | PRN
  Administered 2016-05-24: 10 mg via ORAL
  Administered 2016-05-24 (×2): 5 mg via ORAL
  Administered 2016-05-25 – 2016-05-27 (×12): 10 mg via ORAL
  Filled 2016-05-24 (×2): qty 2
  Filled 2016-05-24 (×2): qty 1
  Filled 2016-05-24 (×5): qty 2
  Filled 2016-05-24: qty 1
  Filled 2016-05-24 (×2): qty 2
  Filled 2016-05-24: qty 1
  Filled 2016-05-24 (×2): qty 2
  Filled 2016-05-24: qty 1
  Filled 2016-05-24: qty 2

## 2016-05-24 MED ORDER — SODIUM CHLORIDE 0.9 % IJ SOLN
INTRAMUSCULAR | Status: AC
  Filled 2016-05-24: qty 10

## 2016-05-24 MED ORDER — PROMETHAZINE HCL 25 MG/ML IJ SOLN: 6.2500 mg | INTRAMUSCULAR | Status: DC | PRN

## 2016-05-24 MED ORDER — METHOCARBAMOL 1000 MG/10ML IJ SOLN
500.0000 mg | Freq: Four times a day (QID) | INTRAVENOUS | Status: DC | PRN
  Administered 2016-05-24: 500 mg via INTRAVENOUS
  Filled 2016-05-24: qty 550
  Filled 2016-05-24: qty 5

## 2016-05-24 MED ORDER — DOCUSATE SODIUM 100 MG PO CAPS
100.0000 mg | ORAL_CAPSULE | Freq: Two times a day (BID) | ORAL | Status: DC
  Administered 2016-05-24 – 2016-05-27 (×6): 100 mg via ORAL
  Filled 2016-05-24 (×6): qty 1

## 2016-05-24 MED ORDER — ASPIRIN EC 325 MG PO TBEC
325.0000 mg | DELAYED_RELEASE_TABLET | Freq: Two times a day (BID) | ORAL | Status: DC
  Administered 2016-05-25 – 2016-05-27 (×5): 325 mg via ORAL
  Filled 2016-05-24 (×5): qty 1

## 2016-05-24 MED ORDER — HYDROMORPHONE HCL 1 MG/ML IJ SOLN
0.2500 mg | INTRAMUSCULAR | Status: DC | PRN
  Administered 2016-05-24: 0.25 mg via INTRAVENOUS
  Administered 2016-05-24 (×2): 0.5 mg via INTRAVENOUS
  Administered 2016-05-24: 0.25 mg via INTRAVENOUS

## 2016-05-24 MED ORDER — METOCLOPRAMIDE HCL 5 MG PO TABS: 5.0000 mg | ORAL_TABLET | Freq: Three times a day (TID) | ORAL | Status: DC | PRN

## 2016-05-24 MED ORDER — BUPIVACAINE LIPOSOME 1.3 % IJ SUSP
20.0000 mL | Freq: Once | INTRAMUSCULAR | Status: DC
  Filled 2016-05-24: qty 20

## 2016-05-24 MED ORDER — SODIUM CHLORIDE 0.9 % IR SOLN
Status: DC | PRN
  Administered 2016-05-24 (×2): 1000 mL

## 2016-05-24 MED ORDER — CEFAZOLIN IN D5W 1 GM/50ML IV SOLN
1.0000 g | Freq: Four times a day (QID) | INTRAVENOUS | Status: AC
  Administered 2016-05-25: 1 g via INTRAVENOUS
  Filled 2016-05-24 (×2): qty 50

## 2016-05-24 MED ORDER — POLYETHYLENE GLYCOL 3350 17 G PO PACK: 17.0000 g | PACK | Freq: Every day | ORAL | Status: DC | PRN

## 2016-05-24 MED ORDER — CEFAZOLIN SODIUM-DEXTROSE 2-4 GM/100ML-% IV SOLN
INTRAVENOUS | Status: AC
  Filled 2016-05-24: qty 100

## 2016-05-24 MED ORDER — CEFAZOLIN SODIUM-DEXTROSE 2-4 GM/100ML-% IV SOLN
2.0000 g | INTRAVENOUS | Status: AC
  Administered 2016-05-24: 2 g via INTRAVENOUS
  Filled 2016-05-24: qty 100

## 2016-05-24 MED ORDER — FENTANYL CITRATE (PF) 100 MCG/2ML IJ SOLN
INTRAMUSCULAR | Status: DC | PRN
  Administered 2016-05-24 (×2): 50 ug via INTRAVENOUS
  Administered 2016-05-24: 100 ug via INTRAVENOUS
  Administered 2016-05-24 (×2): 50 ug via INTRAVENOUS

## 2016-05-24 MED ORDER — ACETAMINOPHEN 10 MG/ML IV SOLN
INTRAVENOUS | Status: AC
  Administered 2016-05-24: 1000 mg
  Filled 2016-05-24: qty 100

## 2016-05-24 MED ORDER — ACETAMINOPHEN 650 MG RE SUPP: 650.0000 mg | Freq: Four times a day (QID) | RECTAL | Status: DC | PRN

## 2016-05-24 MED ORDER — HYDROMORPHONE HCL 1 MG/ML IJ SOLN
0.5000 mg | INTRAMUSCULAR | Status: DC | PRN
  Administered 2016-05-24 – 2016-05-26 (×5): 0.5 mg via INTRAVENOUS
  Filled 2016-05-24 (×5): qty 1

## 2016-05-24 MED ORDER — HYDROMORPHONE HCL 1 MG/ML IJ SOLN
INTRAMUSCULAR | Status: AC
  Administered 2016-05-24: 0.5 mg via INTRAVENOUS
  Filled 2016-05-24: qty 1

## 2016-05-24 MED ORDER — SODIUM CHLORIDE 0.9 % IV SOLN
INTRAVENOUS | Status: DC
  Administered 2016-05-24: 75 mL/h via INTRAVENOUS

## 2016-05-24 MED ORDER — SUGAMMADEX SODIUM 200 MG/2ML IV SOLN
INTRAVENOUS | Status: AC
  Filled 2016-05-24: qty 2

## 2016-05-24 MED ORDER — MIDAZOLAM HCL 2 MG/2ML IJ SOLN
INTRAMUSCULAR | Status: AC
  Filled 2016-05-24: qty 2

## 2016-05-24 MED ORDER — ONDANSETRON HCL 4 MG/2ML IJ SOLN
INTRAMUSCULAR | Status: AC
  Filled 2016-05-24: qty 2

## 2016-05-24 MED ORDER — FENTANYL CITRATE (PF) 100 MCG/2ML IJ SOLN
INTRAMUSCULAR | Status: AC
  Filled 2016-05-24: qty 2

## 2016-05-24 MED ORDER — METOCLOPRAMIDE HCL 5 MG/ML IJ SOLN: 5.0000 mg | Freq: Three times a day (TID) | INTRAMUSCULAR | Status: DC | PRN

## 2016-05-24 MED ORDER — PROPOFOL 10 MG/ML IV BOLUS
INTRAVENOUS | Status: AC
  Filled 2016-05-24: qty 20

## 2016-05-24 MED ORDER — HYDROMORPHONE HCL 1 MG/ML IJ SOLN
INTRAMUSCULAR | Status: AC
  Filled 2016-05-24: qty 1

## 2016-05-24 MED ORDER — LIDOCAINE HCL (CARDIAC) 20 MG/ML IV SOLN
INTRAVENOUS | Status: AC
  Filled 2016-05-24: qty 5

## 2016-05-24 MED ORDER — LACTATED RINGERS IV SOLN
INTRAVENOUS | Status: DC
  Administered 2016-05-24 (×2): via INTRAVENOUS

## 2016-05-24 MED ORDER — MENTHOL 3 MG MT LOZG: 1.0000 | LOZENGE | OROMUCOSAL | Status: DC | PRN

## 2016-05-24 MED ORDER — EPHEDRINE SULFATE 50 MG/ML IJ SOLN
INTRAMUSCULAR | Status: AC
  Filled 2016-05-24: qty 1

## 2016-05-24 MED ORDER — ALUM & MAG HYDROXIDE-SIMETH 200-200-20 MG/5ML PO SUSP
30.0000 mL | ORAL | Status: DC | PRN
  Administered 2016-05-26: 30 mL via ORAL
  Filled 2016-05-24: qty 30

## 2016-05-24 MED ORDER — MIDAZOLAM HCL 2 MG/2ML IJ SOLN
INTRAMUSCULAR | Status: AC
  Administered 2016-05-24: 1 mg
  Filled 2016-05-24: qty 2

## 2016-05-24 MED ORDER — ROCURONIUM BROMIDE 100 MG/10ML IV SOLN
INTRAVENOUS | Status: AC
  Filled 2016-05-24: qty 1

## 2016-05-24 MED ORDER — KETOROLAC TROMETHAMINE 15 MG/ML IJ SOLN
7.5000 mg | Freq: Four times a day (QID) | INTRAMUSCULAR | Status: AC
  Administered 2016-05-24: 17:00:00 via INTRAVENOUS
  Administered 2016-05-25 (×3): 7.5 mg via INTRAVENOUS
  Filled 2016-05-24 (×4): qty 1

## 2016-05-24 MED ORDER — SUCCINYLCHOLINE CHLORIDE 20 MG/ML IJ SOLN
INTRAMUSCULAR | Status: DC | PRN
  Administered 2016-05-24: 100 mg via INTRAVENOUS

## 2016-05-24 MED ORDER — BUPIVACAINE-EPINEPHRINE (PF) 0.5% -1:200000 IJ SOLN
INTRAMUSCULAR | Status: DC | PRN
  Administered 2016-05-24: 30 mL via PERINEURAL

## 2016-05-24 MED ORDER — SUGAMMADEX SODIUM 200 MG/2ML IV SOLN
INTRAVENOUS | Status: DC | PRN
  Administered 2016-05-24: 150 mg via INTRAVENOUS

## 2016-05-24 MED ORDER — ROCURONIUM BROMIDE 100 MG/10ML IV SOLN
INTRAVENOUS | Status: DC | PRN
  Administered 2016-05-24: 30 mg via INTRAVENOUS

## 2016-05-24 MED ORDER — BUPIVACAINE-EPINEPHRINE (PF) 0.5% -1:200000 IJ SOLN
INTRAMUSCULAR | Status: AC
  Filled 2016-05-24: qty 30

## 2016-05-24 MED ORDER — MIDAZOLAM HCL 2 MG/2ML IJ SOLN
1.0000 mg | Freq: Once | INTRAMUSCULAR | Status: AC
  Administered 2016-05-24: 1 mg via INTRAVENOUS

## 2016-05-24 MED ORDER — CALCIUM CARBONATE-VITAMIN D 500-200 MG-UNIT PO TABS
1.0000 | ORAL_TABLET | Freq: Every day | ORAL | Status: DC
  Administered 2016-05-25 – 2016-05-26 (×2): 1 via ORAL
  Filled 2016-05-24 (×4): qty 1

## 2016-05-24 MED ORDER — ONDANSETRON HCL 4 MG/2ML IJ SOLN
INTRAMUSCULAR | Status: DC | PRN
  Administered 2016-05-24: 4 mg via INTRAVENOUS

## 2016-05-24 SURGICAL SUPPLY — 56 items
BAG ZIPLOCK 12X15 (MISCELLANEOUS) IMPLANT
BANDAGE ACE 6X5 VEL STRL LF (GAUZE/BANDAGES/DRESSINGS) ×3 IMPLANT
BEARIN TIBIAL TRIATH (Orthopedic Implant) ×3 IMPLANT
BEARING TIBIAL TRIATH (Orthopedic Implant) ×1 IMPLANT
BENZOIN TINCTURE PRP APPL 2/3 (GAUZE/BANDAGES/DRESSINGS) ×3 IMPLANT
BLADE SAG 13.0X1.37X90 (BLADE) IMPLANT
BLADE SAG 18X100X1.27 (BLADE) IMPLANT
BOWL SMART MIX CTS (DISPOSABLE) ×3 IMPLANT
CAPT KNEE TOTAL 3 ×3 IMPLANT
CEMENT BONE SIMPLEX SPEEDSET (Cement) ×6 IMPLANT
CLOSURE WOUND 1/2 X4 (GAUZE/BANDAGES/DRESSINGS) ×1
CLOTH BEACON ORANGE TIMEOUT ST (SAFETY) ×3 IMPLANT
CUFF TOURN SGL QUICK 34 (TOURNIQUET CUFF) ×2
CUFF TRNQT CYL 34X4X40X1 (TOURNIQUET CUFF) ×1 IMPLANT
DRAPE U-SHAPE 47X51 STRL (DRAPES) ×3 IMPLANT
DRSG AQUACEL AG ADV 3.5X10 (GAUZE/BANDAGES/DRESSINGS) ×3 IMPLANT
DRSG PAD ABDOMINAL 8X10 ST (GAUZE/BANDAGES/DRESSINGS) ×3 IMPLANT
DRSG XEROFORM 1X8 (GAUZE/BANDAGES/DRESSINGS) ×3 IMPLANT
DURAPREP 26ML APPLICATOR (WOUND CARE) ×3 IMPLANT
ELECT REM PT RETURN 9FT ADLT (ELECTROSURGICAL) ×3
ELECTRODE REM PT RTRN 9FT ADLT (ELECTROSURGICAL) ×1 IMPLANT
GAUZE SPONGE 4X4 12PLY STRL (GAUZE/BANDAGES/DRESSINGS) ×3 IMPLANT
GAUZE XEROFORM 1X8 LF (GAUZE/BANDAGES/DRESSINGS) IMPLANT
GLOVE BIO SURGEON STRL SZ7.5 (GLOVE) ×3 IMPLANT
GLOVE BIOGEL PI IND STRL 8 (GLOVE) ×7 IMPLANT
GLOVE BIOGEL PI INDICATOR 8 (GLOVE) ×14
GLOVE ECLIPSE 8.0 STRL XLNG CF (GLOVE) ×3 IMPLANT
GOWN STRL REUS W/TWL XL LVL3 (GOWN DISPOSABLE) ×12 IMPLANT
HANDPIECE INTERPULSE COAX TIP (DISPOSABLE) ×2
IMMOBILIZER KNEE 20 (SOFTGOODS) ×6 IMPLANT
IMMOBILIZER KNEE 20 THIGH 36 (SOFTGOODS) ×1 IMPLANT
INSERT TIB 4 16XPOST STAB BRNG (Insert) ×1 IMPLANT
KNEE INSERT TIB 4X16 (Insert) ×2 IMPLANT
NS IRRIG 1000ML POUR BTL (IV SOLUTION) ×3 IMPLANT
PACK TOTAL KNEE CUSTOM (KITS) ×3 IMPLANT
PAD ABD 8X10 STRL (GAUZE/BANDAGES/DRESSINGS) ×3 IMPLANT
PADDING CAST COTTON 6X4 STRL (CAST SUPPLIES) ×3 IMPLANT
POSITIONER SURGICAL ARM (MISCELLANEOUS) ×3 IMPLANT
SET HNDPC FAN SPRY TIP SCT (DISPOSABLE) ×1 IMPLANT
SET PAD KNEE POSITIONER (MISCELLANEOUS) ×3 IMPLANT
STAPLER VISISTAT 35W (STAPLE) IMPLANT
STRIP CLOSURE SKIN 1/2X4 (GAUZE/BANDAGES/DRESSINGS) ×2 IMPLANT
SUCTION FRAZIER HANDLE 12FR (TUBING) ×2
SUCTION TUBE FRAZIER 12FR DISP (TUBING) ×1 IMPLANT
SUT MNCRL AB 4-0 PS2 18 (SUTURE) IMPLANT
SUT VIC AB 0 CT1 27 (SUTURE) ×2
SUT VIC AB 0 CT1 27XBRD ANTBC (SUTURE) ×1 IMPLANT
SUT VIC AB 1 CT1 27 (SUTURE) ×4
SUT VIC AB 1 CT1 27XBRD ANTBC (SUTURE) ×2 IMPLANT
SUT VIC AB 2-0 CT1 27 (SUTURE) ×4
SUT VIC AB 2-0 CT1 TAPERPNT 27 (SUTURE) ×2 IMPLANT
TRAY FOLEY W/METER SILVER 14FR (SET/KITS/TRAYS/PACK) IMPLANT
TRAY FOLEY W/METER SILVER 16FR (SET/KITS/TRAYS/PACK) IMPLANT
WATER STERILE IRR 1500ML POUR (IV SOLUTION) ×6 IMPLANT
WRAP KNEE MAXI GEL POST OP (GAUZE/BANDAGES/DRESSINGS) ×3 IMPLANT
YANKAUER SUCT BULB TIP 10FT TU (MISCELLANEOUS) ×3 IMPLANT

## 2016-05-24 NOTE — Anesthesia Preprocedure Evaluation (Addendum)
Anesthesia Evaluation  Patient identified by MRN, date of birth, ID band Patient awake    Reviewed: Allergy & Precautions, NPO status , Patient's Chart, lab work & pertinent test results  History of Anesthesia Complications Negative for: history of anesthetic complications  Airway Mallampati: II  TM Distance: >3 FB Neck ROM: Full    Dental  (+) Teeth Intact   Pulmonary neg pulmonary ROS, former smoker,    breath sounds clear to auscultation       Cardiovascular negative cardio ROS   Rhythm:Regular Rate:Normal     Neuro/Psych negative neurological ROS     GI/Hepatic GERD  ,  Endo/Other  negative endocrine ROS  Renal/GU Renal disease     Musculoskeletal  (+) Arthritis ,   Abdominal   Peds  Hematology negative hematology ROS (+)   Anesthesia Other Findings   Reproductive/Obstetrics                            Anesthesia Physical Anesthesia Plan  ASA: II  Anesthesia Plan: General   Post-op Pain Management: GA combined w/ Regional for post-op pain   Induction: Intravenous  Airway Management Planned: Simple Face Mask and Natural Airway  Additional Equipment:   Intra-op Plan:   Post-operative Plan:   Informed Consent: I have reviewed the patients History and Physical, chart, labs and discussed the procedure including the risks, benefits and alternatives for the proposed anesthesia with the patient or authorized representative who has indicated his/her understanding and acceptance.     Plan Discussed with: CRNA  Anesthesia Plan Comments:        Anesthesia Quick Evaluation

## 2016-05-24 NOTE — Transfer of Care (Signed)
Immediate Anesthesia Transfer of Care Note  Patient: Joshua Strong  Procedure(s) Performed: Procedure(s): RIGHT TOTAL KNEE ARTHROPLASTY (Right)  Patient Location: PACU  Anesthesia Type:General  Level of Consciousness:  sedated, patient cooperative and responds to stimulation  Airway & Oxygen Therapy:Patient Spontanous Breathing and Patient connected to face mask oxgen  Post-op Assessment:  Report given to PACU RN and Post -op Vital signs reviewed and stable  Post vital signs:  Reviewed and stable  Last Vitals:  Vitals:   05/24/16 1149 05/24/16 1150  BP:    Pulse: 69 71  Resp:    Temp:      Complications: No apparent anesthesia complications

## 2016-05-24 NOTE — Brief Op Note (Signed)
05/24/2016  1:48 PM  PATIENT:  Joshua Strong  82 y.o. male  PRE-OPERATIVE DIAGNOSIS:  right knee osteoarthritis  POST-OPERATIVE DIAGNOSIS:  right knee osteoarthritis  PROCEDURE:  Procedure(s): RIGHT TOTAL KNEE ARTHROPLASTY (Right)  SURGEON:  Surgeon(s) and Role:    * Mcarthur Rossetti, MD - Primary  PHYSICIAN ASSISTANT: Benita Stabile, PA-C  ANESTHESIA:   regional and general  EBL:  Total I/O In: 1000 [I.V.:1000] Out: 50 [Blood:50]  COUNTS:  YES  TOURNIQUET:  * Missing tourniquet times found for documented tourniquets in log:  [id number] *  PLAN OF CARE: Admit to inpatient   PATIENT DISPOSITION:  PACU - hemodynamically stable.   Delay start of Pharmacological VTE agent (>24hrs) due to surgical blood loss or risk of bleeding: no

## 2016-05-24 NOTE — Progress Notes (Signed)
AssistedDr. Massagee with right, ultrasound guided, adductor canal block. Side rails up, monitors on throughout procedure. See vital signs in flow sheet. Tolerated Procedure well.  

## 2016-05-24 NOTE — H&P (Signed)
TOTAL KNEE ADMISSION H&P  Patient is being admitted for right total knee arthroplasty.  Subjective:  Chief Complaint:right knee pain.  HPI: Joshua Strong, 82 y.o. male, has a history of pain and functional disability in the right knee due to arthritis and has failed non-surgical conservative treatments for greater than 12 weeks to includeNSAID's and/or analgesics, corticosteriod injections, viscosupplementation injections, flexibility and strengthening excercises, use of assistive devices, weight reduction as appropriate and activity modification.  Onset of symptoms was gradual, starting 8 years ago with gradually worsening course since that time. The patient noted no past surgery on the right knee(s).  Patient currently rates pain in the right knee(s) at 10 out of 10 with activity. Patient has night pain, worsening of pain with activity and weight bearing, pain that interferes with activities of daily living, pain with passive range of motion, crepitus and joint swelling.  Patient has evidence of subchondral sclerosis, periarticular osteophytes and joint space narrowing by imaging studies. There is no active infection.  Patient Active Problem List   Diagnosis Date Noted  . Osteoarthritis of right knee 05/24/2016  . Diverticulosis of colon without hemorrhage   . CONSTIPATION 04/19/2010  . ABDOMINAL PAIN, LEFT LOWER QUADRANT 09/18/2009  . EPIGASTRIC PAIN 09/18/2009  . CARCINOMA, RENAL CELL 08/08/2008  . PNEUMONIA 08/08/2008  . ACUTE PANCREATITIS 08/08/2008  . NAUSEA 08/08/2008  . ABDOMINAL PAIN, CHRONIC 08/08/2008   Past Medical History:  Diagnosis Date  . GERD (gastroesophageal reflux disease)   . GI bleed    ulcer  . Hemorrhoid   . Pancreatitis   . Pneumonia   . Renal cell carcinoma ([place])   . Tubular adenoma   . Tubular adenoma 2013    Past Surgical History:  Procedure Laterality Date  . CHOLECYSTECTOMY    . COLONOSCOPY  06/16/2007   Dr. Gala Romney- minimal internal hemorrhoids o/w  normal rectum,tubular adenoma  . COLONOSCOPY  07/02/2012   Dr.Rourk- normal rectum, 3 5-7 mm peduncuated polyos, one in the mid sigmoid segment and 2 in the ascending segment. remainder of the colonic mucosa appeared normal. bx= tubular adenoma  . COLONOSCOPY N/A 07/17/2015   Procedure: COLONOSCOPY;  Surgeon: Daneil Dolin, MD;  Location: [facility];  Service: Endoscopy;  Laterality: N/A;  730   . EYE SURGERY     cataract with lens implants  . HAND SURGERY    . KNEE SURGERY     right  . NEPHRECTOMY Right   . pancreatic duct stent    . SHOULDER SURGERY     left  . SPINAL FUSION    . testicular tumor      No prescriptions prior to admission.   Allergies  Allergen Reactions  . Morphine And Related Other (See Comments)    Hallucinations.  . Metoclopramide Hcl Rash    Social History  Substance Use Topics  . Smoking status: Former Smoker    Packs/day: 0.50    Years: 3.00    Types: Cigarettes    Quit date: 05/17/1956  . Smokeless tobacco: Never Used     Comment: quit in 1958  . Alcohol use No    Family History  Problem Relation Age of Onset  . Adopted: Yes     Review of Systems  Musculoskeletal: Positive for joint pain.  All other systems reviewed and are negative.   Objective:  Physical Exam  Constitutional: He is oriented to person, place, and time. He appears well-developed and well-nourished.  HENT:  Head: Normocephalic and atraumatic.  Eyes: EOM are normal. Pupils are equal, round, and reactive to light.  Neck: Normal range of motion. Neck supple.  Cardiovascular: Regular rhythm.   Respiratory: Effort normal and breath sounds normal.  GI: Soft. Bowel sounds are normal.  Musculoskeletal:       Right knee: He exhibits decreased range of motion, swelling, effusion and ecchymosis. Tenderness found. Medial joint line and lateral joint line tenderness noted.  Neurological: He is alert and oriented to person, place, and time.  Skin: Skin is warm and dry.   Psychiatric: He has a normal mood and affect.    Vital signs in last 24 hours:    Labs:   Estimated body mass index is 26.79 kg/m as calculated from the following:   Height as of 05/17/16: 5\' 6"  (1.676 m).   Weight as of 05/17/16: 75.3 kg (166 lb).   Imaging Review Plain radiographs demonstrate severe degenerative joint disease of the right knee(s). The overall alignment ismild varus. The bone quality appears to be good for age and reported activity level.  Assessment/Plan:  End stage arthritis, right knee   The patient history, physical examination, clinical judgment of the provider and imaging studies are consistent with end stage degenerative joint disease of the right knee(s) and total knee arthroplasty is deemed medically necessary. The treatment options including medical management, injection therapy arthroscopy and arthroplasty were discussed at length. The risks and benefits of total knee arthroplasty were presented and reviewed. The risks due to aseptic loosening, infection, stiffness, patella tracking problems, thromboembolic complications and other imponderables were discussed. The patient acknowledged the explanation, agreed to proceed with the plan and consent was signed. Patient is being admitted for inpatient treatment for surgery, pain control, PT, OT, prophylactic antibiotics, VTE prophylaxis, progressive ambulation and ADL's and discharge planning. The patient is planning to be discharged home with home health services vs skilled nursing placement pending his post-op recovery and progress with mobility

## 2016-05-24 NOTE — Anesthesia Procedure Notes (Signed)
Procedure Name: Intubation Date/Time: 05/24/2016 12:05 PM Performed by: Maxwell Caul Pre-anesthesia Checklist: Patient identified, Emergency Drugs available, Suction available and Patient being monitored Patient Re-evaluated:Patient Re-evaluated prior to inductionOxygen Delivery Method: Circle system utilized Preoxygenation: Pre-oxygenation with 100% oxygen Intubation Type: IV induction Ventilation: Mask ventilation without difficulty Laryngoscope Size: Mac and 4 Grade View: Grade I Tube type: Oral Tube size: 8.0 mm Number of attempts: 1 Airway Equipment and Method: Stylet Placement Confirmation: ETT inserted through vocal cords under direct vision,  positive ETCO2 and breath sounds checked- equal and bilateral Secured at: 21 cm Tube secured with: Tape Dental Injury: Teeth and Oropharynx as per pre-operative assessment

## 2016-05-24 NOTE — Anesthesia Procedure Notes (Signed)
Anesthesia Regional Block:  Adductor canal block  Pre-Anesthetic Checklist: ,, timeout performed, Correct Patient, Correct Site, Correct Laterality, Correct Procedure, Correct Position, site marked, Risks and benefits discussed,  Surgical consent,  Pre-op evaluation,  At surgeon's request and post-op pain management  Laterality: Right and Lower  Prep: chloraprep       Needles:   Needle Type: Stimulator Needle - 80     Needle Length: 9cm 9 cm Needle Gauge: 21 and 21 G  Needle insertion depth: 5 cm   Additional Needles:  Procedures: ultrasound guided (picture in chart) Adductor canal block Narrative:  Start time: 05/24/2016 11:40 AM End time: 05/24/2016 11:50 AM Injection made incrementally with aspirations every 5 mL.  Performed by: Personally  Anesthesiologist: Aamina Skiff  Additional Notes: Tolerated well

## 2016-05-24 NOTE — Anesthesia Postprocedure Evaluation (Signed)
Anesthesia Post Note  Patient: Joshua Strong  Procedure(s) Performed: Procedure(s) (LRB): RIGHT TOTAL KNEE ARTHROPLASTY (Right)  Patient location during evaluation: PACU Anesthesia Type: General Level of consciousness: awake and alert Pain management: pain level controlled Vital Signs Assessment: post-procedure vital signs reviewed and stable Respiratory status: spontaneous breathing, nonlabored ventilation, respiratory function stable and patient connected to nasal cannula oxygen Cardiovascular status: blood pressure returned to baseline and stable Postop Assessment: no signs of nausea or vomiting Anesthetic complications: no    Last Vitals:  Vitals:   05/24/16 1615 05/24/16 1629  BP: (!) 160/88 (!) 187/86  Pulse: 66 64  Resp: 12 12  Temp:  36.6 C    Last Pain:  Vitals:   05/24/16 1656  TempSrc:   PainSc: 7                  Braelynne Garinger,JAMES TERRILL

## 2016-05-25 LAB — CBC
HCT: 39.2 % (ref 39.0–52.0)
HEMOGLOBIN: 13 g/dL (ref 13.0–17.0)
MCH: 30.4 pg (ref 26.0–34.0)
MCHC: 33.2 g/dL (ref 30.0–36.0)
MCV: 91.6 fL (ref 78.0–100.0)
PLATELETS: 184 10*3/uL (ref 150–400)
RBC: 4.28 MIL/uL (ref 4.22–5.81)
RDW: 13.5 % (ref 11.5–15.5)
WBC: 11.2 10*3/uL — ABNORMAL HIGH (ref 4.0–10.5)

## 2016-05-25 LAB — BASIC METABOLIC PANEL
Anion gap: 4 — ABNORMAL LOW (ref 5–15)
BUN: 24 mg/dL — AB (ref 6–20)
CALCIUM: 9 mg/dL (ref 8.9–10.3)
CHLORIDE: 103 mmol/L (ref 101–111)
CO2: 31 mmol/L (ref 22–32)
CREATININE: 1.54 mg/dL — AB (ref 0.61–1.24)
GFR, EST AFRICAN AMERICAN: 46 mL/min — AB (ref >60)
GFR, EST NON AFRICAN AMERICAN: 40 mL/min — AB (ref >60)
Glucose, Bld: 116 mg/dL — ABNORMAL HIGH (ref 65–99)
Potassium: 4.7 mmol/L (ref 3.5–5.1)
SODIUM: 138 mmol/L (ref 135–145)

## 2016-05-25 MED ORDER — ASPIRIN 325 MG PO TBEC: 325.0000 mg | DELAYED_RELEASE_TABLET | Freq: Two times a day (BID) | ORAL | 0 refills | Status: DC

## 2016-05-25 MED ORDER — METHOCARBAMOL 500 MG PO TABS: 500.0000 mg | ORAL_TABLET | Freq: Four times a day (QID) | ORAL | 0 refills | Status: DC | PRN

## 2016-05-25 MED ORDER — OXYCODONE-ACETAMINOPHEN 5-325 MG PO TABS: 1.0000 | ORAL_TABLET | ORAL | 0 refills | Status: DC | PRN

## 2016-05-25 NOTE — Progress Notes (Signed)
Occupational Therapy Evaluation Patient Details Name: Joshua Strong MRN: [id number] DOB: [birth year] Today's Date: 05/25/2016    History of Present Illness s/p RIGHT TOTAL KNEE ARTHROPLASTY (Right)   Clinical Impression   Patient with poor tolerance to mobility resulting in dizziness, only tolerated bed mobility and standing briefly and required extensive assistance with mobility/ADLs. At this time, recommend SNF. Will follow and progress ADLs as tolerated and update d/c recs as necessary.    Follow Up Recommendations  SNF;Supervision/Assistance - 24 hour    Equipment Recommendations  3 in 1 bedside comode    Recommendations for Other Services       Precautions / Restrictions Precautions Precautions: Fall;Knee Precaution Comments: reports falling at home multiple times PTA Required Braces or Orthoses: Knee Immobilizer - Right Knee Immobilizer - Right: Discontinue once straight leg raise with < 10 degree lag Restrictions Weight Bearing Restrictions: No Other Position/Activity Restrictions: WBAT      Mobility Bed Mobility Overal bed mobility: Needs Assistance;+2 for physical assistance;+ 2 for safety/equipment Bed Mobility: Supine to Sit;Sit to Supine     Supine to sit: Mod assist;+2 for physical assistance;+2 for safety/equipment;HOB elevated Sit to supine: Max assist;+2 for physical assistance;+2 for safety/equipment      Transfers Overall transfer level: Needs assistance Equipment used: Rolling walker (2 wheeled) Transfers: Sit to/from Stand Sit to Stand: Mod assist;Max assist;+2 physical assistance;+2 safety/equipment;From elevated surface         General transfer comment: needed assistance to rise, stabilize, control descent, move RLE out in front to sit    Balance                                            ADL Overall ADL's : Needs assistance/impaired Eating/Feeding: Set up;Bed level   Grooming: Wash/dry face;Set up;Bed level            Upper Body Dressing : Moderate assistance;Sitting   Lower Body Dressing: Total assistance;Bed level               Functional mobility during ADLs: Moderate assistance;Maximal assistance;+2 for physical assistance;+2 for safety/equipment;Rolling walker General ADL Comments: Patient sat EOB with mod A +2, then reported dizziness, BP 128/54 sitting EOB, sit to stand mod/max A +2 to RW. Standing reported increased dizziness, BP standing 111/60. Reported he was sweating, returned to sitting, then supine max A +2. BP taken in supine 128/56. RN made aware. Patient requiring a significant amount of assistance for mobility/ADLs at this time.     Vision     Perception     Praxis      Pertinent Vitals/Pain Pain Assessment: 0-10 Pain Score: 8  Pain Location: R knee Pain Descriptors / Indicators: Aching;Sore Pain Intervention(s): Limited activity within patient's tolerance;Premedicated before session;Repositioned;Monitored during session     Hand Dominance     Extremity/Trunk Assessment Upper Extremity Assessment Upper Extremity Assessment: Generalized weakness   Lower Extremity Assessment Lower Extremity Assessment: Defer to PT evaluation   Cervical / Trunk Assessment Cervical / Trunk Assessment: Kyphotic   Communication Communication Communication: No difficulties   Cognition Arousal/Alertness: Awake/alert Behavior During Therapy: WFL for tasks assessed/performed Overall Cognitive Status: Within Functional Limits for tasks assessed                     General Comments       Exercises       Shoulder  Instructions      Home Living Family/patient expects to be discharged to:: Private residence Living Arrangements: Spouse/significant other Available Help at Discharge: Family Type of Home: House Home Access: Stairs to enter [employer] of Steps: 4 Entrance Stairs-Rails: Right Home Layout: Two level;Able to live on main level with  bedroom/bathroom     Bathroom Shower/Tub: Occupational psychologist: Standard     Home Equipment: Cane - single point          Prior Functioning/Environment Level of Independence: Independent             OT Diagnosis: Generalized weakness;Acute pain   OT Problem List: Decreased strength;Decreased range of motion;Decreased activity tolerance;Impaired balance (sitting and/or standing);Decreased safety awareness;Decreased knowledge of use of DME or AE;Pain   OT Treatment/Interventions: Self-care/ADL training;DME and/or AE instruction;Therapeutic activities;Patient/family education    OT Goals(Current goals can be found in the care plan section) Acute Rehab OT Goals OT Goal Formulation: With patient Time For Goal Achievement: 06/08/16 Potential to Achieve Goals: Good ADL Goals Pt Will Perform Lower Body Bathing: with min assist;sit to/from stand Pt Will Perform Lower Body Dressing: with min assist;sit to/from stand Pt Will Transfer to Toilet: with min assist;ambulating;bedside commode Pt Will Perform Toileting - Clothing Manipulation and hygiene: with min assist;sit to/from stand  OT Frequency: Min 2X/week   Barriers to D/C:            Co-evaluation PT/OT/SLP Co-Evaluation/Treatment: Yes Reason for Co-Treatment: For patient/therapist safety PT goals addressed during session: Mobility/safety with mobility OT goals addressed during session: ADL's and self-care      End of Session Equipment Utilized During Treatment: Rolling walker;Right knee immobilizer Nurse Communication: Mobility status;Other (comment) (dizziness with mobility)  Activity Tolerance: Other (comment) (limited by dizziness) Patient left: in bed;with call bell/phone within reach;with family/visitor present   Time: 1246-1318 OT Time Calculation (min): 32 min Charges:  OT General Charges $OT Visit: 1 Procedure OT Evaluation $OT Eval Moderate Complexity: 1 Procedure G-Codes:    Kathren Scearce  A 2016-06-03, 1:32 PM

## 2016-05-25 NOTE — Op Note (Signed)
[name], [name] NO.:  [id number]  MEDICAL RECORD NO.:  [id number]  LOCATION:  [id number]                         FACILITY:  [facility]  PHYSICIAN:  Lind Guest. Ninfa Linden, M.D.DATE OF BIRTH:  08/19/1931  DATE OF PROCEDURE:  05/24/2016 DATE OF DISCHARGE:                              OPERATIVE REPORT   PREOPERATIVE DIAGNOSES:  Primary osteoarthritis and degenerative joint disease, right knee.  POSTOPERATIVE DIAGNOSES:  Primary osteoarthritis and degenerative joint disease, right knee.  PROCEDURE:  Right total knee arthroplasty.  IMPLANTS:  Stryker Triathlon knee with size 4 femur, size 4 tibial tray, 16 mm fix-bearing polyethylene insert, size 38 patellar button.  SURGEON:  Lind Guest. Ninfa Linden, M.D.  ASSISTANT:  Erskine Emery, PA-C.  ANESTHESIA: 1. Regional right leg adductor canal block. 2. General.  BLOOD LOSS:  Less than 100 mL.  TOURNIQUET TIME:  Under an hour and a half.  COMPLICATIONS:  None.  INDICATIONS:  Joshua Strong is an 82 year old gentleman with osteoarthritis involving his right knee that has been followed for many years now.  He is a very active 82 years old who has gotten to a point where his pain has detrimentally affected his activities of daily living, his mobility, and his quality of life.  At this point, he wishes to proceed with a total knee arthroplasty.  His disease is mainly in the medial joint line, but he has varus deformity that cannot correct well and needs a total knee as opposed to a partial knee.  He has tried and failed all forms of conservative treatment.  At this point, he understands the risk of acute blood loss anemia, nerve and vessel injury, fracture, infection, dislocation, and DVT.  He understands our goals are decreased pain, improved mobility, and overall improved quality of life.  PROCEDURE DESCRIPTION:  After informed consent was obtained, appropriate right knee was marked.  He was brought to the operating  room, adductor canal block was obtained in the holding area by Anesthesia as well. General anesthesia was then obtained.  Once he was placed supine on the operating table, a nonsterile tourniquet was placed around his upper right thigh and his right leg was prepped and draped with DuraPrep and sterile drapes including sterile stockinette.  Time-out was called and he was identified as correct patient, correct right knee.  I then used Esmarch to wrap out the leg and tourniquet was inflated to 300 mm of pressure.  We then made a direct midline incision over the patella and carried this proximally and distally.  We dissected down the knee joint and carried out a medial parapatellar arthrotomy.  We found significant medial compartment arthritis of the knee little bit and significant in the patellofemoral joint as well.  With the knee in a flexed position after removing osteophytes from the knee, the medial and lateral meniscus as well as ACL and PCL, we used our extramedullary cutting guide to take 9 mm off the high side.  We then had to take additional 2 mm due to severe sclerotic bone.  We then went to the femur and using an intramedullary guide set at 5 degrees externally rotated for right knee, we made  our distal femoral cut at 8 mm.  We brought the knee back down in extension.  Once we cleaned debris from the knee, he actually hyperextended.  We went back to the femur and put our femoral sizing guide and chose a size 4 femur and made our 4-in-1 cutting block for size 4 femur and made our femoral box cut.  We then back to the tibia and then made our tibia keel cut based off the rotation of the tibial tubercle and the femur using a size 4 tibia.  With the size 4 tibia in place, followed by the 4 femur, we trialed several polyethylene inserts and due to the posterior lateral corner instability, we went with a thicker poly.  I look at his popliteus tendon, it was intact.  Medial collateral  ligament was intact as well.  The lateral collateral ligament was intact as well.  We then irrigated the knee and removed all trial components from the knee and any other debris.  We mixed our cement and cemented the real Stryker Triathlon tibial tray size 4 followed by the size real femur.  We placed first 11 mm fixed-bearing polyethylene insert, but we felt this offered still more instability, so once the cement had hardened, we removed that polyethylene, had to go up to a 16, and the 16 is where we felt most comfortable with stability.  He had full extension as well.  We then irrigated the knee with normal saline solution again.  Once we placed the real polyethylene insert and cemented our patellar button as well.  We cleaned cement debris from the knee and let the tourniquet down.  Hemostasis was obtained with electrocautery.  We closed the arthrotomy with interrupted #1 Vicryl suture followed by 0-Vicryl in the deep tissue, 2-0 Vicryl in the subcutaneous tissue, 4-0 Monocryl subcuticular stitch and Steri-Strips on the skin.  Well-padded sterile dressing was applied.  He was awakened, extubated, and taken to the recovery room in stable condition. All final counts were correct.  There were no complications noted.  Of note, Erskine Emery, PA-C assisted in the entire case.  His assistance was crucial for facilitating all aspects of this case.     Lind Guest. Ninfa Linden, M.D.     CYB/MEDQ  D:  05/24/2016  T:  05/25/2016  Job:  [id number]

## 2016-05-25 NOTE — Progress Notes (Signed)
Physical Therapy Treatment Patient Details Name: Joshua Strong MRN: [id number] DOB: 1931-01-21 Today's Date: 05/25/2016    History of Present Illness s/p RIGHT TOTAL KNEE ARTHROPLASTY (Right)    PT Comments    Therex program initiated  Follow Up Recommendations  SNF     Equipment Recommendations  Rolling walker with 5" wheels    Recommendations for Other Services OT consult     Precautions / Restrictions Precautions Precautions: Fall;Knee Precaution Comments: reports falling at home multiple times PTA Required Braces or Orthoses: Knee Immobilizer - Right Knee Immobilizer - Right: Discontinue once straight leg raise with < 10 degree lag Restrictions Weight Bearing Restrictions: No Other Position/Activity Restrictions: WBAT    Mobility  Bed Mobility Overal bed mobility: Needs Assistance;+2 for physical assistance;+ 2 for safety/equipment Bed Mobility: Supine to Sit;Sit to Supine     Supine to sit: Mod assist;+2 for physical assistance;+2 for safety/equipment;HOB elevated Sit to supine: Max assist;+2 for physical assistance;+2 for safety/equipment   General bed mobility comments: cues for sequence and use of L LE to self assist.    Transfers Overall transfer level: Needs assistance Equipment used: Rolling walker (2 wheeled) Transfers: Sit to/from Stand Sit to Stand: Mod assist;Max assist;+2 physical assistance;+2 safety/equipment;From elevated surface         General transfer comment: needed assistance to rise, stabilize, control descent, move RLE out in front to sit  Ambulation/Gait             General Gait Details: NT - pt c/o increasing dizziness and becoming diaphoretic while standing and returned to bed   Stairs            Wheelchair Mobility    Modified Rankin (Stroke Patients Only)       Balance                                    Cognition Arousal/Alertness: Awake/alert Behavior During Therapy: WFL for tasks  assessed/performed Overall Cognitive Status: Within Functional Limits for tasks assessed       Memory: Decreased short-term memory              Exercises Total Joint Exercises Ankle Circles/Pumps: AROM;Both;15 reps;Supine Quad Sets: AROM;Both;10 reps;Supine Heel Slides: AAROM;Right;15 reps;Supine Straight Leg Raises: AAROM;Right;10 reps;Supine    General Comments        Pertinent Vitals/Pain Pain Assessment: 0-10 Pain Score: 8  Pain Location: R knee Pain Descriptors / Indicators: Aching;Sore Pain Intervention(s): Limited activity within patient's tolerance;Monitored during session;Premedicated before session;Ice applied    Home Living Family/patient expects to be discharged to:: Private residence Living Arrangements: Spouse/significant other Available Help at Discharge: Family Type of Home: House Home Access: Stairs to enter Entrance Stairs-Rails: Right Home Layout: Two level;Able to live on main level with bedroom/bathroom Home Equipment: [name] - single point      Prior Function Level of Independence: Independent          PT Goals (current goals can now be found in the care plan section) Acute Rehab PT Goals Patient Stated Goal: Regain IND PT Goal Formulation: With patient Time For Goal Achievement: 06/01/16 Potential to Achieve Goals: Fair Progress towards PT goals: Progressing toward goals    Frequency  7X/week    PT Plan Current plan remains appropriate    Co-evaluation PT/OT/SLP Co-Evaluation/Treatment: Yes Reason for Co-Treatment: For patient/therapist safety PT goals addressed during session: Mobility/safety with mobility OT goals addressed during session: ADL's  and self-care     End of Session Equipment Utilized During Treatment: Gait belt;Right knee immobilizer Activity Tolerance: Patient limited by fatigue;Patient limited by pain Patient left: in bed;with call bell/phone within reach;with family/visitor present     Time: [id number] PT  Time Calculation (min) (ACUTE ONLY): 21 min  Charges:  $Therapeutic Exercise: 8-22 mins                    G Codes:      Joshua Strong 06/12/2016, 4:42 PM

## 2016-05-25 NOTE — Evaluation (Signed)
Physical Therapy Evaluation Patient Details Name: Joshua Strong MRN: [id number] DOB: 1930/10/19 Today's Date: 05/25/2016   History of Present Illness  s/p RIGHT TOTAL KNEE ARTHROPLASTY (Right)  Clinical Impression  Pt s/p R TKR presents with decreased R LE strength/ROM, post op pain and premorbid deconditioning limiting functional mobility.  This date, pt with poor tolerance to mobility resulting in dizziness, only tolerated bed mobility and standing briefly and required extensive assistance with mobility/ADLs. At this time, recommend SNF. Will follow and progress mobility as tolerated and update d/c recs as necessary.  Follow Up Recommendations SNF    Equipment Recommendations  Rolling walker with 5" wheels    Recommendations for Other Services OT consult     Precautions / Restrictions Precautions Precautions: Fall;Knee Precaution Comments: reports falling at home multiple times PTA Required Braces or Orthoses: Knee Immobilizer - Right Knee Immobilizer - Right: Discontinue once straight leg raise with < 10 degree lag Restrictions Weight Bearing Restrictions: No Other Position/Activity Restrictions: WBAT      Mobility  Bed Mobility Overal bed mobility: Needs Assistance;+2 for physical assistance;+ 2 for safety/equipment Bed Mobility: Supine to Sit;Sit to Supine     Supine to sit: Mod assist;+2 for physical assistance;+2 for safety/equipment;HOB elevated Sit to supine: Max assist;+2 for physical assistance;+2 for safety/equipment   General bed mobility comments: cues for sequence and use of L LE to self assist.    Transfers Overall transfer level: Needs assistance Equipment used: Rolling walker (2 wheeled) Transfers: Sit to/from Stand Sit to Stand: Mod assist;Max assist;+2 physical assistance;+2 safety/equipment;From elevated surface         General transfer comment: needed assistance to rise, stabilize, control descent, move RLE out in front to sit  Ambulation/Gait              General Gait Details: NT - pt c/o increasing dizziness and becoming diaphoretic while standing and returned to bed  Stairs            Wheelchair Mobility    Modified Rankin (Stroke Patients Only)       Balance                                             Pertinent Vitals/Pain Pain Assessment: 0-10 Pain Score: 8  Pain Location: R knee Pain Descriptors / Indicators: Aching;Sore Pain Intervention(s): Limited activity within patient's tolerance;Monitored during session;Premedicated before session;Ice applied    Home Living Family/patient expects to be discharged to:: Private residence Living Arrangements: Spouse/significant other Available Help at Discharge: Family Type of Home: House Home Access: Stairs to enter Entrance Stairs-Rails: Right Entrance Stairs-Number of Steps: 4 Home Layout: Two level;Able to live on main level with bedroom/bathroom Home Equipment: [name] - single point      Prior Function Level of Independence: Independent               Hand Dominance        Extremity/Trunk Assessment   Upper Extremity Assessment: Generalized weakness           Lower Extremity Assessment: Generalized weakness;RLE deficits/detail      Cervical / Trunk Assessment: Kyphotic  Communication   Communication: No difficulties  Cognition Arousal/Alertness: Awake/alert Behavior During Therapy: WFL for tasks assessed/performed Overall Cognitive Status: Within Functional Limits for tasks assessed  General Comments      Exercises        Assessment/Plan    PT Assessment Patient needs continued PT services  PT Diagnosis Difficulty walking   PT Problem List Decreased strength;Decreased range of motion;Decreased activity tolerance;Decreased mobility;Decreased knowledge of use of DME;Pain  PT Treatment Interventions DME instruction;Gait training;Stair training;Functional mobility  training;Therapeutic activities;Therapeutic exercise;Patient/family education   PT Goals (Current goals can be found in the Care Plan section) Acute Rehab PT Goals Patient Stated Goal: Regain IND PT Goal Formulation: With patient Time For Goal Achievement: 06/01/16 Potential to Achieve Goals: Fair    Frequency 7X/week   Barriers to discharge        Co-evaluation PT/OT/SLP Co-Evaluation/Treatment: Yes Reason for Co-Treatment: For patient/therapist safety PT goals addressed during session: Mobility/safety with mobility OT goals addressed during session: ADL's and self-care       End of Session Equipment Utilized During Treatment: Gait belt;Right knee immobilizer Activity Tolerance: Patient limited by pain;Patient limited by fatigue;Other (comment) (dizziness) Patient left: in bed;with call bell/phone within reach;with family/visitor present Nurse Communication: Mobility status         Time: [id number] PT Time Calculation (min) (ACUTE ONLY): 32 min   Charges:   PT Evaluation $PT Eval Moderate Complexity: 1 Procedure     PT G Codes:        Joshua Strong 2016-05-26, 4:35 PM

## 2016-05-25 NOTE — Discharge Instructions (Signed)

## 2016-05-25 NOTE — Progress Notes (Signed)
Subjective: 1 Day Post-Op Procedure(s) (LRB): RIGHT TOTAL KNEE ARTHROPLASTY (Right) Patient reports pain as moderate.    Objective: Vital signs in last 24 hours: Temp:  [97.9 F (36.6 C)-99.5 F (37.5 C)] 99.5 F (37.5 C) (08/26 0920) Pulse Rate:  [60-85] 66 (08/26 0920) Resp:  [12-20] 15 (08/26 0920) BP: (115-192)/(53-96) 115/56 (08/26 0920) SpO2:  [95 %-100 %] 100 % (08/26 0920) Weight:  [75.3 kg (166 lb)] 75.3 kg (166 lb) (08/25 1629)  Intake/Output from previous day: 08/25 0701 - 08/26 0700 In: 2435 [I.V.:2325; IV Piggyback:110] Out: 525 [Urine:475; Blood:50] Intake/Output this shift: Total I/O In: 240 [P.O.:240] Out: -    Recent Labs  05/25/16 0415  HGB 13.0    Recent Labs  05/25/16 0415  WBC 11.2*  RBC 4.28  HCT 39.2  PLT 184    Recent Labs  05/25/16 0415  NA 138  K 4.7  CL 103  CO2 31  BUN 24*  CREATININE 1.54*  GLUCOSE 116*  CALCIUM 9.0   No results for input(s): LABPT, INR in the last 72 hours.  Sensation intact distally Intact pulses distally Dorsiflexion/Plantar flexion intact Incision: no drainage No cellulitis present Compartment soft  Assessment/Plan: 1 Day Post-Op Procedure(s) (LRB): RIGHT TOTAL KNEE ARTHROPLASTY (Right) Up with therapy  Joshua Strong 05/25/2016, 9:45 AM

## 2016-05-26 MED ORDER — CHLORPROMAZINE HCL 50 MG PO TABS
50.0000 mg | ORAL_TABLET | Freq: Once | ORAL | Status: AC
  Administered 2016-05-26: 50 mg via ORAL
  Filled 2016-05-26: qty 1

## 2016-05-26 NOTE — Clinical Social Work Note (Signed)
Clinical Social Work Assessment  Patient Details  Name: Joshua Strong MRN: [id number] Date of Birth: [birth year]  Date of referral:  05/26/16               Reason for consult:  Facility Placement                Permission sought to share information with:  Family Supports Permission granted to share information::  Yes, Verbal Permission Granted  Name::     Jari Favre  Agency::     Relationship::  wife, daughter, son-in-law  Contact Information:     Housing/Transportation Living arrangements for the past 2 months:  Single Family Home Source of Information:  Patient, Adult Children, Spouse Patient Interpreter Needed:  None Criminal Activity/Legal Involvement Pertinent to Current Situation/Hospitalization:  No - Comment as needed Significant Relationships:  Spouse, Adult Children Lives with:  Spouse Do you feel safe going back to the place where you live?  Yes Need for family participation in patient care:  No (Coment)  Care giving concerns:  Pt lives with wife, but would need to only require limited assist in order to return home.    Social Worker assessment / plan:  CSW met with pt, pt's wife, daughter, and son-in-law at bedside with pt's permission. Pt alert and oriented and reports he is 2 days post op right total knee. Pt is a retired Theme park manager. He has a good sense of humor and feels this will help his recovery. CSW discussed PT recommendations for SNF as well as Medicare coverage/criteria. He states he likes to follow recommendations generally, but would like to see how he does tomorrow with PT. After discussion, he is agreeable to sending information to [facility] and [facility] for review in case he decides on SNF. CSW noted after assessment that pt is progressing and recommendation is now SNF vs home health. Will ask weekday CSW to follow up.   Employment status:  Retired Forensic scientist:  Information systems manager PT Recommendations:  [place], Home with [place] / Referral to community resources:  [place]  Patient/Family's Response to care:  Pt reluctant to make decision at this time, but agreeable to send referral to [place] and [facility]. Will make final decision tomorrow on SNF vs home with home health. Family appears to be more agreeable to SNF.   Patient/Family's Understanding of and Emotional Response to Diagnosis, Current Treatment, and Prognosis:  Pt aware of recommendations and will continue to think about decision overnight regarding d/c plan.   Emotional Assessment Appearance:  Appears younger than stated age Attitude/Demeanor/Rapport:  Other (Cooperative) Affect (typically observed):  Appropriate Orientation:  Oriented to Self, Oriented to Place, Oriented to  Time, Oriented to Situation Alcohol / Substance use:  Not Applicable Psych involvement (Current and /or in the community):  No (Comment)  Discharge Needs  Concerns to be addressed:  Discharge Planning Concerns Readmission within the last 30 days:  No Current discharge risk:  None Barriers to Discharge:  No Barriers Identified   Salome Arnt, [place] 05/26/2016, 4:03 PM [phone]

## 2016-05-26 NOTE — Clinical Social Work Placement (Signed)
   CLINICAL SOCIAL WORK PLACEMENT  NOTE  Date:  05/26/2016  Patient Details  Name: Joshua Strong MRN: [id number] Date of Birth: [birth year]  Clinical Social Work is seeking post-discharge placement for this patient at the [place] level of care (*CSW will initial, date and re-position this form in  chart as items are completed):  Yes   Patient/family provided with [place] Work Department's list of facilities offering this level of care within the geographic area requested by the patient (or if unable, by the patient's family).  Yes   Patient/family informed of their freedom to choose among providers that offer the needed level of care, that participate in Medicare, Medicaid or managed care program needed by the patient, have an available bed and are willing to accept the patient.  Yes   Patient/family informed of [facility]'s ownership interest in [facility] and [facility], as well as of the fact that they are under no obligation to receive care at these facilities.  PASRR submitted to EDS on 05/26/16     PASRR number received on 05/26/16     Existing PASRR number confirmed on       FL2 transmitted to all facilities in geographic area requested by pt/family on 05/26/16     FL2 transmitted to all facilities within larger geographic area on       Patient informed that his/her managed care company has contracts with or will negotiate with certain facilities, including the following:            Patient/family informed of bed offers received.  Patient chooses bed at       Physician recommends and patient chooses bed at      Patient to be transferred to   on  .  Patient to be transferred to facility by       Patient family notified on   of transfer.  Name of family member notified:        PHYSICIAN       Additional Comment:    _______________________________________________ Salome Arnt, LCSW 05/26/2016, 3:52  PM [phone]

## 2016-05-26 NOTE — Progress Notes (Signed)
Pt complaining of intractable hiccups. Hiccups nonstop for two days.  On call ortho MD paged.

## 2016-05-26 NOTE — Progress Notes (Signed)
Physical Therapy Treatment Patient Details Name: Joshua Strong MRN: [id number] DOB: [birth year] Today's Date: 05/26/2016    History of Present Illness s/p RIGHT TOTAL KNEE ARTHROPLASTY (Right)    PT Comments    Progressing; SNF has been recommended, pt does continue to progress slowly but will see how he does tomorrow; much better today compared to Friday's therapy sessions, may be able to D/C wiHHPT depeding on progress and wife's ability to assist  Follow Up Recommendations  SNF;Home health PT (vs--depending on progress)     Equipment Recommendations  Rolling walker with 5" wheels    Recommendations for Other Services       Precautions / Restrictions Precautions Precautions: Fall;Knee Precaution Comments: reports falling at home multiple times PTA; IND SLR today Restrictions Other Position/Activity Restrictions: WBAT    Mobility  Bed Mobility Overal bed mobility: Needs Assistance Bed Mobility: Sit to Supine       Sit to supine: Min assist   General bed mobility comments: cues for sequence and use of L LE to self assist.    Transfers Overall transfer level: Needs assistance Equipment used: Rolling walker (2 wheeled) Transfers: Sit to/from Stand Sit to Stand: Min assist         General transfer comment: needed assistance to rise, stabilize, control descent, move RLE out in front to sit  Ambulation/Gait Ambulation/Gait assistance: Min assist Ambulation Distance (Feet): 40 Feet Assistive device: Rolling walker (2 wheeled) Gait Pattern/deviations: Step-to pattern;Antalgic;Trunk flexed Gait velocity: very slow   General Gait Details: cues for sequence and safety, assist to steady during wt shift   Stairs            Wheelchair Mobility    Modified Rankin (Stroke Patients Only)       Balance                                    Cognition Arousal/Alertness: Awake/alert Behavior During Therapy: WFL for tasks assessed/performed Overall  Cognitive Status: Within Functional Limits for tasks assessed                      Exercises Total Joint Exercises Ankle Circles/Pumps: AROM;Both;15 reps;Supine Quad Sets: AROM;Both;10 reps;Supine Short Arc Quad: AROM;AAROM;Right;10 reps Heel Slides: AAROM;Right;Supine;10 reps Hip ABduction/ADduction: AAROM;Right;10 reps Straight Leg Raises: AAROM;Right;10 reps;Supine    General Comments        Pertinent Vitals/Pain Pain Assessment: 0-10 Pain Score: 6  Pain Location: R knee with exercises Pain Descriptors / Indicators: Sore Pain Intervention(s): Limited activity within patient's tolerance;Monitored during session;Premedicated before session;Repositioned    Home Living                      Prior Function            PT Goals (current goals can now be found in the care plan section) Acute Rehab PT Goals Patient Stated Goal: Regain IND PT Goal Formulation: With patient Time For Goal Achievement: 06/01/16 Potential to Achieve Goals: Fair Progress towards PT goals: Progressing toward goals    Frequency  7X/week    PT Plan Current plan remains appropriate    Co-evaluation             End of Session Equipment Utilized During Treatment: Gait belt Activity Tolerance: Patient tolerated treatment well Patient left: in bed;with call bell/phone within reach;with bed alarm set;with family/visitor present     Time: [id number]  PT Time Calculation (min) (ACUTE ONLY): 26 min  Charges:  $Gait Training: 8-22 mins $Therapeutic Exercise: 8-22 mins                    G Codes:      Albaro Deviney May 28, 2016, 3:42 PM

## 2016-05-26 NOTE — Progress Notes (Signed)
Physical Therapy Treatment Patient Details Name: Joshua Strong MRN: [id number] DOB: [birth year] Today's Date: 05/28/16    History of Present Illness s/p RIGHT TOTAL KNEE ARTHROPLASTY (Right)    PT Comments    Progressing, although slowly  Follow Up Recommendations  SNF     Equipment Recommendations  Rolling walker with 5" wheels    Recommendations for Other Services       Precautions / Restrictions Precautions Precautions: Fall;Knee Precaution Comments: reports falling at home multiple times PTA; IND SLR today Required Braces or Orthoses: Knee Immobilizer - Right Restrictions Weight Bearing Restrictions: No Other Position/Activity Restrictions: WBAT    Mobility  Bed Mobility Overal bed mobility: Needs Assistance;+2 for physical assistance;+ 2 for safety/equipment Bed Mobility: Supine to Sit     Supine to sit: Min assist     General bed mobility comments: cues for sequence and use of L LE to self assist.    Transfers Overall transfer level: Needs assistance Equipment used: Rolling walker (2 wheeled) Transfers: Sit to/from Stand Sit to Stand: Min assist;Mod assist         General transfer comment: needed assistance to rise, stabilize, control descent, move RLE out in front to sit  Ambulation/Gait Ambulation/Gait assistance: Min assist Ambulation Distance (Feet): 24 Feet Assistive device: Rolling walker (2 wheeled) Gait Pattern/deviations: Step-to pattern;Trunk flexed Gait velocity: very slow   General Gait Details: cues for sequence and safety   Stairs            Wheelchair Mobility    Modified Rankin (Stroke Patients Only)       Balance                                    Cognition Arousal/Alertness: Awake/alert Behavior During Therapy: WFL for tasks assessed/performed Overall Cognitive Status: Within Functional Limits for tasks assessed                      Exercises      General Comments         Pertinent Vitals/Pain Pain Assessment: 0-10 Pain Score: 4  Pain Location: R knee Pain Descriptors / Indicators: Grimacing;Guarding;Aching Pain Intervention(s): Limited activity within patient's tolerance;Monitored during session;Premedicated before session;Repositioned;Ice applied    Home Living                      Prior Function            PT Goals (current goals can now be found in the care plan section) Acute Rehab PT Goals Patient Stated Goal: Regain IND PT Goal Formulation: With patient Time For Goal Achievement: 06/01/16 Potential to Achieve Goals: Fair Progress towards PT goals: Progressing toward goals    Frequency  7X/week    PT Plan Current plan remains appropriate    Co-evaluation             End of Session Equipment Utilized During Treatment: Gait belt Activity Tolerance: Patient tolerated treatment well Patient left: in chair;with call bell/phone within reach;with chair alarm set     Time: [id number] PT Time Calculation (min) (ACUTE ONLY): 13 min  Charges:  $Gait Training: 8-22 mins                    G Codes:      Tilia Faso May 28, 2016, 11:03 AM

## 2016-05-26 NOTE — NC FL2 (Signed)
[place] MEDICAID FL2 LEVEL OF CARE SCREENING TOOL     IDENTIFICATION  Patient Name: Joshua Strong Birthdate: October 05, 1930 Sex: male Admission Date (Current Location): 05/24/2016  [facility] and [place] Number:  Engineer, manufacturing systems and Address:  [facility],  [address] [place], [place]      Provider Number: [id number]  Attending Physician Name and Address:  Mcarthur Rossetti, *  Relative Name and Phone Number:       Current Level of Care: Hospital Recommended Level of Care: [place] Prior Approval Number:    Date Approved/Denied:   PASRR Number: [id number]  Discharge Plan: SNF    Current Diagnoses: Patient Active Problem List   Diagnosis Date Noted  . Osteoarthritis of right knee 05/24/2016  . Status post total right knee replacement 05/24/2016  . Diverticulosis of colon without hemorrhage   . CONSTIPATION 04/19/2010  . ABDOMINAL PAIN, LEFT LOWER QUADRANT 09/18/2009  . EPIGASTRIC PAIN 09/18/2009  . CARCINOMA, RENAL CELL 08/08/2008  . PNEUMONIA 08/08/2008  . ACUTE PANCREATITIS 08/08/2008  . NAUSEA 08/08/2008  . ABDOMINAL PAIN, CHRONIC 08/08/2008    Orientation RESPIRATION BLADDER Height & Weight     Self, Time, Situation, Place  O2 (1 L) Continent Weight: 166 lb (75.3 kg) Height:  5\' 6"  (167.6 cm)  BEHAVIORAL SYMPTOMS/MOOD NEUROLOGICAL BOWEL NUTRITION STATUS  Other (Comment) (n/a)  (n/a) Continent Diet (Regular)  AMBULATORY STATUS COMMUNICATION OF NEEDS Skin   Limited Assist Verbally Surgical wounds                       Personal Care Assistance Level of Assistance  Bathing, Feeding, Dressing Bathing Assistance: Limited assistance Feeding assistance: Limited assistance Dressing Assistance: Limited assistance     Functional Limitations Info  Sight, Hearing, Speech Sight Info: Impaired Hearing Info: Impaired Speech Info: Adequate    SPECIAL CARE FACTORS FREQUENCY  OT (By licensed OT), PT (By licensed PT)                     Contractures Contractures Info: Not present    Additional Factors Info  Code Status, Allergies Code Status Info: Full code Allergies Info: Morphine and Related, Metoclopramide Hcl           Current Medications (05/26/2016):  This is the current hospital active medication list Current Facility-Administered Medications  Medication Dose Route Frequency Provider Last Rate Last Dose  . 0.9 %  sodium chloride infusion   Intravenous Continuous Mcarthur Rossetti, MD 20 mL/hr at 05/25/16 1837    . acetaminophen (TYLENOL) tablet 650 mg  650 mg Oral Q6H PRN Mcarthur Rossetti, MD   650 mg at 05/25/16 2242   Or  . acetaminophen (TYLENOL) suppository 650 mg  650 mg Rectal Q6H PRN Mcarthur Rossetti, MD      . alum & mag hydroxide-simeth (MAALOX/MYLANTA) 200-200-20 MG/5ML suspension 30 mL  30 mL Oral Q4H PRN Mcarthur Rossetti, MD   30 mL at 05/26/16 1246  . aspirin EC tablet 325 mg  325 mg Oral BID PC Mcarthur Rossetti, MD   325 mg at 05/26/16 [id number]  . calcium-vitamin D (OSCAL WITH D) 500-200 MG-UNIT per tablet 1 tablet  1 tablet Oral Q breakfast Mcarthur Rossetti, MD   1 tablet at 05/26/16 [phone]  . diphenhydrAMINE (BENADRYL) 12.5 MG/5ML elixir 12.5-25 mg  12.5-25 mg Oral Q4H PRN Mcarthur Rossetti, MD      . docusate sodium (COLACE) capsule  100 mg  100 mg Oral BID Mcarthur Rossetti, MD   100 mg at 05/26/16 [id number]  . HYDROmorphone (DILAUDID) injection 0.5 mg  0.5 mg Intravenous Q2H PRN Mcarthur Rossetti, MD   0.5 mg at 05/26/16 1123  . menthol-cetylpyridinium (CEPACOL) lozenge 3 mg  1 lozenge Oral PRN Mcarthur Rossetti, MD       Or  . phenol (CHLORASEPTIC) mouth spray 1 spray  1 spray Mouth/Throat PRN Mcarthur Rossetti, MD      . methocarbamol (ROBAXIN) tablet 500 mg  500 mg Oral Q6H PRN Mcarthur Rossetti, MD   500 mg at 05/26/16 1434   Or  . methocarbamol (ROBAXIN) 500 mg in dextrose 5 % 50 mL IVPB  500 mg Intravenous Q6H  PRN Mcarthur Rossetti, MD   500 mg at 05/24/16 1500  . metoCLOPramide (REGLAN) tablet 5-10 mg  5-10 mg Oral Q8H PRN Mcarthur Rossetti, MD       Or  . metoCLOPramide (REGLAN) injection 5-10 mg  5-10 mg Intravenous Q8H PRN Mcarthur Rossetti, MD      . ondansetron [facility]) tablet 4 mg  4 mg Oral Q6H PRN Mcarthur Rossetti, MD       Or  . ondansetron [facility]) injection 4 mg  4 mg Intravenous Q6H PRN Mcarthur Rossetti, MD   4 mg at 05/26/16 1239  . oxyCODONE (Oxy IR/ROXICODONE) immediate release tablet 5-10 mg  5-10 mg Oral Q3H PRN Mcarthur Rossetti, MD   10 mg at 05/26/16 1432  . polyethylene glycol (MIRALAX / GLYCOLAX) packet 17 g  17 g Oral Daily PRN Mcarthur Rossetti, MD         Discharge Medications: Please see discharge summary for a list of discharge medications.  Relevant Imaging Results:  Relevant Lab Results:   Additional Information WBAT. SSN: [id number]  Salome Arnt, [place]

## 2016-05-26 NOTE — Progress Notes (Signed)
   Subjective:  Patient reports pain as mild.  No events.  Objective:   VITALS:   Vitals:   05/25/16 1416 05/25/16 2234 05/26/16 0040 05/26/16 0606  BP: (!) 119/52 (!) 134/53  (!) 116/43  Pulse: 66 88  71  Resp: 16 19  20   Temp: 99.6 F (37.6 C) (!) 101.3 F (38.5 C) 99.9 F (37.7 C) 99 F (37.2 C)  TempSrc: Oral Oral Oral Oral  SpO2: 100% 93%  95%  Weight:      Height:        Neurologically intact Neurovascular intact Sensation intact distally Intact pulses distally Dorsiflexion/Plantar flexion intact Incision: dressing C/D/I and no drainage No cellulitis present Compartment soft   Lab Results  Component Value Date   WBC 11.2 (H) 05/25/2016   HGB 13.0 05/25/2016   HCT 39.2 05/25/2016   MCV 91.6 05/25/2016   PLT 184 05/25/2016     Assessment/Plan:  2 Days Post-Op   - Expected postop acute blood loss anemia - will monitor for symptoms - Up with PT/OT - will need more PT if patient wants to go home, currently recommending SNF - DVT ppx - SCDs, ambulation, aspirin - WBAT operative extremity - Pain control   Marianna Payment 05/26/2016, 7:05 AM [phone]

## 2016-05-27 DIAGNOSIS — Z96651 Presence of right artificial knee joint: Secondary | ICD-10-CM | POA: Diagnosis not present

## 2016-05-27 DIAGNOSIS — R531 Weakness: Secondary | ICD-10-CM | POA: Diagnosis not present

## 2016-05-27 DIAGNOSIS — R2689 Other abnormalities of gait and mobility: Secondary | ICD-10-CM | POA: Diagnosis not present

## 2016-05-27 DIAGNOSIS — M6281 Muscle weakness (generalized): Secondary | ICD-10-CM | POA: Diagnosis not present

## 2016-05-27 DIAGNOSIS — M1711 Unilateral primary osteoarthritis, right knee: Secondary | ICD-10-CM | POA: Diagnosis not present

## 2016-05-27 DIAGNOSIS — K219 Gastro-esophageal reflux disease without esophagitis: Secondary | ICD-10-CM | POA: Diagnosis not present

## 2016-05-27 DIAGNOSIS — Z471 Aftercare following joint replacement surgery: Secondary | ICD-10-CM | POA: Diagnosis not present

## 2016-05-27 DIAGNOSIS — M25569 Pain in unspecified knee: Secondary | ICD-10-CM | POA: Diagnosis not present

## 2016-05-27 DIAGNOSIS — M62838 Other muscle spasm: Secondary | ICD-10-CM | POA: Diagnosis not present

## 2016-05-27 DIAGNOSIS — K5900 Constipation, unspecified: Secondary | ICD-10-CM | POA: Diagnosis not present

## 2016-05-27 DIAGNOSIS — M25561 Pain in right knee: Secondary | ICD-10-CM | POA: Diagnosis not present

## 2016-05-27 NOTE — Addendum Note (Signed)
Addendum  created 05/27/16 [id number] by Lollie Sails, CRNA   Charge Capture section accepted

## 2016-05-27 NOTE — Clinical Social Work Placement (Signed)
Patient is set to discharge to [facility] today. Patient & family at bedside aware. Discharge packet given to RN, Judson Roch. Family planning to transport to SNF, if unable - PTAR transport form has been completed & in discharge packet, RN to call for transport [phone].     Raynaldo Opitz, [facility] Clinical Social Worker cell #: [phone]    CLINICAL SOCIAL WORK PLACEMENT  NOTE  Date:  05/27/2016  Patient Details  Name: Joshua Strong MRN: [id number] Date of Birth: [birth year]  Clinical Social Work is seeking post-discharge placement for this patient at the [place] level of care (*CSW will initial, date and re-position this form in  chart as items are completed):  Yes   Patient/family provided with [place] Work Department's list of facilities offering this level of care within the geographic area requested by the patient (or if unable, by the patient's family).  Yes   Patient/family informed of their freedom to choose among providers that offer the needed level of care, that participate in Medicare, Medicaid or managed care program needed by the patient, have an available bed and are willing to accept the patient.  Yes   Patient/family informed of [place]'s ownership interest in [facility] and [facility], as well as of the fact that they are under no obligation to receive care at these facilities.  PASRR submitted to EDS on 05/26/16     PASRR number received on 05/26/16     Existing PASRR number confirmed on       FL2 transmitted to all facilities in geographic area requested by pt/family on 05/26/16     FL2 transmitted to all facilities within larger geographic area on       Patient informed that his/her managed care company has contracts with or will negotiate with certain facilities, including the following:        Yes   Patient/family informed of bed offers received.  Patient chooses bed at  [facility]     Physician recommends and patient chooses bed at      Patient to be transferred to [facility] on 05/27/16.  Patient to be transferred to facility by family to transport if able, otherwise PTAR     Patient family notified on 05/27/16 of transfer.  Name of family member notified:  patient's family at bedside     PHYSICIAN       Additional Comment:    _______________________________________________ Standley Brooking, LCSW 05/27/2016, 10:50 AM

## 2016-05-27 NOTE — Progress Notes (Signed)
Patient very sleepy at this time. RN communicated patient sleepiness to MD.   MD has cleared patient for discharge.   RN will communicate patient status to receiving facility.

## 2016-05-27 NOTE — Progress Notes (Signed)
Patient ID: Joshua Strong, male   DOB: 09/11/1931, 82 y.o.   MRN: [id number] Resting comfortably.  Right knee stable.  Therapy is recommending short-term skilled nursing.  Can go there this afternoon pending bed availability.

## 2016-05-27 NOTE — Progress Notes (Signed)
Occupational Therapy Treatment Patient Details Name: Joshua Strong MRN: [id number] DOB: 07/06/1931 Today's Date: 05/27/2016    History of present illness s/p RIGHT TOTAL KNEE ARTHROPLASTY (Right)   OT comments  Progressing very slowly towards OT goals and today was limited by dizziness and pain. Recommend SNF and pt/wife in agreement. Continue OT per plan of care.   Follow Up Recommendations  SNF    Equipment Recommendations  Other (comment) (tbd next venue of care)    Recommendations for Other Services      Precautions / Restrictions Precautions Precautions: Fall;Knee Precaution Comments: reports falling at home multiple times PTA; IND SLR today Required Braces or Orthoses: Knee Immobilizer - Right Knee Immobilizer - Right: Discontinue once straight leg raise with < 10 degree lag Restrictions Weight Bearing Restrictions: No Other Position/Activity Restrictions: WBAT       Mobility Bed Mobility Overal bed mobility: Needs Assistance Bed Mobility: Sit to Supine     Supine to sit: HOB elevated;Min assist Sit to supine: +2 for physical assistance;+2 for safety/equipment;Mod assist   General bed mobility comments: cues for sequence and use of L LE to self assist; requires excessive amount of time to complete task; assist with   Transfers Overall transfer level: Needs assistance Equipment used: Rolling walker (2 wheeled) Transfers: Sit to/from Stand Sit to Stand: Mod assist;+2 safety/equipment         General transfer comment: repeated x2 d/t dizziness; needed assistance to rise, stabilize, control descent, move RLE forward     Balance                                   ADL Overall ADL's : Needs assistance/impaired Eating/Feeding: Maximal assistance;Bed level Eating/Feeding Details (indicate cue type and reason): drink from cup                     Toilet Transfer: Moderate assistance;+2 for physical assistance;+2 for safety/equipment;Cueing  for safety;BSC;RW Toilet Transfer Details (indicate cue type and reason): simulated with sit to stand and sidesteps toward [facility]         Functional mobility during ADLs: Moderate assistance;+2 for physical assistance;+2 for safety/equipment;Cueing for safety;Rolling walker General ADL Comments: PT summoned OT to room to assist with treatment session. Patient had just stood with PT and had gotten dizzy. BP taken in sitting 118/59. After rest break, sit to stand again, BP taken 109/52 and pt stood and was able to take a couple of side steps toward [facility]. He reported he could not tolerate further activity and wanted to get back to bed. Assisted back to bed.      Vision                     Perception     Praxis      Cognition   Behavior During Therapy: [facility] for tasks assessed/performed;Flat affect Overall Cognitive Status: Impaired/Different from baseline Area of Impairment: Attention     Memory: Decreased short-term memory  Following Commands: Follows one step commands with increased time     Problem Solving: Slow processing;Decreased initiation;Requires verbal cues;Requires tactile cues General Comments: pt with overall decr mentation today compared to previous day    Extremity/Trunk Assessment               Exercises    Shoulder Instructions       General Comments      Pertinent Vitals/ Pain  Pain Assessment: 0-10 Pain Score: 7  Pain Location: R knee Pain Descriptors / Indicators: Sore Pain Intervention(s): Limited activity within patient's tolerance;Monitored during session;Ice applied  Home Living                                          Prior Functioning/Environment              Frequency Min 2X/week     Progress Toward Goals  OT Goals(current goals can now be found in the care plan section)  Progress towards OT goals: Not progressing toward goals - comment (limited by dizziness and pain)  Acute Rehab OT  Goals Patient Stated Goal: [place] Discharge plan remains appropriate    Co-evaluation    PT/OT/SLP Co-Evaluation/Treatment: Yes Reason for Co-Treatment: For patient/therapist safety PT goals addressed during session: Mobility/safety with mobility OT goals addressed during session: ADL's and self-care      End of Session Equipment Utilized During Treatment: Rolling walker    Activity Tolerance Patient tolerated treatment well   Patient Left in bed;with call bell/phone within reach;with family/visitor present   Nurse Communication Mobility status        Time: 1127-1140 OT Time Calculation (min): 13 min  Charges: OT General Charges $OT Visit: 1 Procedure OT Treatments $Therapeutic Activity: 8-22 mins  Cassondra Stachowski A 05/27/2016, 12:58 PM

## 2016-05-27 NOTE — Progress Notes (Signed)
Physical Therapy Treatment Patient Details Name: Joshua Strong MRN: [id number] DOB: 1931/01/27 Today's Date: 05/27/2016    History of Present Illness s/p RIGHT TOTAL KNEE ARTHROPLASTY (Right)    PT Comments    Pt certainly will need SNF post acute; he is requiring incr assist today; dizzy with standing although not a significant drop in BP; 118/59 in sitting; 109/52 in standing;  Follow Up Recommendations  Supervision/Assistance - 24 hour;SNF     Equipment Recommendations  Rolling walker with 5" wheels    Recommendations for Other Services       Precautions / Restrictions Precautions Precautions: Fall;Knee Precaution Comments: reports falling at home multiple times PTA; IND SLR today Required Braces or Orthoses: Knee Immobilizer - Right Knee Immobilizer - Right: Discontinue once straight leg raise with < 10 degree lag Restrictions Weight Bearing Restrictions: No Other Position/Activity Restrictions: WBAT    Mobility  Bed Mobility Overal bed mobility: Needs Assistance Bed Mobility: Supine to Sit;Sit to Supine     Supine to sit: HOB elevated;Min assist Sit to supine: +2 for physical assistance;+2 for safety/equipment;Mod assist   General bed mobility comments: cues for sequence and use of L LE to self assist; requires excessive amount of time to complete task; assist with   Transfers Overall transfer level: Needs assistance Equipment used: Rolling walker (2 wheeled) Transfers: Sit to/from Stand Sit to Stand: Mod assist;+2 safety/equipment         General transfer comment: repeated x2 d/t dizziness; needed assistance to rise, stabilize, control descent, move RLE forward   Ambulation/Gait             General Gait Details: unable d/t dizziness   Stairs            Wheelchair Mobility    Modified Rankin (Stroke Patients Only)       Balance                                    Cognition Arousal/Alertness: Awake/alert (very  sleepy) Behavior During Therapy: WFL for tasks assessed/performed;Flat affect Overall Cognitive Status: Impaired/Different from baseline Area of Impairment: Attention     Memory: Decreased short-term memory Following Commands: Follows one step commands with increased time     Problem Solving: Slow processing;Decreased initiation;Requires verbal cues;Requires tactile cues General Comments: pt with overall decr mentation today compared to previous day    Exercises Total Joint Exercises Ankle Circles/Pumps: AROM;Both;15 reps;Supine Quad Sets: AROM;Both;10 reps;Supine    General Comments        Pertinent Vitals/Pain Pain Assessment: 0-10 Pain Score: 7  Pain Location: R knee  Pain Descriptors / Indicators: Sore Pain Intervention(s): Limited activity within patient's tolerance;Monitored during session;Ice applied    Home Living                      Prior Function            PT Goals (current goals can now be found in the care plan section) Acute Rehab PT Goals Patient Stated Goal: Regain IND PT Goal Formulation: With patient Time For Goal Achievement: 06/01/16 Potential to Achieve Goals: Fair Progress towards PT goals: Progressing toward goals    Frequency  7X/week    PT Plan Current plan remains appropriate    Co-evaluation PT/OT/SLP Co-Evaluation/Treatment: Yes Reason for Co-Treatment: For patient/therapist safety PT goals addressed during session: Mobility/safety with mobility;Proper use of DME (partial co-tx)  End of Session Equipment Utilized During Treatment: Gait belt Activity Tolerance: Patient tolerated treatment well Patient left: in bed;with call bell/phone within reach;with bed alarm set;with family/visitor present     Time: [id number] PT Time Calculation (min) (ACUTE ONLY): 24 min  Charges:  $Therapeutic Activity: 8-22 mins                    G Codes:      Cathye Kreiter 06/06/2016, 12:44 PM

## 2016-05-27 NOTE — Progress Notes (Signed)
RN reviewed discharge instructions with family.   Patient sent to SNF via PTAR.   RN called report to [place] at [facility]. All questions answered.   Paperwork and prescriptions sent with patient and PTAR to SNF.

## 2016-05-27 NOTE — Discharge Summary (Signed)
Patient ID: Joshua Strong MRN: [id number] DOB/AGE: 06-02-1931 82 y.o.  Admit date: 05/24/2016 Discharge date: 05/27/2016  Admission Diagnoses:  Principal Problem:   Osteoarthritis of right knee Active Problems:   Status post total right knee replacement   Discharge Diagnoses:  Same  Past Medical History:  Diagnosis Date  . GERD (gastroesophageal reflux disease)   . GI bleed    ulcer  . Hemorrhoid   . Pancreatitis   . Pneumonia   . Renal cell carcinoma ([place])   . Tubular adenoma   . Tubular adenoma 2013    Surgeries: Procedure(s): RIGHT TOTAL KNEE ARTHROPLASTY on 05/24/2016   Consultants:   Discharged Condition: Improved  Hospital Course: Joshua Strong is an 82 y.o. male who was admitted 05/24/2016 for operative treatment ofOsteoarthritis of right knee. Patient has severe unremitting pain that affects sleep, daily activities, and work/hobbies. After pre-op clearance the patient was taken to the operating room on 05/24/2016 and underwent  Procedure(s): RIGHT TOTAL KNEE ARTHROPLASTY.    Patient was given perioperative antibiotics: Anti-infectives    Start     Dose/Rate Route Frequency Ordered Stop   05/24/16 1800  ceFAZolin (ANCEF) IVPB 1 g/50 mL premix     1 g 100 mL/hr over 30 Minutes Intravenous Every 6 hours 05/24/16 1638 05/25/16 0559   05/24/16 1100  ceFAZolin (ANCEF) IVPB 2g/100 mL premix     2 g 200 mL/hr over 30 Minutes Intravenous On call to O.R. 05/24/16 1016 05/24/16 1207       Patient was given sequential compression devices, early ambulation, and chemoprophylaxis to prevent DVT.  Patient benefited maximally from hospital stay and there were no complications.    Recent vital signs: Patient Vitals for the past 24 hrs:  BP Temp Temp src Pulse Resp SpO2  05/27/16 0642 (!) 99/49 98 F (36.7 C) Axillary 92 16 94 %  05/26/16 2212 (!) 123/59 (!) 100.7 F (38.2 C) Oral (!) 104 20 96 %  05/26/16 1525 - - - - - 100 %  05/26/16 1501 (!) 146/64 98.7 F (37.1 C)  Oral 78 18 91 %     Recent laboratory studies:  Recent Labs  05/25/16 0415  WBC 11.2*  HGB 13.0  HCT 39.2  PLT 184  NA 138  K 4.7  CL 103  CO2 31  BUN 24*  CREATININE 1.54*  GLUCOSE 116*  CALCIUM 9.0     Discharge Medications:     Medication List    TAKE these medications   acetaminophen 500 MG tablet Commonly known as:  TYLENOL Take 500 mg by mouth every 6 (six) hours as needed for mild pain.   aspirin 325 MG EC tablet Take 1 tablet (325 mg total) by mouth 2 (two) times daily after a meal.   calcium citrate-vitamin D 315-200 MG-UNIT tablet Commonly known as:  CITRACAL+D Take 1 tablet by mouth daily.   FORTEO 600 MCG/2.4ML Soln Generic drug:  Teriparatide (Recombinant) Inject 20 mcg into the skin daily.   methocarbamol 500 MG tablet Commonly known as:  ROBAXIN Take 1 tablet (500 mg total) by mouth every 6 (six) hours as needed for muscle spasms.   oxyCODONE-acetaminophen 5-325 MG tablet Commonly known as:  ROXICET Take 1-2 tablets by mouth every 4 (four) hours as needed.   promethazine 25 MG tablet Commonly known as:  PHENERGAN Take 25 mg by mouth every 8 (eight) hours as needed for nausea.       Diagnostic Studies: Dg Knee Right Port  Result Date: 05/24/2016 CLINICAL DATA:  Status post right total knee arthroplasty EXAM: PORTABLE RIGHT KNEE - 1-2 VIEW COMPARISON:  None. FINDINGS: Postoperative appearance of the right knee compatible with total arthroplasty. The hardware components are in anatomic alignment. Gas is identified within the surrounding soft tissues. There is no periprosthetic fracture or dislocation. IMPRESSION: 1. Status post right total knee arthroplasty.  No complications. Electronically Signed   By: Kerby Moors M.D.   On: 05/24/2016 14:57    Disposition: to skilled nursing  Discharge Instructions    Call MD / Call 911    Complete by:  As directed   If you experience chest pain or shortness of breath, CALL 911 and be transported to  the hospital emergency room.  If you develope a fever above 101 F, pus (white drainage) or increased drainage or redness at the wound, or calf pain, call your surgeon's office.   Constipation Prevention    Complete by:  As directed   Drink plenty of fluids.  Prune juice may be helpful.  You may use a stool softener, such as Colace (over the counter) 100 mg twice a day.  Use MiraLax (over the counter) for constipation as needed.   Diet - low sodium heart healthy    Complete by:  As directed   Discharge patient    Complete by:  As directed   Increase activity slowly as tolerated    Complete by:  As directed      Follow-up Information    Mcarthur Rossetti, MD Follow up in 2 week(s).   Specialty:  Orthopedic Surgery Contact information: [address] [place] [address] [phone]            Signed: Mcarthur Rossetti 05/27/2016, 7:33 AM

## 2016-05-28 DIAGNOSIS — M1711 Unilateral primary osteoarthritis, right knee: Secondary | ICD-10-CM | POA: Diagnosis not present

## 2016-05-28 DIAGNOSIS — Z96651 Presence of right artificial knee joint: Secondary | ICD-10-CM | POA: Diagnosis not present

## 2016-06-06 DIAGNOSIS — M25561 Pain in right knee: Secondary | ICD-10-CM | POA: Diagnosis not present

## 2016-06-06 DIAGNOSIS — M1711 Unilateral primary osteoarthritis, right knee: Secondary | ICD-10-CM | POA: Diagnosis not present

## 2016-06-14 DIAGNOSIS — M1711 Unilateral primary osteoarthritis, right knee: Secondary | ICD-10-CM | POA: Diagnosis not present

## 2016-06-14 DIAGNOSIS — Z96651 Presence of right artificial knee joint: Secondary | ICD-10-CM | POA: Diagnosis not present

## 2016-06-18 DIAGNOSIS — M25661 Stiffness of right knee, not elsewhere classified: Secondary | ICD-10-CM | POA: Diagnosis not present

## 2016-06-18 DIAGNOSIS — M25561 Pain in right knee: Secondary | ICD-10-CM | POA: Diagnosis not present

## 2016-06-18 DIAGNOSIS — M25461 Effusion, right knee: Secondary | ICD-10-CM | POA: Diagnosis not present

## 2016-06-20 DIAGNOSIS — M25561 Pain in right knee: Secondary | ICD-10-CM | POA: Diagnosis not present

## 2016-06-20 DIAGNOSIS — M25461 Effusion, right knee: Secondary | ICD-10-CM | POA: Diagnosis not present

## 2016-06-20 DIAGNOSIS — M25661 Stiffness of right knee, not elsewhere classified: Secondary | ICD-10-CM | POA: Diagnosis not present

## 2016-06-24 DIAGNOSIS — M25461 Effusion, right knee: Secondary | ICD-10-CM | POA: Diagnosis not present

## 2016-06-24 DIAGNOSIS — M25561 Pain in right knee: Secondary | ICD-10-CM | POA: Diagnosis not present

## 2016-06-24 DIAGNOSIS — M25661 Stiffness of right knee, not elsewhere classified: Secondary | ICD-10-CM | POA: Diagnosis not present

## 2016-06-26 DIAGNOSIS — Z23 Encounter for immunization: Secondary | ICD-10-CM | POA: Diagnosis not present

## 2016-06-28 DIAGNOSIS — M25461 Effusion, right knee: Secondary | ICD-10-CM | POA: Diagnosis not present

## 2016-06-28 DIAGNOSIS — M25561 Pain in right knee: Secondary | ICD-10-CM | POA: Diagnosis not present

## 2016-06-28 DIAGNOSIS — M25661 Stiffness of right knee, not elsewhere classified: Secondary | ICD-10-CM | POA: Diagnosis not present

## 2016-07-02 DIAGNOSIS — M25561 Pain in right knee: Secondary | ICD-10-CM | POA: Diagnosis not present

## 2016-07-02 DIAGNOSIS — M25661 Stiffness of right knee, not elsewhere classified: Secondary | ICD-10-CM | POA: Diagnosis not present

## 2016-07-02 DIAGNOSIS — M25461 Effusion, right knee: Secondary | ICD-10-CM | POA: Diagnosis not present

## 2016-07-04 ENCOUNTER — Ambulatory Visit (INDEPENDENT_AMBULATORY_CARE_PROVIDER_SITE_OTHER): Payer: Medicare Other | Admitting: Physician Assistant

## 2016-07-04 DIAGNOSIS — M25661 Stiffness of right knee, not elsewhere classified: Secondary | ICD-10-CM | POA: Diagnosis not present

## 2016-07-04 DIAGNOSIS — M25561 Pain in right knee: Secondary | ICD-10-CM | POA: Diagnosis not present

## 2016-07-04 DIAGNOSIS — M1711 Unilateral primary osteoarthritis, right knee: Secondary | ICD-10-CM

## 2016-07-04 DIAGNOSIS — M25461 Effusion, right knee: Secondary | ICD-10-CM | POA: Diagnosis not present

## 2016-07-14 DIAGNOSIS — M25561 Pain in right knee: Secondary | ICD-10-CM | POA: Diagnosis not present

## 2016-07-14 DIAGNOSIS — Z96651 Presence of right artificial knee joint: Secondary | ICD-10-CM | POA: Diagnosis not present

## 2016-07-14 DIAGNOSIS — Z6825 Body mass index (BMI) 25.0-25.9, adult: Secondary | ICD-10-CM | POA: Diagnosis not present

## 2016-07-16 DIAGNOSIS — M25461 Effusion, right knee: Secondary | ICD-10-CM | POA: Diagnosis not present

## 2016-07-16 DIAGNOSIS — M25561 Pain in right knee: Secondary | ICD-10-CM | POA: Diagnosis not present

## 2016-07-16 DIAGNOSIS — M25661 Stiffness of right knee, not elsewhere classified: Secondary | ICD-10-CM | POA: Diagnosis not present

## 2016-07-18 DIAGNOSIS — M25461 Effusion, right knee: Secondary | ICD-10-CM | POA: Diagnosis not present

## 2016-07-18 DIAGNOSIS — M25661 Stiffness of right knee, not elsewhere classified: Secondary | ICD-10-CM | POA: Diagnosis not present

## 2016-07-18 DIAGNOSIS — M25561 Pain in right knee: Secondary | ICD-10-CM | POA: Diagnosis not present

## 2016-07-30 DIAGNOSIS — M25461 Effusion, right knee: Secondary | ICD-10-CM | POA: Diagnosis not present

## 2016-07-30 DIAGNOSIS — M25661 Stiffness of right knee, not elsewhere classified: Secondary | ICD-10-CM | POA: Diagnosis not present

## 2016-07-30 DIAGNOSIS — M25561 Pain in right knee: Secondary | ICD-10-CM | POA: Diagnosis not present

## 2016-08-01 ENCOUNTER — Ambulatory Visit (INDEPENDENT_AMBULATORY_CARE_PROVIDER_SITE_OTHER): Payer: Medicare Other | Admitting: Orthopaedic Surgery

## 2016-08-01 DIAGNOSIS — Z96651 Presence of right artificial knee joint: Secondary | ICD-10-CM

## 2016-08-01 NOTE — Progress Notes (Signed)
Office Visit Note   Patient: Joshua Strong           Date of Birth: 14-May-1931           MRN: [id number] Visit Date: 08/01/2016              Requested by: [name], MD [address] [place], [place] [address] PCP: [name], MD   Assessment & Plan: Visit Diagnoses:  1. Status post total right knee replacement     Plan: He is requesting a follow-up with Dr. Ernestina Patches for repeat epidural steroid injection in his back. He had an injection well before my knee replacement. His surgery on his knee was in August. I would fill [place] Dr. Ernestina Patches performing other intervention in this spine in about 4 weeks from now. Right now I can't find the documentation showed would level Dr. Ernestina Patches provide an injection in but hopefully his records will show that information so he can provide another injection in his back which has done well for in the past. This is due to the stenosis.  As far as his knee goes, he'll continue a home exercise program. I do not need to see him back myself for 3 months. At that visit I like an AP and lateral views right knee.  Follow-Up Instructions: Return in about 4 weeks (around 08/29/2016) for follow-up in 4 weeks with Dr. Ernestina Patches.   Orders:  No orders of the defined types were placed in this encounter.  No orders of the defined types were placed in this encounter.     Procedures: No procedures performed   Clinical Data: No additional findings.   Subjective: Chief Complaint  Patient presents with  . Right Knee - Follow-up    Patient states he's doing well. His ROM and strength is getting better. Still some pain and stiffness after activity at times.    HPI  Review of Systems   Objective: Vital Signs: There were no vitals taken for this visit.  Physical Exam  Ortho Exam His right knee is ligamentous is stable. His range of motion is almost full extension to about 115 of flexion. There is mild swelling but no significant effusion. Specialty Comments:    No specialty comments available.  Imaging: No results found.   PMFS History: Patient Active Problem List   Diagnosis Date Noted  . Osteoarthritis of right knee 05/24/2016  . Status post total right knee replacement 05/24/2016  . Diverticulosis of colon without hemorrhage   . CONSTIPATION 04/19/2010  . ABDOMINAL PAIN, LEFT LOWER QUADRANT 09/18/2009  . EPIGASTRIC PAIN 09/18/2009  . CARCINOMA, RENAL CELL 08/08/2008  . PNEUMONIA 08/08/2008  . ACUTE PANCREATITIS 08/08/2008  . NAUSEA 08/08/2008  . ABDOMINAL PAIN, CHRONIC 08/08/2008   Past Medical History:  Diagnosis Date  . GERD (gastroesophageal reflux disease)   . GI bleed    ulcer  . Hemorrhoid   . Pancreatitis   . Pneumonia   . Renal cell carcinoma ([place])   . Tubular adenoma   . Tubular adenoma 2013    Family History  Problem Relation Age of Onset  . Adopted: Yes    Past Surgical History:  Procedure Laterality Date  . CHOLECYSTECTOMY    . COLONOSCOPY  06/16/2007   Dr. Gala Romney- minimal internal hemorrhoids o/w normal rectum,tubular adenoma  . COLONOSCOPY  07/02/2012   Dr.Rourk- normal rectum, 3 5-7 mm peduncuated polyos, one in the mid sigmoid segment and 2 in the ascending segment. remainder of the colonic  mucosa appeared normal. bx= tubular adenoma  . COLONOSCOPY N/A 07/17/2015   Procedure: COLONOSCOPY;  Surgeon: Daneil Dolin, MD;  Location: [facility]ITE;  Service: Endoscopy;  Laterality: N/A;  730   . EYE SURGERY     cataract with lens implants  . HAND SURGERY    . KNEE SURGERY     right  . NEPHRECTOMY Right   . pancreatic duct stent    . SHOULDER SURGERY     left  . SPINAL FUSION    . testicular tumor    . TOTAL KNEE ARTHROPLASTY Right 05/24/2016   Procedure: RIGHT TOTAL KNEE ARTHROPLASTY;  Surgeon: Mcarthur Rossetti, MD;  Location: [facility] ORS;  Service: Orthopedics;  Laterality: Right;   Social History   Occupational History  . Not on file.   Social History Main Topics  . Smoking status: Former  Smoker    Packs/day: 0.50    Years: 3.00    Types: Cigarettes    Quit date: 05/17/1956  . Smokeless tobacco: Never Used     Comment: quit in 1958  . Alcohol use No  . Drug use: No  . Sexual activity: Not on file

## 2016-08-13 DIAGNOSIS — L570 Actinic keratosis: Secondary | ICD-10-CM | POA: Diagnosis not present

## 2016-08-13 DIAGNOSIS — N289 Disorder of kidney and ureter, unspecified: Secondary | ICD-10-CM | POA: Diagnosis not present

## 2016-08-13 DIAGNOSIS — Z85528 Personal history of other malignant neoplasm of kidney: Secondary | ICD-10-CM | POA: Diagnosis not present

## 2016-08-13 DIAGNOSIS — M353 Polymyalgia rheumatica: Secondary | ICD-10-CM | POA: Diagnosis not present

## 2016-08-13 DIAGNOSIS — K219 Gastro-esophageal reflux disease without esophagitis: Secondary | ICD-10-CM | POA: Diagnosis not present

## 2016-08-13 DIAGNOSIS — M810 Age-related osteoporosis without current pathological fracture: Secondary | ICD-10-CM | POA: Diagnosis not present

## 2016-08-13 DIAGNOSIS — Z85820 Personal history of malignant melanoma of skin: Secondary | ICD-10-CM | POA: Diagnosis not present

## 2016-08-13 DIAGNOSIS — K861 Other chronic pancreatitis: Secondary | ICD-10-CM | POA: Diagnosis not present

## 2016-08-27 ENCOUNTER — Telehealth (INDEPENDENT_AMBULATORY_CARE_PROVIDER_SITE_OTHER): Payer: Self-pay | Admitting: Physical Medicine and Rehabilitation

## 2016-08-27 NOTE — Telephone Encounter (Signed)
ok 

## 2016-08-28 NOTE — Telephone Encounter (Signed)
Pt had Medicare and [employer]. No precert required for both.

## 2016-09-09 ENCOUNTER — Encounter (INDEPENDENT_AMBULATORY_CARE_PROVIDER_SITE_OTHER): Payer: Self-pay

## 2016-09-09 ENCOUNTER — Ambulatory Visit (INDEPENDENT_AMBULATORY_CARE_PROVIDER_SITE_OTHER): Payer: Medicare Other | Admitting: Physical Medicine and Rehabilitation

## 2016-09-09 ENCOUNTER — Encounter (INDEPENDENT_AMBULATORY_CARE_PROVIDER_SITE_OTHER): Payer: Self-pay | Admitting: Physical Medicine and Rehabilitation

## 2016-09-09 VITALS — BP 146/72 | HR 53

## 2016-09-09 DIAGNOSIS — M48062 Spinal stenosis, lumbar region with neurogenic claudication: Secondary | ICD-10-CM | POA: Diagnosis not present

## 2016-09-09 DIAGNOSIS — M5416 Radiculopathy, lumbar region: Secondary | ICD-10-CM | POA: Diagnosis not present

## 2016-09-09 MED ORDER — METHYLPREDNISOLONE ACETATE 80 MG/ML IJ SUSP
80.0000 mg | Freq: Once | INTRAMUSCULAR | Status: AC
  Administered 2016-09-09: 80 mg

## 2016-09-09 MED ORDER — LIDOCAINE HCL (PF) 1 % IJ SOLN
0.3300 mL | Freq: Once | INTRAMUSCULAR | Status: AC
  Administered 2016-09-09: 0.3 mL

## 2016-09-09 NOTE — Progress Notes (Signed)
Joshua Strong - 82 y.o. male MRN [id number]  Date of birth: 1930/11/26  Office Visit Note: Visit Date: 09/09/2016 PCP: Joshua Hilding, MD Referred by: Sasser, Silvestre Moment, MD  Subjective: Chief Complaint  Patient presents with  . Lower Back - Pain   HPI: Joshua Strong is an 82 year old gentleman with history of multiple spine and joint complaints. He comes in today after having had a right total knee replacement in August. He states that his therapy has been hampered somewhat by his back pain. See history of lumbar stenosis at L4-5 which was moderate on the last MRI performed a couple years ago. He is getting some radicular complaints down both legs. His symptoms are worse with standing and ambulating but he does get relief somewhat with movement in general. He actually gets a lot of pain at night when he is trying to sleep.    ROS Otherwise per HPI.  Assessment & Plan: Visit Diagnoses:  1. Spinal stenosis of lumbar region with neurogenic claudication     Plan: Findings:  Chronic worsening low back and right more than left radicular pain consistent with L4-5 stenosis. He may be having some pain is facet mediated. He has had right total knee replacement is still trying to do exercises and rehabilitation. He's had prior lumbar surgery. MRI was only a couple years old but would consider repeating this at some point. We are going to repeat right L4 transforaminal injections today.    Meds & Orders:  Meds ordered this encounter  Medications  . lidocaine (PF) (XYLOCAINE) 1 % injection 0.3 mL  . methylPREDNISolone acetate (DEPO-MEDROL) injection 80 mg    Orders Placed This Encounter  Procedures  . Epidural Steroid injection    Follow-up: Return if symptoms worsen or fail to improve, after 2 weeks.   Procedures: No procedures performed  Lumbosacral Transforaminal Epidural Steroid Injection - Infraneural Approach with Fluoroscopic Guidance  Patient: Joshua Strong      Date of Birth:  1931/06/10 MRN: [id number] PCP: Joshua Hilding, MD      Visit Date: 09/09/2016   Universal Protocol:    Date/Time: 09/09/1709:31 AM  Consent Given By: the patient  Position: PRONE   Additional Comments: Vital signs were monitored before and after the procedure. Patient was prepped and draped in the usual sterile fashion. The correct patient, procedure, and site was verified.   Injection Procedure Details:  Procedure Site One Meds Administered:  Meds ordered this encounter  Medications  . lidocaine (PF) (XYLOCAINE) 1 % injection 0.3 mL  . methylPREDNISolone acetate (DEPO-MEDROL) injection 80 mg      Laterality: Bilateral  Location/Site:  L4-L5  Needle size: 22 G  Needle type: Spinal  Needle Placement: Transforaminal  Findings:  -Contrast Used: 1 mL iohexol 180 mg iodine/mL   -Comments: Excellent flow of contrast along the nerve and into the epidural space.  Procedure Details: After squaring off the end-plates of the desired vertebral level to get a true AP view, the C-arm was obliqued to the painful side so that the superior articulating process is positioned about 1/3 the length of the inferior endplate.  The needle was aimed toward the junction of the superior articular process and the transverse process of the inferior vertebrae. The needle's initial entry is in the lower third of the foramen through Kambin's triangle. The soft tissues overlying this target were infiltrated with 2-3 ml. of 1% Lidocaine without Epinephrine.  The spinal needle was then inserted and advanced toward the target  using a "trajectory" view along the fluoroscope beam.  Under AP and lateral visualization, the needle was advanced so it did not puncture dura and did not traverse medially beyond the 6 o'clock position of the pedicle. Bi-planar projections were used to confirm position. Aspiration was confirmed to be negative for CSF and/or blood. A 1-2 ml. volume of Isovue-250 was injected and flow of  contrast was noted at each level. Radiographs were obtained for documentation purposes.   After attaining the desired flow of contrast documented above, a 0.5 to 1.0 ml test dose of 0.25% Marcaine was injected into each respective transforaminal space.  The patient was observed for 90 seconds post injection.  After no sensory deficits were reported, and normal lower extremity motor function was noted,   the above injectate was administered so that equal amounts of the injectate were placed at each foramen (level) into the transforaminal epidural space.   Additional Comments:  The patient tolerated the procedure well Dressing: Band-Aid    Post-procedure details: Patient was observed during the procedure. Post-procedure instructions were reviewed.  Patient left the clinic in stable condition.   Clinical History: No specialty comments available.  He reports that he quit smoking about 60 years ago. His smoking use included Cigarettes. He has a 1.50 pack-year smoking history. He has never used smokeless tobacco. No results for input(s): HGBA1C, LABURIC in the last 8760 hours.  Objective:  VS:  HT:    WT:   BMI:     BP:(!) 146/72  HR:(!) 53bpm  TEMP: ( )  RESP:98 % Physical Exam  Musculoskeletal:  The patient ambulates without aid. He has a forward flexed spine with stiffthrough the spine. He has well-healed surgical scar. He has good distal strength.    Ortho Exam Imaging: No results found.  Past Medical/Family/Surgical/Social History: Medications & Allergies reviewed per EMR Patient Active Problem List   Diagnosis Date Noted  . Osteoarthritis of right knee 05/24/2016  . Status post total right knee replacement 05/24/2016  . Diverticulosis of colon without hemorrhage   . CONSTIPATION 04/19/2010  . ABDOMINAL PAIN, LEFT LOWER QUADRANT 09/18/2009  . EPIGASTRIC PAIN 09/18/2009  . CARCINOMA, RENAL CELL 08/08/2008  . PNEUMONIA 08/08/2008  . ACUTE PANCREATITIS 08/08/2008  . NAUSEA  08/08/2008  . ABDOMINAL PAIN, CHRONIC 08/08/2008   Past Medical History:  Diagnosis Date  . GERD (gastroesophageal reflux disease)   . GI bleed    ulcer  . Hemorrhoid   . Pancreatitis   . Pneumonia   . Renal cell carcinoma ([place])   . Tubular adenoma   . Tubular adenoma 2013   Family History  Problem Relation Age of Onset  . Adopted: Yes   Past Surgical History:  Procedure Laterality Date  . CHOLECYSTECTOMY    . COLONOSCOPY  06/16/2007   Dr. Gala Romney- minimal internal hemorrhoids o/w normal rectum,tubular adenoma  . COLONOSCOPY  07/02/2012   Dr.Rourk- normal rectum, 3 5-7 mm peduncuated polyos, one in the mid sigmoid segment and 2 in the ascending segment. remainder of the colonic mucosa appeared normal. bx= tubular adenoma  . COLONOSCOPY N/A 07/17/2015   Procedure: COLONOSCOPY;  Surgeon: Daneil Dolin, MD;  Location[facility];  Service: Endoscopy;  Laterality: N/A;  730   . EYE SURGERY     cataract with lens implants  . HAND SURGERY    . KNEE SURGERY     right  . NEPHRECTOMY Right   . pancreatic duct stent    . SHOULDER SURGERY  left  . SPINAL FUSION    . testicular tumor    . TOTAL KNEE ARTHROPLASTY Right 05/24/2016   Procedure: RIGHT TOTAL KNEE ARTHROPLASTY;  Surgeon: Mcarthur Rossetti, MD;  Location: [facility];  Service: Orthopedics;  Laterality: Right;   Social History   Occupational History  . Not on file.   Social History Main Topics  . Smoking status: Former Smoker    Packs/day: 0.50    Years: 3.00    Types: Cigarettes    Quit date: 05/17/1956  . Smokeless tobacco: Never Used     Comment: quit in 1958  . Alcohol use No  . Drug use: No  . Sexual activity: Not on file

## 2016-09-09 NOTE — Procedures (Signed)
Lumbosacral Transforaminal Epidural Steroid Injection - Infraneural Approach with Fluoroscopic Guidance  Patient: Joshua Strong      Date of Birth: 1930-12-24 MRN: [id number] PCP: [name], MD      Visit Date: 09/09/2016   Universal Protocol:    Date/Time: 09/09/1709:31 AM  Consent Given By: the patient  Position: PRONE   Additional Comments: Vital signs were monitored before and after the procedure. Patient was prepped and draped in the usual sterile fashion. The correct patient, procedure, and site was verified.   Injection Procedure Details:  Procedure Site One Meds Administered:  Meds ordered this encounter  Medications  . lidocaine (PF) (XYLOCAINE) 1 % injection 0.3 mL  . methylPREDNISolone acetate (DEPO-MEDROL) injection 80 mg      Laterality: Bilateral  Location/Site:  L4-L5  Needle size: 22 G  Needle type: Spinal  Needle Placement: Transforaminal  Findings:  -Contrast Used: 1 mL iohexol 180 mg iodine/mL   -Comments: Excellent flow of contrast along the nerve and into the epidural space.  Procedure Details: After squaring off the end-plates of the desired vertebral level to get a true AP view, the C-arm was obliqued to the painful side so that the superior articulating process is positioned about 1/3 the length of the inferior endplate.  The needle was aimed toward the junction of the superior articular process and the transverse process of the inferior vertebrae. The needle's initial entry is in the lower third of the foramen through Kambin's triangle. The soft tissues overlying this target were infiltrated with 2-3 ml. of 1% Lidocaine without Epinephrine.  The spinal needle was then inserted and advanced toward the target using a "trajectory" view along the fluoroscope beam.  Under AP and lateral visualization, the needle was advanced so it did not puncture dura and did not traverse medially beyond the 6 o'clock position of the pedicle. Bi-planar projections  were used to confirm position. Aspiration was confirmed to be negative for CSF and/or blood. A 1-2 ml. volume of Isovue-250 was injected and flow of contrast was noted at each level. Radiographs were obtained for documentation purposes.   After attaining the desired flow of contrast documented above, a 0.5 to 1.0 ml test dose of 0.25% Marcaine was injected into each respective transforaminal space.  The patient was observed for 90 seconds post injection.  After no sensory deficits were reported, and normal lower extremity motor function was noted,   the above injectate was administered so that equal amounts of the injectate were placed at each foramen (level) into the transforaminal epidural space.   Additional Comments:  The patient tolerated the procedure well Dressing: Band-Aid    Post-procedure details: Patient was observed during the procedure. Post-procedure instructions were reviewed.  Patient left the clinic in stable condition.

## 2016-09-09 NOTE — Patient Instructions (Signed)

## 2016-09-11 DIAGNOSIS — M545 Low back pain: Secondary | ICD-10-CM | POA: Diagnosis not present

## 2016-09-11 DIAGNOSIS — Z6827 Body mass index (BMI) 27.0-27.9, adult: Secondary | ICD-10-CM | POA: Diagnosis not present

## 2016-09-11 DIAGNOSIS — M1712 Unilateral primary osteoarthritis, left knee: Secondary | ICD-10-CM | POA: Diagnosis not present

## 2016-09-11 DIAGNOSIS — F331 Major depressive disorder, recurrent, moderate: Secondary | ICD-10-CM | POA: Diagnosis not present

## 2016-09-11 DIAGNOSIS — M5431 Sciatica, right side: Secondary | ICD-10-CM | POA: Diagnosis not present

## 2016-09-18 DIAGNOSIS — E785 Hyperlipidemia, unspecified: Secondary | ICD-10-CM | POA: Diagnosis not present

## 2016-09-18 DIAGNOSIS — M150 Primary generalized (osteo)arthritis: Secondary | ICD-10-CM | POA: Diagnosis not present

## 2016-09-18 DIAGNOSIS — R51 Headache: Secondary | ICD-10-CM | POA: Diagnosis not present

## 2016-09-18 DIAGNOSIS — M353 Polymyalgia rheumatica: Secondary | ICD-10-CM | POA: Diagnosis not present

## 2016-09-18 DIAGNOSIS — M810 Age-related osteoporosis without current pathological fracture: Secondary | ICD-10-CM | POA: Diagnosis not present

## 2016-09-18 DIAGNOSIS — Z79899 Other long term (current) drug therapy: Secondary | ICD-10-CM | POA: Diagnosis not present

## 2016-09-18 DIAGNOSIS — Z87891 Personal history of nicotine dependence: Secondary | ICD-10-CM | POA: Diagnosis not present

## 2016-09-18 DIAGNOSIS — Z885 Allergy status to narcotic agent status: Secondary | ICD-10-CM | POA: Diagnosis not present

## 2016-09-18 DIAGNOSIS — Z888 Allergy status to other drugs, medicaments and biological substances status: Secondary | ICD-10-CM | POA: Diagnosis not present

## 2016-09-24 DIAGNOSIS — M1712 Unilateral primary osteoarthritis, left knee: Secondary | ICD-10-CM | POA: Diagnosis not present

## 2016-09-24 DIAGNOSIS — F331 Major depressive disorder, recurrent, moderate: Secondary | ICD-10-CM | POA: Diagnosis not present

## 2016-09-24 DIAGNOSIS — M5431 Sciatica, right side: Secondary | ICD-10-CM | POA: Diagnosis not present

## 2016-09-24 DIAGNOSIS — M545 Low back pain: Secondary | ICD-10-CM | POA: Diagnosis not present

## 2016-09-24 DIAGNOSIS — R410 Disorientation, unspecified: Secondary | ICD-10-CM | POA: Diagnosis not present

## 2016-09-24 DIAGNOSIS — Z6826 Body mass index (BMI) 26.0-26.9, adult: Secondary | ICD-10-CM | POA: Diagnosis not present

## 2016-10-25 DIAGNOSIS — E7800 Pure hypercholesterolemia, unspecified: Secondary | ICD-10-CM | POA: Diagnosis not present

## 2016-10-25 DIAGNOSIS — N183 Chronic kidney disease, stage 3 (moderate): Secondary | ICD-10-CM | POA: Diagnosis not present

## 2016-10-25 DIAGNOSIS — K210 Gastro-esophageal reflux disease with esophagitis: Secondary | ICD-10-CM | POA: Diagnosis not present

## 2016-10-25 DIAGNOSIS — K859 Acute pancreatitis without necrosis or infection, unspecified: Secondary | ICD-10-CM | POA: Diagnosis not present

## 2016-10-25 DIAGNOSIS — M8589 Other specified disorders of bone density and structure, multiple sites: Secondary | ICD-10-CM | POA: Diagnosis not present

## 2016-10-25 DIAGNOSIS — R4189 Other symptoms and signs involving cognitive functions and awareness: Secondary | ICD-10-CM | POA: Diagnosis not present

## 2016-10-25 DIAGNOSIS — F331 Major depressive disorder, recurrent, moderate: Secondary | ICD-10-CM | POA: Diagnosis not present

## 2016-10-28 DIAGNOSIS — M545 Low back pain: Secondary | ICD-10-CM | POA: Diagnosis not present

## 2016-10-28 DIAGNOSIS — Z0001 Encounter for general adult medical examination with abnormal findings: Secondary | ICD-10-CM | POA: Diagnosis not present

## 2016-10-28 DIAGNOSIS — Z23 Encounter for immunization: Secondary | ICD-10-CM | POA: Diagnosis not present

## 2016-10-28 DIAGNOSIS — R300 Dysuria: Secondary | ICD-10-CM | POA: Diagnosis not present

## 2016-10-28 DIAGNOSIS — E7800 Pure hypercholesterolemia, unspecified: Secondary | ICD-10-CM | POA: Diagnosis not present

## 2016-10-28 DIAGNOSIS — F331 Major depressive disorder, recurrent, moderate: Secondary | ICD-10-CM | POA: Diagnosis not present

## 2016-10-28 DIAGNOSIS — Z6827 Body mass index (BMI) 27.0-27.9, adult: Secondary | ICD-10-CM | POA: Diagnosis not present

## 2016-10-28 DIAGNOSIS — N183 Chronic kidney disease, stage 3 (moderate): Secondary | ICD-10-CM | POA: Diagnosis not present

## 2016-10-28 DIAGNOSIS — M1991 Primary osteoarthritis, unspecified site: Secondary | ICD-10-CM | POA: Diagnosis not present

## 2016-10-28 DIAGNOSIS — K861 Other chronic pancreatitis: Secondary | ICD-10-CM | POA: Diagnosis not present

## 2016-12-02 DIAGNOSIS — K861 Other chronic pancreatitis: Secondary | ICD-10-CM | POA: Diagnosis not present

## 2016-12-02 DIAGNOSIS — Z79899 Other long term (current) drug therapy: Secondary | ICD-10-CM | POA: Diagnosis not present

## 2016-12-02 DIAGNOSIS — K219 Gastro-esophageal reflux disease without esophagitis: Secondary | ICD-10-CM | POA: Diagnosis not present

## 2016-12-12 DIAGNOSIS — K861 Other chronic pancreatitis: Secondary | ICD-10-CM | POA: Diagnosis not present

## 2016-12-25 DIAGNOSIS — Z23 Encounter for immunization: Secondary | ICD-10-CM | POA: Diagnosis not present

## 2016-12-25 DIAGNOSIS — Z6827 Body mass index (BMI) 27.0-27.9, adult: Secondary | ICD-10-CM | POA: Diagnosis not present

## 2016-12-25 DIAGNOSIS — S50811A Abrasion of right forearm, initial encounter: Secondary | ICD-10-CM | POA: Diagnosis not present

## 2017-01-13 DIAGNOSIS — Z9181 History of falling: Secondary | ICD-10-CM | POA: Diagnosis not present

## 2017-01-13 DIAGNOSIS — Z79899 Other long term (current) drug therapy: Secondary | ICD-10-CM | POA: Diagnosis not present

## 2017-01-13 DIAGNOSIS — M25532 Pain in left wrist: Secondary | ICD-10-CM | POA: Diagnosis not present

## 2017-01-13 DIAGNOSIS — S8991XA Unspecified injury of right lower leg, initial encounter: Secondary | ICD-10-CM | POA: Diagnosis not present

## 2017-01-13 DIAGNOSIS — R079 Chest pain, unspecified: Secondary | ICD-10-CM | POA: Diagnosis not present

## 2017-01-13 DIAGNOSIS — M25511 Pain in right shoulder: Secondary | ICD-10-CM | POA: Diagnosis not present

## 2017-01-13 DIAGNOSIS — S40021A Contusion of right upper arm, initial encounter: Secondary | ICD-10-CM | POA: Diagnosis not present

## 2017-01-13 DIAGNOSIS — N390 Urinary tract infection, site not specified: Secondary | ICD-10-CM | POA: Diagnosis not present

## 2017-01-13 DIAGNOSIS — S60211A Contusion of right wrist, initial encounter: Secondary | ICD-10-CM | POA: Diagnosis not present

## 2017-01-13 DIAGNOSIS — S8001XA Contusion of right knee, initial encounter: Secondary | ICD-10-CM | POA: Diagnosis not present

## 2017-01-13 DIAGNOSIS — S6992XA Unspecified injury of left wrist, hand and finger(s), initial encounter: Secondary | ICD-10-CM | POA: Diagnosis not present

## 2017-01-13 DIAGNOSIS — Z85528 Personal history of other malignant neoplasm of kidney: Secondary | ICD-10-CM | POA: Diagnosis not present

## 2017-01-13 DIAGNOSIS — Z8674 Personal history of sudden cardiac arrest: Secondary | ICD-10-CM | POA: Diagnosis not present

## 2017-01-13 DIAGNOSIS — S299XXA Unspecified injury of thorax, initial encounter: Secondary | ICD-10-CM | POA: Diagnosis not present

## 2017-01-13 DIAGNOSIS — S4991XA Unspecified injury of right shoulder and upper arm, initial encounter: Secondary | ICD-10-CM | POA: Diagnosis not present

## 2017-01-13 DIAGNOSIS — R319 Hematuria, unspecified: Secondary | ICD-10-CM | POA: Diagnosis not present

## 2017-01-13 DIAGNOSIS — Z96659 Presence of unspecified artificial knee joint: Secondary | ICD-10-CM | POA: Diagnosis not present

## 2017-01-13 DIAGNOSIS — S20211A Contusion of right front wall of thorax, initial encounter: Secondary | ICD-10-CM | POA: Diagnosis not present

## 2017-01-13 DIAGNOSIS — R0781 Pleurodynia: Secondary | ICD-10-CM | POA: Diagnosis not present

## 2017-01-13 DIAGNOSIS — Z87891 Personal history of nicotine dependence: Secondary | ICD-10-CM | POA: Diagnosis not present

## 2017-01-13 DIAGNOSIS — M79601 Pain in right arm: Secondary | ICD-10-CM | POA: Diagnosis not present

## 2017-01-13 DIAGNOSIS — M810 Age-related osteoporosis without current pathological fracture: Secondary | ICD-10-CM | POA: Diagnosis not present

## 2017-01-13 DIAGNOSIS — M25561 Pain in right knee: Secondary | ICD-10-CM | POA: Diagnosis not present

## 2017-01-15 DIAGNOSIS — R296 Repeated falls: Secondary | ICD-10-CM | POA: Diagnosis not present

## 2017-01-15 DIAGNOSIS — Z6827 Body mass index (BMI) 27.0-27.9, adult: Secondary | ICD-10-CM | POA: Diagnosis not present

## 2017-01-21 DIAGNOSIS — M6281 Muscle weakness (generalized): Secondary | ICD-10-CM | POA: Diagnosis not present

## 2017-01-21 DIAGNOSIS — R2689 Other abnormalities of gait and mobility: Secondary | ICD-10-CM | POA: Diagnosis not present

## 2017-01-21 DIAGNOSIS — M25552 Pain in left hip: Secondary | ICD-10-CM | POA: Diagnosis not present

## 2017-01-22 DIAGNOSIS — M6281 Muscle weakness (generalized): Secondary | ICD-10-CM | POA: Diagnosis not present

## 2017-01-22 DIAGNOSIS — R2689 Other abnormalities of gait and mobility: Secondary | ICD-10-CM | POA: Diagnosis not present

## 2017-01-22 DIAGNOSIS — M25552 Pain in left hip: Secondary | ICD-10-CM | POA: Diagnosis not present

## 2017-01-28 DIAGNOSIS — M6281 Muscle weakness (generalized): Secondary | ICD-10-CM | POA: Diagnosis not present

## 2017-01-28 DIAGNOSIS — M25552 Pain in left hip: Secondary | ICD-10-CM | POA: Diagnosis not present

## 2017-01-28 DIAGNOSIS — R2689 Other abnormalities of gait and mobility: Secondary | ICD-10-CM | POA: Diagnosis not present

## 2017-01-30 DIAGNOSIS — M6281 Muscle weakness (generalized): Secondary | ICD-10-CM | POA: Diagnosis not present

## 2017-01-30 DIAGNOSIS — M25552 Pain in left hip: Secondary | ICD-10-CM | POA: Diagnosis not present

## 2017-01-30 DIAGNOSIS — R2689 Other abnormalities of gait and mobility: Secondary | ICD-10-CM | POA: Diagnosis not present

## 2017-02-04 DIAGNOSIS — M6281 Muscle weakness (generalized): Secondary | ICD-10-CM | POA: Diagnosis not present

## 2017-02-04 DIAGNOSIS — R2689 Other abnormalities of gait and mobility: Secondary | ICD-10-CM | POA: Diagnosis not present

## 2017-02-04 DIAGNOSIS — M25552 Pain in left hip: Secondary | ICD-10-CM | POA: Diagnosis not present

## 2017-02-06 DIAGNOSIS — R413 Other amnesia: Secondary | ICD-10-CM | POA: Diagnosis not present

## 2017-02-06 DIAGNOSIS — M353 Polymyalgia rheumatica: Secondary | ICD-10-CM | POA: Diagnosis not present

## 2017-02-06 DIAGNOSIS — N289 Disorder of kidney and ureter, unspecified: Secondary | ICD-10-CM | POA: Diagnosis not present

## 2017-02-06 DIAGNOSIS — R2689 Other abnormalities of gait and mobility: Secondary | ICD-10-CM | POA: Diagnosis not present

## 2017-02-06 DIAGNOSIS — Z85528 Personal history of other malignant neoplasm of kidney: Secondary | ICD-10-CM | POA: Diagnosis not present

## 2017-02-06 DIAGNOSIS — K219 Gastro-esophageal reflux disease without esophagitis: Secondary | ICD-10-CM | POA: Diagnosis not present

## 2017-02-06 DIAGNOSIS — M810 Age-related osteoporosis without current pathological fracture: Secondary | ICD-10-CM | POA: Diagnosis not present

## 2017-02-06 DIAGNOSIS — Z9181 History of falling: Secondary | ICD-10-CM | POA: Diagnosis not present

## 2017-02-06 DIAGNOSIS — K861 Other chronic pancreatitis: Secondary | ICD-10-CM | POA: Diagnosis not present

## 2017-02-10 DIAGNOSIS — K861 Other chronic pancreatitis: Secondary | ICD-10-CM | POA: Diagnosis not present

## 2017-02-10 DIAGNOSIS — K219 Gastro-esophageal reflux disease without esophagitis: Secondary | ICD-10-CM | POA: Diagnosis not present

## 2017-02-10 DIAGNOSIS — K654 Sclerosing mesenteritis: Secondary | ICD-10-CM | POA: Diagnosis not present

## 2017-02-13 DIAGNOSIS — M25552 Pain in left hip: Secondary | ICD-10-CM | POA: Diagnosis not present

## 2017-02-13 DIAGNOSIS — M6281 Muscle weakness (generalized): Secondary | ICD-10-CM | POA: Diagnosis not present

## 2017-02-13 DIAGNOSIS — R2689 Other abnormalities of gait and mobility: Secondary | ICD-10-CM | POA: Diagnosis not present

## 2017-02-14 DIAGNOSIS — R258 Other abnormal involuntary movements: Secondary | ICD-10-CM | POA: Diagnosis not present

## 2017-02-14 DIAGNOSIS — R253 Fasciculation: Secondary | ICD-10-CM | POA: Diagnosis not present

## 2017-02-14 DIAGNOSIS — H53149 Visual discomfort, unspecified: Secondary | ICD-10-CM | POA: Diagnosis not present

## 2017-02-14 DIAGNOSIS — R252 Cramp and spasm: Secondary | ICD-10-CM | POA: Diagnosis not present

## 2017-02-14 DIAGNOSIS — R296 Repeated falls: Secondary | ICD-10-CM | POA: Diagnosis not present

## 2017-02-14 DIAGNOSIS — M353 Polymyalgia rheumatica: Secondary | ICD-10-CM | POA: Diagnosis not present

## 2017-02-14 DIAGNOSIS — R202 Paresthesia of skin: Secondary | ICD-10-CM | POA: Diagnosis not present

## 2017-02-14 DIAGNOSIS — R51 Headache: Secondary | ICD-10-CM | POA: Diagnosis not present

## 2017-02-14 DIAGNOSIS — K861 Other chronic pancreatitis: Secondary | ICD-10-CM | POA: Diagnosis not present

## 2017-02-14 DIAGNOSIS — R2689 Other abnormalities of gait and mobility: Secondary | ICD-10-CM | POA: Diagnosis not present

## 2017-02-14 DIAGNOSIS — R413 Other amnesia: Secondary | ICD-10-CM | POA: Diagnosis not present

## 2017-02-14 DIAGNOSIS — M1990 Unspecified osteoarthritis, unspecified site: Secondary | ICD-10-CM | POA: Diagnosis not present

## 2017-02-14 DIAGNOSIS — M545 Low back pain: Secondary | ICD-10-CM | POA: Diagnosis not present

## 2017-02-14 DIAGNOSIS — E611 Iron deficiency: Secondary | ICD-10-CM | POA: Diagnosis not present

## 2017-02-14 DIAGNOSIS — F411 Generalized anxiety disorder: Secondary | ICD-10-CM | POA: Diagnosis not present

## 2017-02-17 ENCOUNTER — Ambulatory Visit (INDEPENDENT_AMBULATORY_CARE_PROVIDER_SITE_OTHER): Payer: Medicare Other | Admitting: Orthopaedic Surgery

## 2017-02-17 ENCOUNTER — Encounter (INDEPENDENT_AMBULATORY_CARE_PROVIDER_SITE_OTHER): Payer: Self-pay

## 2017-02-17 ENCOUNTER — Ambulatory Visit (INDEPENDENT_AMBULATORY_CARE_PROVIDER_SITE_OTHER): Payer: Medicare Other

## 2017-02-17 DIAGNOSIS — G8929 Other chronic pain: Secondary | ICD-10-CM

## 2017-02-17 DIAGNOSIS — Z96651 Presence of right artificial knee joint: Secondary | ICD-10-CM

## 2017-02-17 DIAGNOSIS — M25562 Pain in left knee: Secondary | ICD-10-CM | POA: Diagnosis not present

## 2017-02-17 MED ORDER — METHYLPREDNISOLONE ACETATE 40 MG/ML IJ SUSP
40.0000 mg | INTRAMUSCULAR | Status: AC | PRN
  Administered 2017-02-17: 40 mg via INTRA_ARTICULAR

## 2017-02-17 MED ORDER — LIDOCAINE HCL 1 % IJ SOLN
3.0000 mL | INTRAMUSCULAR | Status: AC | PRN
  Administered 2017-02-17: 3 mL

## 2017-02-17 NOTE — Progress Notes (Signed)
Office Visit Note   Patient: Joshua Strong           Date of Birth: [birth year]           MRN: [id number] Visit Date: 02/17/2017              Requested by: Sasser, Silvestre Moment, MD [place], [place] [address] PCP: [name], MD   Assessment & Plan: Visit Diagnoses:  1. History of total right knee replacement   2. Chronic pain of left knee     Plan: He tolerated the steroid injection well on his left knee. I will see him back in 3 months but no x-rays are needed. We'll make sure that he is making progress with stronger quad muscles. Certainly we can always consider hyaluronic acid in his left knee at that point if needed.  Follow-Up Instructions: Return in about 3 months (around 05/20/2017).   Orders:  Orders Placed This Encounter  Procedures  . Large Joint Injection/Arthrocentesis  . XR Knee 1-2 Views Right   No orders of the defined types were placed in this encounter.     Procedures: Large Joint Inj Date/Time: 02/17/2017 3:01 PM Performed by: Mcarthur Rossetti Authorized by: Mcarthur Rossetti   Location:  Knee Site:  L knee Ultrasound Guidance: No   Fluoroscopic Guidance: No   Arthrogram: No   Medications:  3 mL lidocaine 1 %; 40 mg methylPREDNISolone acetate 40 MG/ML     Clinical Data: No additional findings.   Subjective: No chief complaint on file. Patient is well-known to me. He is following up 9 months status post a right total knee arthroplasty. He is a 83 years old. He still having some problems going up and downstairs and I suggest completed physical therapy again for some balance and coordination issues. Follow about 4 times his wife says. He says his left knee actually is bothering him today. Once the medial side of his left knee is sources pain preceded right knee is doing well except for just having problems going up and downstairs mainly. His pain on his left knee has about 6 out of 10 at times. It's starting to affect his activities  daily living, his quality of life, and his mobility. It's a dull aching pain more so with activities  HPI  Review of Systems He currently denies any fever, chills, nausea, vomiting, headache, chest pain, shortness of breath  Objective: Vital Signs: There were no vitals taken for this visit.  Physical Exam He is alert and oriented 3 and in no acute distress Ortho Exam Examination of his right operative knee shows well-healed surgical incision. There is only mild swelling of the knee with good range of motion overall. His quad muscles deathly week. He has pain over the pes bursa area as well. His left knee has manages medial joint line tenderness with weak quad muscles. Range of motion Sykes on the left knee with no effusion. Specialty Comments:  No specialty comments available.  Imaging: Xr Knee 1-2 Views Right  Result Date: 02/17/2017 2 views of the right knee show well-seated implant with no, getting features. There is no effusion. Alignment is well maintained. You can see on the AP view of his left knee shows varus malalignment with medial compartment arthritic changes and narrowing.    PMFS History: Patient Active Problem List   Diagnosis Date Noted  . History of total right knee replacement 02/17/2017  . Chronic pain of left knee 02/17/2017  .  Osteoarthritis of right knee 05/24/2016  . Status post total right knee replacement 05/24/2016  . Diverticulosis of colon without hemorrhage   . CONSTIPATION 04/19/2010  . ABDOMINAL PAIN, LEFT LOWER QUADRANT 09/18/2009  . EPIGASTRIC PAIN 09/18/2009  . CARCINOMA, RENAL CELL 08/08/2008  . PNEUMONIA 08/08/2008  . ACUTE PANCREATITIS 08/08/2008  . NAUSEA 08/08/2008  . ABDOMINAL PAIN, CHRONIC 08/08/2008   Past Medical History:  Diagnosis Date  . GERD (gastroesophageal reflux disease)   . GI bleed    ulcer  . Hemorrhoid   . Pancreatitis   . Pneumonia   . Renal cell carcinoma ([place])   . Tubular adenoma   . Tubular adenoma 2013      Family History  Problem Relation Age of Onset  . Adopted: Yes    Past Surgical History:  Procedure Laterality Date  . CHOLECYSTECTOMY    . COLONOSCOPY  06/16/2007   Dr. Gala Romney- minimal internal hemorrhoids o/w normal rectum,tubular adenoma  . COLONOSCOPY  07/02/2012   Dr.Rourk- normal rectum, 3 5-7 mm peduncuated polyos, one in the mid sigmoid segment and 2 in the ascending segment. remainder of the colonic mucosa appeared normal. bx= tubular adenoma  . COLONOSCOPY N/A 07/17/2015   Procedure: COLONOSCOPY;  Surgeon: Daneil Dolin, MD;  [facility];  Service: Endoscopy;  Laterality: N/A;  730   . EYE SURGERY     cataract with lens implants  . HAND SURGERY    . KNEE SURGERY     right  . NEPHRECTOMY Right   . pancreatic duct stent    . SHOULDER SURGERY     left  . SPINAL FUSION    . testicular tumor    . TOTAL KNEE ARTHROPLASTY Right 05/24/2016   Procedure: RIGHT TOTAL KNEE ARTHROPLASTY;  Surgeon: Mcarthur Rossetti, MD;  Location: [facility];  Service: Orthopedics;  Laterality: Right;   Social History   Occupational History  . Not on file.   Social History Main Topics  . Smoking status: Former Smoker    Packs/day: 0.50    Years: 3.00    Types: Cigarettes    Quit date: 05/17/1956  . Smokeless tobacco: Never Used     Comment: quit in 1958  . Alcohol use No  . Drug use: No  . Sexual activity: Not on file

## 2017-02-18 DIAGNOSIS — L570 Actinic keratosis: Secondary | ICD-10-CM | POA: Diagnosis not present

## 2017-02-28 DIAGNOSIS — R51 Headache: Secondary | ICD-10-CM | POA: Diagnosis not present

## 2017-03-14 DIAGNOSIS — Z6827 Body mass index (BMI) 27.0-27.9, adult: Secondary | ICD-10-CM | POA: Diagnosis not present

## 2017-03-14 DIAGNOSIS — R51 Headache: Secondary | ICD-10-CM | POA: Diagnosis not present

## 2017-03-14 DIAGNOSIS — R05 Cough: Secondary | ICD-10-CM | POA: Diagnosis not present

## 2017-03-14 DIAGNOSIS — J309 Allergic rhinitis, unspecified: Secondary | ICD-10-CM | POA: Diagnosis not present

## 2017-03-18 DIAGNOSIS — R51 Headache: Secondary | ICD-10-CM | POA: Diagnosis not present

## 2017-03-25 DIAGNOSIS — K2960 Other gastritis without bleeding: Secondary | ICD-10-CM | POA: Diagnosis not present

## 2017-03-25 DIAGNOSIS — K861 Other chronic pancreatitis: Secondary | ICD-10-CM | POA: Diagnosis not present

## 2017-03-25 DIAGNOSIS — K2930 Chronic superficial gastritis without bleeding: Secondary | ICD-10-CM | POA: Diagnosis not present

## 2017-03-25 DIAGNOSIS — K209 Esophagitis, unspecified: Secondary | ICD-10-CM | POA: Diagnosis not present

## 2017-03-25 DIAGNOSIS — B9681 Helicobacter pylori [H. pylori] as the cause of diseases classified elsewhere: Secondary | ICD-10-CM | POA: Diagnosis not present

## 2017-03-25 DIAGNOSIS — K2980 Duodenitis without bleeding: Secondary | ICD-10-CM | POA: Diagnosis not present

## 2017-03-25 DIAGNOSIS — Z87891 Personal history of nicotine dependence: Secondary | ICD-10-CM | POA: Diagnosis not present

## 2017-03-25 DIAGNOSIS — Z85528 Personal history of other malignant neoplasm of kidney: Secondary | ICD-10-CM | POA: Diagnosis not present

## 2017-03-28 DIAGNOSIS — R51 Headache: Secondary | ICD-10-CM | POA: Diagnosis not present

## 2017-05-19 DIAGNOSIS — F331 Major depressive disorder, recurrent, moderate: Secondary | ICD-10-CM | POA: Diagnosis not present

## 2017-05-19 DIAGNOSIS — R5383 Other fatigue: Secondary | ICD-10-CM | POA: Diagnosis not present

## 2017-05-19 DIAGNOSIS — K210 Gastro-esophageal reflux disease with esophagitis: Secondary | ICD-10-CM | POA: Diagnosis not present

## 2017-05-19 DIAGNOSIS — N183 Chronic kidney disease, stage 3 (moderate): Secondary | ICD-10-CM | POA: Diagnosis not present

## 2017-05-19 DIAGNOSIS — E7800 Pure hypercholesterolemia, unspecified: Secondary | ICD-10-CM | POA: Diagnosis not present

## 2017-05-20 ENCOUNTER — Ambulatory Visit (INDEPENDENT_AMBULATORY_CARE_PROVIDER_SITE_OTHER): Payer: Medicare Other

## 2017-05-20 ENCOUNTER — Ambulatory Visit (INDEPENDENT_AMBULATORY_CARE_PROVIDER_SITE_OTHER): Payer: Medicare Other | Admitting: Orthopaedic Surgery

## 2017-05-20 ENCOUNTER — Telehealth (INDEPENDENT_AMBULATORY_CARE_PROVIDER_SITE_OTHER): Payer: Self-pay | Admitting: Physical Medicine and Rehabilitation

## 2017-05-20 DIAGNOSIS — Z96651 Presence of right artificial knee joint: Secondary | ICD-10-CM | POA: Diagnosis not present

## 2017-05-20 DIAGNOSIS — M1712 Unilateral primary osteoarthritis, left knee: Secondary | ICD-10-CM

## 2017-05-20 DIAGNOSIS — G8929 Other chronic pain: Secondary | ICD-10-CM | POA: Diagnosis not present

## 2017-05-20 DIAGNOSIS — M25562 Pain in left knee: Secondary | ICD-10-CM

## 2017-05-20 MED ORDER — LIDOCAINE HCL 1 % IJ SOLN
3.0000 mL | INTRAMUSCULAR | Status: AC | PRN
  Administered 2017-05-20: 3 mL

## 2017-05-20 MED ORDER — METHYLPREDNISOLONE ACETATE 40 MG/ML IJ SUSP
40.0000 mg | INTRAMUSCULAR | Status: AC | PRN
  Administered 2017-05-20: 40 mg via INTRA_ARTICULAR

## 2017-05-20 NOTE — Telephone Encounter (Signed)
Yes ok 

## 2017-05-20 NOTE — Progress Notes (Signed)
Office Visit Note   Patient: Joshua Strong           Date of Birth: 1930/12/29           MRN: [id number] Visit Date: 05/20/2017              Requested by: Sasser, Silvestre Moment, MD [place], [place] [address] PCP: [name], MD   Assessment & Plan: Visit Diagnoses:  1. Presence of right artificial knee joint   2. Chronic pain of left knee   3. Unilateral primary osteoarthritis, left knee     Plan: I do feel that he would benefit from a steroid injection in his left knee and I talked about also trying hyaluronic acid injection in a month and that knee. Also recommended a cane if he is a fall risk as well as significant and extensive clot strengthening exercises. All questions were encouraged and answered. We'll see him back in 4 weeks to hopefully place a hyaluronic acid injection into his left knee.  Follow-Up Instructions: Return in about 4 weeks (around 06/17/2017).   Orders:  Orders Placed This Encounter  Procedures  . Large Joint Injection/Arthrocentesis  . XR KNEE 3 VIEW RIGHT   No orders of the defined types were placed in this encounter.     Procedures: Large Joint Inj Date/Time: 05/20/2017 8:51 AM Performed by: Mcarthur Rossetti Authorized by: Mcarthur Rossetti   Location:  Knee Site:  L knee Ultrasound Guidance: No   Fluoroscopic Guidance: No   Arthrogram: No   Medications:  3 mL lidocaine 1 %; 40 mg methylPREDNISolone acetate 40 MG/ML     Clinical Data: No additional findings.   Subjective: Chief Complaint  Patient presents with  . Follow-up    RT TKA on 05/24/16  The patient is 1 year follow-up for right total knee arthroplasty. She's been having left knee pain on the medial joint line area. He is also had a recent fall landing on that right knee. He's had a lot of falls recently and now he is going to the gym to work on getting his quad muscles stronger. He denies any swelling in his right knee.  HPI  Review of Systems He denies  any headache, chest pain, sore breath, fever, chills, nausea, line.  Objective: Vital Signs: There were no vitals taken for this visit.  Physical Exam He is alert or 3 and in no acute distress Ortho Exam Examination of his left knee shows medial joint line tenderness and mild varus alignment good range of motion is excellent with no effusion. His right knee shows no effusion and well-healed surgical incision. The knee feels ligamentously stable and has good range of motion. Specialty Comments:  No specialty comments available.  Imaging: Xr Knee 3 View Right  Result Date: 05/20/2017 An AP and lateral of the right knee shows a well-seated total knee arthroplasty with no complicating features and no effusion. There is no acute findings. He can see the AP of his left knee and it does show moderate medial compartment arthritic changes and a mild varus malalignment.    PMFS History: Patient Active Problem List   Diagnosis Date Noted  . Presence of right artificial knee joint 05/20/2017  . Unilateral primary osteoarthritis, left knee 05/20/2017  . History of total right knee replacement 02/17/2017  . Chronic pain of left knee 02/17/2017  . Osteoarthritis of right knee 05/24/2016  . Status post total right knee replacement 05/24/2016  .  Diverticulosis of colon without hemorrhage   . CONSTIPATION 04/19/2010  . ABDOMINAL PAIN, LEFT LOWER QUADRANT 09/18/2009  . EPIGASTRIC PAIN 09/18/2009  . CARCINOMA, RENAL CELL 08/08/2008  . PNEUMONIA 08/08/2008  . ACUTE PANCREATITIS 08/08/2008  . NAUSEA 08/08/2008  . ABDOMINAL PAIN, CHRONIC 08/08/2008   Past Medical History:  Diagnosis Date  . GERD (gastroesophageal reflux disease)   . GI bleed    ulcer  . Hemorrhoid   . Pancreatitis   . Pneumonia   . Renal cell carcinoma ([place])   . Tubular adenoma   . Tubular adenoma 2013    Family History  Problem Relation Age of Onset  . Adopted: Yes    Past Surgical History:  Procedure Laterality  Date  . CHOLECYSTECTOMY    . COLONOSCOPY  06/16/2007   Dr. Gala Romney- minimal internal hemorrhoids o/w normal rectum,tubular adenoma  . COLONOSCOPY  07/02/2012   Dr.Rourk- normal rectum, 3 5-7 mm peduncuated polyos, one in the mid sigmoid segment and 2 in the ascending segment. remainder of the colonic mucosa appeared normal. bx= tubular adenoma  . COLONOSCOPY N/A 07/17/2015   Procedure: COLONOSCOPY;  Surgeon: Daneil Dolin, MD;  [facility];  Service: Endoscopy;  Laterality: N/A;  730   . EYE SURGERY     cataract with lens implants  . HAND SURGERY    . KNEE SURGERY     right  . NEPHRECTOMY Right   . pancreatic duct stent    . SHOULDER SURGERY     left  . SPINAL FUSION    . testicular tumor    . TOTAL KNEE ARTHROPLASTY Right 05/24/2016   Procedure: RIGHT TOTAL KNEE ARTHROPLASTY;  Surgeon: Mcarthur Rossetti, MD;  Location: [facility];  Service: Orthopedics;  Laterality: Right;   Social History   Occupational History  . Not on file.   Social History Main Topics  . Smoking status: Former Smoker    Packs/day: 0.50    Years: 3.00    Types: Cigarettes    Quit date: 05/17/1956  . Smokeless tobacco: Never Used     Comment: quit in 1958  . Alcohol use No  . Drug use: No  . Sexual activity: Not on file

## 2017-05-20 NOTE — Telephone Encounter (Signed)
Patent would like for you to call him to make an appointment.  CB [phone].  Thank you.

## 2017-05-20 NOTE — Telephone Encounter (Signed)
I called the patient and he would like to schedule another injection. His last was a bilateral L4 TF 09/09/16, which helped quite a bit. His pain is in the same area as before. Ok to repeat?

## 2017-05-21 DIAGNOSIS — R296 Repeated falls: Secondary | ICD-10-CM | POA: Diagnosis not present

## 2017-05-21 DIAGNOSIS — N183 Chronic kidney disease, stage 3 (moderate): Secondary | ICD-10-CM | POA: Diagnosis not present

## 2017-05-21 DIAGNOSIS — Z6827 Body mass index (BMI) 27.0-27.9, adult: Secondary | ICD-10-CM | POA: Diagnosis not present

## 2017-05-21 DIAGNOSIS — E782 Mixed hyperlipidemia: Secondary | ICD-10-CM | POA: Diagnosis not present

## 2017-05-21 DIAGNOSIS — R7301 Impaired fasting glucose: Secondary | ICD-10-CM | POA: Diagnosis not present

## 2017-05-21 DIAGNOSIS — R51 Headache: Secondary | ICD-10-CM | POA: Diagnosis not present

## 2017-05-21 DIAGNOSIS — Z1389 Encounter for screening for other disorder: Secondary | ICD-10-CM | POA: Diagnosis not present

## 2017-05-21 DIAGNOSIS — R5383 Other fatigue: Secondary | ICD-10-CM | POA: Diagnosis not present

## 2017-05-21 NOTE — Telephone Encounter (Signed)
Scheduled for 06/03/17 at 1415.

## 2017-05-28 ENCOUNTER — Telehealth (INDEPENDENT_AMBULATORY_CARE_PROVIDER_SITE_OTHER): Payer: Self-pay | Admitting: Physical Medicine and Rehabilitation

## 2017-05-28 MED ORDER — DIAZEPAM 5 MG PO TABS: ORAL_TABLET | ORAL | 0 refills | Status: DC

## 2017-05-28 NOTE — Telephone Encounter (Signed)
Faxed to patient's preferred pharmacy.

## 2017-05-28 NOTE — Telephone Encounter (Signed)
Valium printed.  

## 2017-05-28 NOTE — Telephone Encounter (Signed)
Re-procedure valium rx

## 2017-06-03 ENCOUNTER — Encounter (INDEPENDENT_AMBULATORY_CARE_PROVIDER_SITE_OTHER): Payer: Self-pay | Admitting: Physical Medicine and Rehabilitation

## 2017-06-03 ENCOUNTER — Ambulatory Visit (INDEPENDENT_AMBULATORY_CARE_PROVIDER_SITE_OTHER): Payer: Self-pay

## 2017-06-03 ENCOUNTER — Ambulatory Visit (INDEPENDENT_AMBULATORY_CARE_PROVIDER_SITE_OTHER): Payer: Medicare Other | Admitting: Physical Medicine and Rehabilitation

## 2017-06-03 VITALS — BP 144/78 | HR 58 | Temp 98.2°F

## 2017-06-03 DIAGNOSIS — M48062 Spinal stenosis, lumbar region with neurogenic claudication: Secondary | ICD-10-CM

## 2017-06-03 DIAGNOSIS — M5416 Radiculopathy, lumbar region: Secondary | ICD-10-CM

## 2017-06-03 MED ORDER — LIDOCAINE HCL (PF) 1 % IJ SOLN
2.0000 mL | Freq: Once | INTRAMUSCULAR | Status: AC
  Administered 2017-06-03: 2 mL

## 2017-06-03 MED ORDER — BETAMETHASONE SOD PHOS & ACET 6 (3-3) MG/ML IJ SUSP
12.0000 mg | Freq: Once | INTRAMUSCULAR | Status: AC
  Administered 2017-06-03: 12 mg

## 2017-06-03 NOTE — Patient Instructions (Signed)

## 2017-06-03 NOTE — Progress Notes (Deleted)
Pain across lower back. Left side is worse as of the past week. Radiates down both legs to calf. Numbness and tingling in left foot occasionally. Pain is worse after sitting long periods and goes to get up.

## 2017-06-06 NOTE — Procedures (Signed)
Joshua Strong is an 83 year old gentleman with multiple medical problems but is followed by Dr. Ninfa Linden for his orthopedic care in our office. 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 He's had an MRI in 2015 showing this. Her last injection was in December and he did quite well up until recently. He talks about the left side being a little bit worse with some radiating symptoms down to both calves. He gets numbness and tingling in the left foot occasionally. He does have a diagnosis of peripheral polyneuropathy. As in the past I think some of his back pain is facet mediated he has pain going from sit to stand. He's had no new trauma otherwise. I think repeating the L4 transforaminal injection is worthwhile is always seem to give him relief. I told him if he doesn't get as much relief as he liked from the low back pain itself that we could look at facet joint blocks. Lumbosacral Transforaminal Epidural Steroid Injection - Sub-Pedicular Approach with Fluoroscopic Guidance  Patient: Joshua Strong      Date of Birth: April 15, 1931 MRN: [id number] PCP: [name], MD      Visit Date: 06/03/2017   Universal Protocol:    Date/Time: 06/03/2017  Consent Given By: the patient  Position: PRONE  Additional Comments: Vital signs were monitored before and after the procedure. Patient was prepped and draped in the usual sterile fashion. The correct patient, procedure, and site was verified.   Injection Procedure Details:  Procedure Site One Meds Administered:  Meds ordered this encounter  Medications  . lidocaine (PF) (XYLOCAINE) 1 % injection 2 mL  . betamethasone acetate-betamethasone sodium phosphate (CELESTONE) injection 12 mg    Laterality: Bilateral  Location/Site:  L4-L5  Needle size: 22 G  Needle type: Spinal  Needle Placement:  Transforaminal  Findings:  -Contrast Used: 0.5 mL iohexol 180 mg iodine/mL   -Comments: Excellent flow of contrast along the nerve and into the epidural space.  Procedure Details: After squaring off the end-plates to get a true AP view, the C-arm was positioned so that an oblique view of the foramen as noted above was visualized. The target area is just inferior to the "nose of the scotty dog" or sub pedicular. The soft tissues overlying this structure were infiltrated with 2-3 ml. of 1% Lidocaine without Epinephrine.  The spinal needle was inserted toward the target using a "trajectory" view along the fluoroscope beam.  Under AP and lateral visualization, the needle was advanced so it did not puncture dura and was located close the 6 O'Clock position of the pedical in AP tracterory. Biplanar projections were used to confirm position. Aspiration was confirmed to be negative for CSF and/or blood. A 1-2 ml. volume of Isovue-250 was injected and flow of contrast was noted at each level. Radiographs were obtained for documentation purposes.   After attaining the desired flow of contrast documented above, a 0.5 to 1.0 ml test dose of 0.25% Marcaine was injected into each respective transforaminal space.  The patient was observed for 90 seconds post injection.  After no sensory deficits were reported, and normal lower extremity motor function was noted,   the above injectate was administered so that equal amounts of the injectate were placed at each foramen (level) into the transforaminal epidural space.   Additional Comments:  The patient tolerated the procedure well Dressing: Band-Aid    Post-procedure  details: Patient was observed during the procedure. Post-procedure instructions were reviewed.  Patient left the clinic in stable condition.

## 2017-06-19 DIAGNOSIS — I951 Orthostatic hypotension: Secondary | ICD-10-CM | POA: Diagnosis not present

## 2017-06-19 DIAGNOSIS — R5383 Other fatigue: Secondary | ICD-10-CM | POA: Diagnosis not present

## 2017-06-19 DIAGNOSIS — R42 Dizziness and giddiness: Secondary | ICD-10-CM | POA: Diagnosis not present

## 2017-06-19 DIAGNOSIS — N183 Chronic kidney disease, stage 3 (moderate): Secondary | ICD-10-CM | POA: Diagnosis not present

## 2017-06-19 DIAGNOSIS — Z6827 Body mass index (BMI) 27.0-27.9, adult: Secondary | ICD-10-CM | POA: Diagnosis not present

## 2017-06-23 ENCOUNTER — Ambulatory Visit (INDEPENDENT_AMBULATORY_CARE_PROVIDER_SITE_OTHER): Payer: Medicare Other | Admitting: Orthopaedic Surgery

## 2017-06-23 DIAGNOSIS — M1712 Unilateral primary osteoarthritis, left knee: Secondary | ICD-10-CM

## 2017-06-23 DIAGNOSIS — G8929 Other chronic pain: Secondary | ICD-10-CM

## 2017-06-23 DIAGNOSIS — M25562 Pain in left knee: Principal | ICD-10-CM

## 2017-06-23 MED ORDER — HYALURONAN 88 MG/4ML IX SOSY
88.0000 mg | PREFILLED_SYRINGE | INTRA_ARTICULAR | Status: AC | PRN
Start: 2017-06-23 — End: 2017-06-23
  Administered 2017-06-23: 88 mg via INTRA_ARTICULAR

## 2017-06-23 NOTE — Progress Notes (Signed)
   Procedure Note  Patient: Joshua Strong             Date of Birth: Jun 27, 1931           MRN: [id number]             Visit Date: 06/23/2017  Procedures: Visit Diagnoses: Chronic pain of left knee  Unilateral primary osteoarthritis, left knee  Large Joint Inj Date/Time: 06/23/2017 8:22 AM Performed by: Mcarthur Rossetti Authorized by: Mcarthur Rossetti   Location:  Knee Site:  L knee Ultrasound Guidance: No   Fluoroscopic Guidance: No   Arthrogram: No   Medications:  88 mg Hyaluronan 88 MG/4ML   The patient is here today for scheduled hyaluronic acid injection in his left knee to treat osteoarthritic pain. He's had injections like this before. He has a history of a right total knee replacement did last year. He has lateral pain on this left knee. He understands fully the risks and benefits of these injections and while doing as well. Examination of his left knee shows good range of motion overall no effusion. We long discussion about quad training exercises well. He tolerated the injection well. He'll follow-up as needed. He understands he can always get a steroid injection several months from now if needed.

## 2017-07-14 DIAGNOSIS — Z23 Encounter for immunization: Secondary | ICD-10-CM | POA: Diagnosis not present

## 2017-08-26 DIAGNOSIS — L299 Pruritus, unspecified: Secondary | ICD-10-CM | POA: Diagnosis not present

## 2017-08-26 DIAGNOSIS — L853 Xerosis cutis: Secondary | ICD-10-CM | POA: Diagnosis not present

## 2017-08-26 DIAGNOSIS — L570 Actinic keratosis: Secondary | ICD-10-CM | POA: Diagnosis not present

## 2017-08-28 ENCOUNTER — Telehealth (INDEPENDENT_AMBULATORY_CARE_PROVIDER_SITE_OTHER): Payer: Self-pay | Admitting: Physical Medicine and Rehabilitation

## 2017-08-28 ENCOUNTER — Other Ambulatory Visit (INDEPENDENT_AMBULATORY_CARE_PROVIDER_SITE_OTHER): Payer: Self-pay | Admitting: Physical Medicine and Rehabilitation

## 2017-08-28 MED ORDER — TRAMADOL HCL 50 MG PO TABS: 50.0000 mg | ORAL_TABLET | Freq: Three times a day (TID) | ORAL | 0 refills | Status: DC | PRN

## 2017-08-28 NOTE — Telephone Encounter (Signed)
Tramadol printed, not sure if tolerates but we'll see

## 2017-08-28 NOTE — Telephone Encounter (Signed)
Scheduled for 09/10/17. Patient asked about medication to help with his pain until then. [facility] [place].

## 2017-08-28 NOTE — Telephone Encounter (Signed)
ok 

## 2017-08-28 NOTE — Telephone Encounter (Signed)
Faxed to patient's pharmacy and patient notified.

## 2017-09-09 DIAGNOSIS — R05 Cough: Secondary | ICD-10-CM | POA: Diagnosis not present

## 2017-09-09 DIAGNOSIS — Z6828 Body mass index (BMI) 28.0-28.9, adult: Secondary | ICD-10-CM | POA: Diagnosis not present

## 2017-09-09 DIAGNOSIS — J209 Acute bronchitis, unspecified: Secondary | ICD-10-CM | POA: Diagnosis not present

## 2017-09-09 DIAGNOSIS — J0130 Acute sphenoidal sinusitis, unspecified: Secondary | ICD-10-CM | POA: Diagnosis not present

## 2017-09-09 IMAGING — DX DG KNEE 1-2V PORT*R*
2 series · 2 of 2 positions shown · non-contrast
Comparison: None.

CLINICAL DATA: Status post right total knee arthroplasty

EXAM:
PORTABLE RIGHT KNEE - 1-2 VIEW

[knee lat]
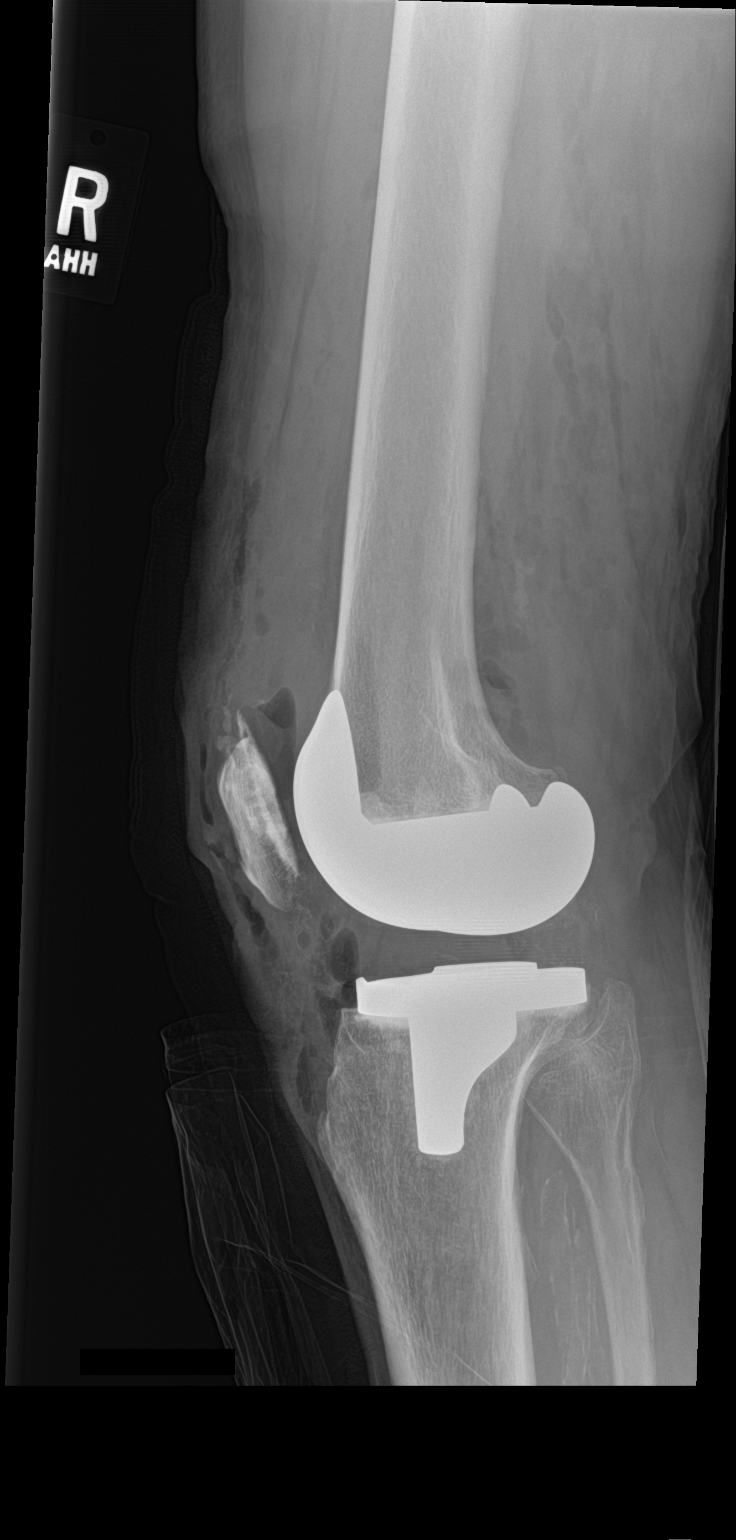

[knee ap]
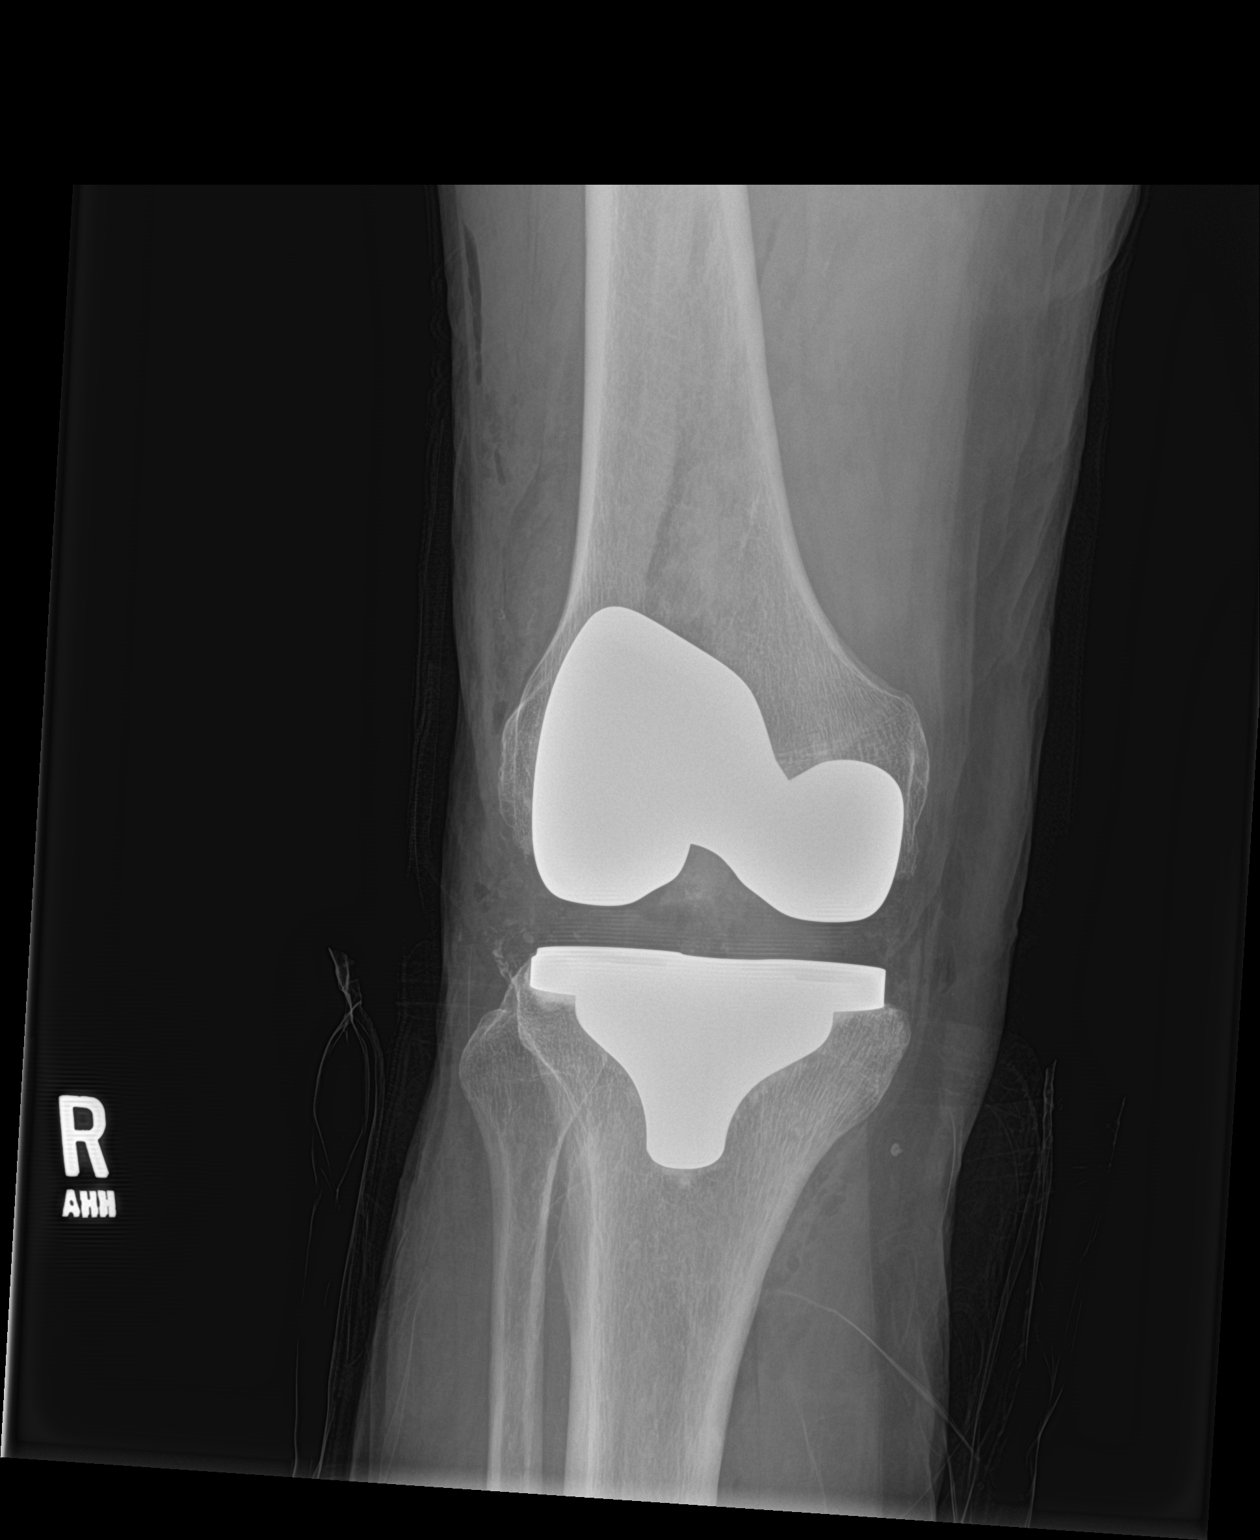

[2 of 2 positions shown; findings below may reference images not displayed]

FINDINGS: Postoperative appearance of the right knee compatible with total
arthroplasty. The hardware components are in anatomic alignment. Gas
is identified within the surrounding soft tissues. There is no
periprosthetic fracture or dislocation.
IMPRESSION: 1. Status post right total knee arthroplasty.  No complications.

## 2017-09-10 ENCOUNTER — Ambulatory Visit (INDEPENDENT_AMBULATORY_CARE_PROVIDER_SITE_OTHER): Payer: Medicare Other | Admitting: Physical Medicine and Rehabilitation

## 2017-09-10 ENCOUNTER — Ambulatory Visit (INDEPENDENT_AMBULATORY_CARE_PROVIDER_SITE_OTHER): Payer: Medicare Other

## 2017-09-10 ENCOUNTER — Encounter (INDEPENDENT_AMBULATORY_CARE_PROVIDER_SITE_OTHER): Payer: Self-pay | Admitting: Physical Medicine and Rehabilitation

## 2017-09-10 VITALS — BP 151/81 | HR 63 | Temp 97.8°F

## 2017-09-10 DIAGNOSIS — M48062 Spinal stenosis, lumbar region with neurogenic claudication: Secondary | ICD-10-CM | POA: Diagnosis not present

## 2017-09-10 DIAGNOSIS — M5416 Radiculopathy, lumbar region: Secondary | ICD-10-CM

## 2017-09-10 DIAGNOSIS — M79672 Pain in left foot: Secondary | ICD-10-CM

## 2017-09-10 DIAGNOSIS — M79671 Pain in right foot: Secondary | ICD-10-CM

## 2017-09-10 DIAGNOSIS — R269 Unspecified abnormalities of gait and mobility: Secondary | ICD-10-CM | POA: Diagnosis not present

## 2017-09-10 MED ORDER — METHYLPREDNISOLONE ACETATE 80 MG/ML IJ SUSP
80.0000 mg | Freq: Once | INTRAMUSCULAR | Status: AC
  Administered 2017-09-10: 80 mg

## 2017-09-10 MED ORDER — LIDOCAINE HCL (PF) 1 % IJ SOLN
2.0000 mL | Freq: Once | INTRAMUSCULAR | Status: AC
  Administered 2017-09-10: 2 mL

## 2017-09-10 NOTE — Progress Notes (Deleted)
Started on antibiotic yesterday for bronchitis. Mostly right sided pain radiating down leg to knee.

## 2017-09-10 NOTE — Patient Instructions (Signed)

## 2017-09-16 NOTE — Progress Notes (Signed)
Joshua Strong - 83 y.o. male MRN [id number]  Date of birth: 1931/09/26  Office Visit Note: Visit Date: 09/10/2017 PCP: [name], MD Referred by: Sasser, Silvestre Moment, MD  Subjective: Chief Complaint  Patient presents with  . Lower Back - Pain   HPI: Joshua Strong is an 83 year old gentleman that I have seen over the last several years.  He has stenosis at L4-5 with MRI from a few years ago.  He has done well with intermittent bilateral L4 transforaminal injections.  The last time I saw him was in September of this year bleeding injection with decent relief of his symptoms.  He unfortunately has had several medical issues.  He has had a history of renal carcinoma.  He has had his right knee replaced in August of last year.  He reports wanting to get his other knee replaced.  He sees Dr. Ninfa Linden in our office for his orthopedic care.  He also sees orthopedics at [facility] for osteoporosis.  He is on Forteo.  He had a bout of pancreatitis this year.  He was just started on some antibiotics for bronchitis.  He reports no falls recently.  He has had a history of balance difficulties.  He has had no focal weakness or bowel or bladder changes.  Pain is really a little bit more low back and left radicular pain at this point more than right.  He has a secondary issue today which is briefly discussed and evaluated.  He has some bilateral foot pain and difficulty ambulating.  He recently was seen at the foot store is looking at custom inserts for his shoes.  He indicates that when inserts were placed into his shoes he was standing more upright.  They wanted him to pay $800 for these inserts.  He reports no real issue with plantar fasciitis.  He reports more back and leg pain than foot pain.  He has had no tingling or numbness in the feet.  No foot drop or weakness.  No swelling.    Review of Systems  Constitutional: Negative for chills, fever, malaise/fatigue and weight loss.  HENT: Negative for hearing loss  and sinus pain.   Eyes: Negative for blurred vision, double vision and photophobia.  Respiratory: Negative for cough and shortness of breath.   Cardiovascular: Negative for chest pain, palpitations and leg swelling.  Gastrointestinal: Negative for abdominal pain, nausea and vomiting.  Genitourinary: Negative for flank pain.  Musculoskeletal: Positive for back pain and joint pain. Negative for myalgias.  Skin: Negative for itching and rash.  Neurological: Negative for tremors, focal weakness and weakness.  Endo/Heme/Allergies: Negative.   Psychiatric/Behavioral: Negative for depression.  All other systems reviewed and are negative.  Otherwise per HPI.  Assessment & Plan: Visit Diagnoses:  1. Lumbar radiculopathy   2. Spinal stenosis of lumbar region with neurogenic claudication   3. Bilateral foot pain   4. Gait abnormality     Plan: Findings:  History of lumbar stenosis and prior lumbar laminectomy.  He has had worsening symptoms over the last couple of weeks or so with consistent left more than right radicular pain and pain with standing and ambulating.  He has had physical therapy in the past and medication management.  He does use mild pain medication.  He has had his knee replaced and is looking at replacing the other knee.  He did have some memory difficulties after the last surgery.  He was seen by neurology for this.  He  is also followed for osteoporosis.  Epidural injections need to be looked at obviously in terms of numbers going along with his osteoporosis.  See him very infrequently.  We are probably completing only about 3 injections/year.  His MRI is from a few years ago in 2015.  It may be worth looking at a repeat of this over the next year or so.  This would give [place] a baseline to see if he has had any worsening stenosis.  At 83 he may have at the level that he is got have.  He tries to remain active.  He has used some bracing in the past and will continue to do that on an  intermittent basis.  Injection was completed today.  In terms of shoe inserts there is really not a great amount of good data in terms of should use custom inserts or over-the-counter inserts and how this would affect some months back pain.  Obviously would like to think if you walk with better posture than overall he may get a better result in terms of your back pain over time.  I am not so sure that having inserts that make him walk more erect is going to do much for his back because this may actually give him increased pain with a level of arthritis that he has this might throw him into extension.  I think it would be wise to try over-the-counter inserts as a trial that would probably cost $50 or less.  These could be obtained at [employer] or [employer].  If they could somehow try the custom inserts obviously it would be nice to be able to walk around in these and see how much they were going to help.  Inversely he could see a different foot and ankle specialist such as Dr. Dr. Sharol Given in our office for evaluation.    Meds & Orders:  Meds ordered this encounter  Medications  . lidocaine (PF) (XYLOCAINE) 1 % injection 2 mL  . methylPREDNISolone acetate (DEPO-MEDROL) injection 80 mg    Orders Placed This Encounter  Procedures  . XR C-ARM NO REPORT  . Epidural Steroid injection    Follow-up: Return if symptoms worsen or fail to improve.   Procedures: No procedures performed  Lumbosacral Transforaminal Epidural Steroid Injection - Sub-Pedicular Approach with Fluoroscopic Guidance  Patient: Joshua Strong      Date of Birth: [birth year] MRN: [id number] PCP: [name], MD      Visit Date: 09/10/2017   Universal Protocol:    Date/Time: 09/10/2017  Consent Given By: the patient  Position: PRONE  Additional Comments: Vital signs were monitored before and after the procedure. Patient was prepped and draped in the usual sterile fashion. The correct patient, procedure, and site was  verified.   Injection Procedure Details:  Procedure Site One Meds Administered:  Meds ordered this encounter  Medications  . lidocaine (PF) (XYLOCAINE) 1 % injection 2 mL  . methylPREDNISolone acetate (DEPO-MEDROL) injection 80 mg    Laterality: Bilateral  Location/Site:  L4-L5  Needle size: 22 G  Needle type: Spinal  Needle Placement: Transforaminal  Findings:    -Comments: Excellent flow of contrast along the nerve and into the epidural space.  Procedure Details: After squaring off the end-plates to get a true AP view, the C-arm was positioned so that an oblique view of the foramen as noted above was visualized. The target area is just inferior to the "nose of the scotty dog" or sub pedicular.  The soft tissues overlying this structure were infiltrated with 2-3 ml. of 1% Lidocaine without Epinephrine.  The spinal needle was inserted toward the target using a "trajectory" view along the fluoroscope beam.  Under AP and lateral visualization, the needle was advanced so it did not puncture dura and was located close the 6 O'Clock position of the pedical in AP tracterory. Biplanar projections were used to confirm position. Aspiration was confirmed to be negative for CSF and/or blood. A 1-2 ml. volume of Isovue-250 was injected and flow of contrast was noted at each level. Radiographs were obtained for documentation purposes.   After attaining the desired flow of contrast documented above, a 0.5 to 1.0 ml test dose of 0.25% Marcaine was injected into each respective transforaminal space.  The patient was observed for 90 seconds post injection.  After no sensory deficits were reported, and normal lower extremity motor function was noted,   the above injectate was administered so that equal amounts of the injectate were placed at each foramen (level) into the transforaminal epidural space.   Additional Comments:  The patient tolerated the procedure well Dressing: Band-Aid     Post-procedure details: Patient was observed during the procedure. Post-procedure instructions were reviewed.  Patient left the clinic in stable condition.   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 At L2-3 with mild to moderate central narrowing with left-sided lateral recess narrowing. At L4-5 there is moderate multifactorial stenosis and facet arthropathy with lateral recess narrowing. At L5-S1 he is status post laminectomy with bulky fibrosis but otherwise open canal.     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 At L2-3 with mild to moderate central narrowing with left-sided lateral recess narrowing. At L4-5 there is moderate multifactorial stenosis and facet arthropathy with lateral recess narrowing. At L5-S1 he is status post laminectomy with bulky fibrosis but otherwise open canal.  He reports that he quit smoking about 61 years ago. His smoking use included cigarettes. He has a 1.50 pack-year smoking history. he has never used smokeless tobacco. No results for input(s): HGBA1C, LABURIC in the last 8760 hours.  Objective:  VS:  HT:    WT:   BMI:     BP:(!) 151/81  HR:63bpm  TEMP:97.8 F (36.6 C)(Oral)  RESP:  Physical Exam  Constitutional: He is oriented to person, place, and time. He appears well-developed and well-nourished. No distress.  HENT:  Head: Normocephalic and atraumatic.  Eyes: Conjunctivae are normal. Pupils are equal, round, and reactive to light.  Neck: Normal range of motion. Neck supple.  Cardiovascular: Regular rhythm and intact distal pulses.  Pulmonary/Chest: Effort normal. No respiratory distress.  Abdominal: Soft.  Musculoskeletal:  The patient ambulates without aid with a forward flexed spine with good distal strength.  He has no pain with hip rotation.  Examination of the feet shows  decent distal pulses.  He does not have pes planus.  Neurological: He is alert and oriented to person, place, and time. He exhibits normal muscle tone.  Skin: Skin is warm and dry. No rash noted. No erythema.  Psychiatric: He has a normal mood and affect.  Nursing note and vitals reviewed.   Ortho Exam Imaging: No results found.  Past Medical/Family/Surgical/Social History: Medications & Allergies reviewed per EMR Patient Active Problem List   Diagnosis Date Noted  . Presence of right artificial knee joint 05/20/2017  . Unilateral primary  osteoarthritis, left knee 05/20/2017  . History of total right knee replacement 02/17/2017  . Chronic pain of left knee 02/17/2017  . Osteoarthritis of right knee 05/24/2016  . Status post total right knee replacement 05/24/2016  . Diverticulosis of colon without hemorrhage   . CONSTIPATION 04/19/2010  . ABDOMINAL PAIN, LEFT LOWER QUADRANT 09/18/2009  . EPIGASTRIC PAIN 09/18/2009  . CARCINOMA, RENAL CELL 08/08/2008  . PNEUMONIA 08/08/2008  . ACUTE PANCREATITIS 08/08/2008  . NAUSEA 08/08/2008  . ABDOMINAL PAIN, CHRONIC 08/08/2008   Past Medical History:  Diagnosis Date  . GERD (gastroesophageal reflux disease)   . GI bleed    ulcer  . Hemorrhoid   . Pancreatitis   . Pneumonia   . Renal cell carcinoma ([place])   . Tubular adenoma   . Tubular adenoma 2013   Family History  Adopted: Yes   Past Surgical History:  Procedure Laterality Date  . CHOLECYSTECTOMY    . COLONOSCOPY  06/16/2007   Dr. Gala Romney- minimal internal hemorrhoids o/w normal rectum,tubular adenoma  . COLONOSCOPY  07/02/2012   Dr.Rourk- normal rectum, 3 5-7 mm peduncuated polyos, one in the mid sigmoid segment and 2 in the ascending segment. remainder of the colonic mucosa appeared normal. bx= tubular adenoma  . COLONOSCOPY N/A 07/17/2015   Procedure: COLONOSCOPY;  Surgeon: Daneil Dolin, MD;  Location: [facility];  Service: Endoscopy;  Laterality: N/A;  730   . EYE  SURGERY     cataract with lens implants  . HAND SURGERY    . KNEE SURGERY     right  . NEPHRECTOMY Right   . pancreatic duct stent    . SHOULDER SURGERY     left  . SPINAL FUSION    . testicular tumor    . TOTAL KNEE ARTHROPLASTY Right 05/24/2016   Procedure: RIGHT TOTAL KNEE ARTHROPLASTY;  Surgeon: Mcarthur Rossetti, MD;  Location: [facility];  Service: Orthopedics;  Laterality: Right;   Social History   Occupational History  . Not on file  Tobacco Use  . Smoking status: Former Smoker    Packs/day: 0.50    Years: 3.00    Pack years: 1.50    Types: Cigarettes    Last attempt to quit: 05/17/1956    Years since quitting: 61.3  . Smokeless tobacco: Never Used  . Tobacco comment: quit in 1958  Substance and Sexual Activity  . Alcohol use: No  . Drug use: No  . Sexual activity: Not on file

## 2017-09-16 NOTE — Procedures (Signed)
Lumbosacral Transforaminal Epidural Steroid Injection - Sub-Pedicular Approach with Fluoroscopic Guidance  Patient: Joshua Strong      Date of Birth: 07/01/1931 MRN: [id number] PCP: Joshua Hilding, MD      Visit Date: 09/10/2017   Universal Protocol:    Date/Time: 09/10/2017  Consent Given By: the patient  Position: PRONE  Additional Comments: Vital signs were monitored before and after the procedure. Patient was prepped and draped in the usual sterile fashion. The correct patient, procedure, and site was verified.   Injection Procedure Details:  Procedure Site One Meds Administered:  Meds ordered this encounter  Medications  . lidocaine (PF) (XYLOCAINE) 1 % injection 2 mL  . methylPREDNISolone acetate (DEPO-MEDROL) injection 80 mg    Laterality: Bilateral  Location/Site:  L4-L5  Needle size: 22 G  Needle type: Spinal  Needle Placement: Transforaminal  Findings:    -Comments: Excellent flow of contrast along the nerve and into the epidural space.  Procedure Details: After squaring off the end-plates to get a true AP view, the C-arm was positioned so that an oblique view of the foramen as noted above was visualized. The target area is just inferior to the "nose of the scotty dog" or sub pedicular. The soft tissues overlying this structure were infiltrated with 2-3 ml. of 1% Lidocaine without Epinephrine.  The spinal needle was inserted toward the target using a "trajectory" view along the fluoroscope beam.  Under AP and lateral visualization, the needle was advanced so it did not puncture dura and was located close the 6 O'Clock position of the pedical in AP tracterory. Biplanar projections were used to confirm position. Aspiration was confirmed to be negative for CSF and/or blood. A 1-2 ml. volume of Isovue-250 was injected and flow of contrast was noted at each level. Radiographs were obtained for documentation purposes.   After attaining the desired flow of contrast  documented above, a 0.5 to 1.0 ml test dose of 0.25% Marcaine was injected into each respective transforaminal space.  The patient was observed for 90 seconds post injection.  After no sensory deficits were reported, and normal lower extremity motor function was noted,   the above injectate was administered so that equal amounts of the injectate were placed at each foramen (level) into the transforaminal epidural space.   Additional Comments:  The patient tolerated the procedure well Dressing: Band-Aid    Post-procedure details: Patient was observed during the procedure. Post-procedure instructions were reviewed.  Patient left the clinic in stable condition.   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 At L2-3 with mild to moderate central narrowing with left-sided lateral recess narrowing. At L4-5 there is moderate multifactorial stenosis and facet arthropathy with lateral recess narrowing. At L5-S1 he is status post laminectomy with bulky fibrosis but otherwise open canal.

## 2017-09-18 ENCOUNTER — Telehealth (INDEPENDENT_AMBULATORY_CARE_PROVIDER_SITE_OTHER): Payer: Self-pay | Admitting: Orthopaedic Surgery

## 2017-09-18 NOTE — Telephone Encounter (Signed)
Patient would like to come in on Jan 9th for a gel injection in his knee.

## 2017-09-25 NOTE — Telephone Encounter (Signed)
Patient is medicare we can B&B

## 2017-10-08 ENCOUNTER — Ambulatory Visit (INDEPENDENT_AMBULATORY_CARE_PROVIDER_SITE_OTHER): Payer: Medicare Other | Admitting: Orthopaedic Surgery

## 2017-10-08 ENCOUNTER — Encounter (INDEPENDENT_AMBULATORY_CARE_PROVIDER_SITE_OTHER): Payer: Self-pay | Admitting: Orthopaedic Surgery

## 2017-10-08 DIAGNOSIS — G8929 Other chronic pain: Secondary | ICD-10-CM | POA: Diagnosis not present

## 2017-10-08 DIAGNOSIS — M25562 Pain in left knee: Secondary | ICD-10-CM

## 2017-10-08 MED ORDER — LIDOCAINE HCL 1 % IJ SOLN
3.0000 mL | INTRAMUSCULAR | Status: AC | PRN
  Administered 2017-10-08: 3 mL

## 2017-10-08 MED ORDER — METHYLPREDNISOLONE ACETATE 40 MG/ML IJ SUSP
40.0000 mg | INTRAMUSCULAR | Status: AC | PRN
  Administered 2017-10-08: 40 mg via INTRA_ARTICULAR

## 2017-10-08 NOTE — Progress Notes (Signed)
Office Visit Note   Patient: Joshua Strong           Date of Birth: [birth year]           MRN: [id number] Visit Date: 10/08/2017              Requested by: Sasser, Silvestre Moment, MD [place], [place] [address] PCP: [name], MD   Assessment & Plan: Visit Diagnoses:  1. Chronic pain of left knee     Plan: Per his wishes we provided a steroid injection in the steroid or hyaluronic acid in the spring if needed.  He will let us know. left knee that he tolerated well.  He knows he can always have another injection of either  Follow-Up Instructions: Return if symptoms worsen or fail to improve.   Orders:  No orders of the defined types were placed in this encounter.  No orders of the defined types were placed in this encounter.     Procedures: Large Joint Inj: L knee on 10/08/2017 2:47 PM Indications: diagnostic evaluation and pain Details: 22 G 1.5 in needle, superolateral approach  Arthrogram: No  Medications: 3 mL lidocaine 1 %; 40 mg methylPREDNISolone acetate 40 MG/ML Outcome: tolerated well, no immediate complications Procedure, treatment alternatives, risks and benefits explained, specific risks discussed. Consent was given by the patient. Immediately prior to procedure a time out was called to verify the correct patient, procedure, equipment, support staff and site/side marked as required. Patient was prepped and draped in the usual sterile fashion.       Clinical Data: No additional findings.   Subjective: Chief Complaint  Patient presents with  . Left Knee - Follow-up  Patient is following up on his left knee.  He has known osteoarthritis in that left knee.  He had a Monovisc hyaluronic acid injection in the knee in September.  He would like to have a steroid in it today.  He still not interested in any intervention in that knee.  We performed a right total knee arthroplasty on him 16 months ago.  Nothing else to change his medical status.  HPI  Review of  Systems He denies any headache, chest pain, shortness of breath, fever, chills, nausea, vomiting.  Objective: Vital Signs: There were no vitals taken for this visit.  Physical Exam He is alert and oriented x3 and in no acute distress Ortho Exam Examination of both knees show fluid movement of both knees.  His right knee knee seems to be doing well.  His left knee has pain on the medial joint line. Specialty Comments:  No specialty comments available.  Imaging: No results found.   PMFS History: Patient Active Problem List   Diagnosis Date Noted  . Presence of right artificial knee joint 05/20/2017  . Unilateral primary osteoarthritis, left knee 05/20/2017  . History of total right knee replacement 02/17/2017  . Chronic pain of left knee 02/17/2017  . Osteoarthritis of right knee 05/24/2016  . Status post total right knee replacement 05/24/2016  . Diverticulosis of colon without hemorrhage   . CONSTIPATION 04/19/2010  . ABDOMINAL PAIN, LEFT LOWER QUADRANT 09/18/2009  . EPIGASTRIC PAIN 09/18/2009  . CARCINOMA, RENAL CELL 08/08/2008  . PNEUMONIA 08/08/2008  . ACUTE PANCREATITIS 08/08/2008  . NAUSEA 08/08/2008  . ABDOMINAL PAIN, CHRONIC 08/08/2008   Past Medical History:  Diagnosis Date  . GERD (gastroesophageal reflux disease)   . GI bleed    ulcer  . Hemorrhoid   .  Pancreatitis   . Pneumonia   . Renal cell carcinoma ([place])   . Tubular adenoma   . Tubular adenoma 2013    Family History  Adopted: Yes    Past Surgical History:  Procedure Laterality Date  . CHOLECYSTECTOMY    . COLONOSCOPY  06/16/2007   Dr. Gala Romney- minimal internal hemorrhoids o/w normal rectum,tubular adenoma  . COLONOSCOPY  07/02/2012   Dr.Rourk- normal rectum, 3 5-7 mm peduncuated polyos, one in the mid sigmoid segment and 2 in the ascending segment. remainder of the colonic mucosa appeared normal. bx= tubular adenoma  . COLONOSCOPY N/A 07/17/2015   Procedure: COLONOSCOPY;  Surgeon: Daneil Dolin,  MD;  Location: [facility];  Service: Endoscopy;  Laterality: N/A;  730   . EYE SURGERY     cataract with lens implants  . HAND SURGERY    . KNEE SURGERY     right  . NEPHRECTOMY Right   . pancreatic duct stent    . SHOULDER SURGERY     left  . SPINAL FUSION    . testicular tumor    . TOTAL KNEE ARTHROPLASTY Right 05/24/2016   Procedure: RIGHT TOTAL KNEE ARTHROPLASTY;  Surgeon: Mcarthur Rossetti, MD;  Location: [facility];  Service: Orthopedics;  Laterality: Right;   Social History   Occupational History  . Not on file  Tobacco Use  . Smoking status: Former Smoker    Packs/day: 0.50    Years: 3.00    Pack years: 1.50    Types: Cigarettes    Last attempt to quit: 05/17/1956    Years since quitting: 61.4  . Smokeless tobacco: Never Used  . Tobacco comment: quit in 1958  Substance and Sexual Activity  . Alcohol use: No  . Drug use: No  . Sexual activity: Not on file

## 2017-10-13 DIAGNOSIS — K861 Other chronic pancreatitis: Secondary | ICD-10-CM | POA: Diagnosis not present

## 2017-10-13 DIAGNOSIS — Z8619 Personal history of other infectious and parasitic diseases: Secondary | ICD-10-CM | POA: Diagnosis not present

## 2017-10-13 DIAGNOSIS — K219 Gastro-esophageal reflux disease without esophagitis: Secondary | ICD-10-CM | POA: Diagnosis not present

## 2017-10-14 DIAGNOSIS — Z8619 Personal history of other infectious and parasitic diseases: Secondary | ICD-10-CM | POA: Diagnosis not present

## 2017-10-14 DIAGNOSIS — K861 Other chronic pancreatitis: Secondary | ICD-10-CM | POA: Diagnosis not present

## 2017-11-18 DIAGNOSIS — F331 Major depressive disorder, recurrent, moderate: Secondary | ICD-10-CM | POA: Diagnosis not present

## 2017-11-18 DIAGNOSIS — E782 Mixed hyperlipidemia: Secondary | ICD-10-CM | POA: Diagnosis not present

## 2017-11-18 DIAGNOSIS — N183 Chronic kidney disease, stage 3 (moderate): Secondary | ICD-10-CM | POA: Diagnosis not present

## 2017-11-18 DIAGNOSIS — E7800 Pure hypercholesterolemia, unspecified: Secondary | ICD-10-CM | POA: Diagnosis not present

## 2017-11-18 DIAGNOSIS — R7301 Impaired fasting glucose: Secondary | ICD-10-CM | POA: Diagnosis not present

## 2017-11-18 DIAGNOSIS — R5383 Other fatigue: Secondary | ICD-10-CM | POA: Diagnosis not present

## 2017-11-18 DIAGNOSIS — K210 Gastro-esophageal reflux disease with esophagitis: Secondary | ICD-10-CM | POA: Diagnosis not present

## 2017-11-20 DIAGNOSIS — Z6827 Body mass index (BMI) 27.0-27.9, adult: Secondary | ICD-10-CM | POA: Diagnosis not present

## 2017-11-20 DIAGNOSIS — R7301 Impaired fasting glucose: Secondary | ICD-10-CM | POA: Diagnosis not present

## 2017-11-20 DIAGNOSIS — R5383 Other fatigue: Secondary | ICD-10-CM | POA: Diagnosis not present

## 2017-11-20 DIAGNOSIS — R51 Headache: Secondary | ICD-10-CM | POA: Diagnosis not present

## 2017-11-20 DIAGNOSIS — N183 Chronic kidney disease, stage 3 (moderate): Secondary | ICD-10-CM | POA: Diagnosis not present

## 2017-11-20 DIAGNOSIS — R296 Repeated falls: Secondary | ICD-10-CM | POA: Diagnosis not present

## 2017-11-20 DIAGNOSIS — E782 Mixed hyperlipidemia: Secondary | ICD-10-CM | POA: Diagnosis not present

## 2017-11-28 ENCOUNTER — Other Ambulatory Visit (INDEPENDENT_AMBULATORY_CARE_PROVIDER_SITE_OTHER): Payer: Self-pay | Admitting: Physical Medicine and Rehabilitation

## 2017-11-28 ENCOUNTER — Telehealth (INDEPENDENT_AMBULATORY_CARE_PROVIDER_SITE_OTHER): Payer: Self-pay

## 2017-11-28 ENCOUNTER — Telehealth (INDEPENDENT_AMBULATORY_CARE_PROVIDER_SITE_OTHER): Payer: Self-pay | Admitting: Physical Medicine and Rehabilitation

## 2017-11-28 MED ORDER — TRAMADOL HCL 50 MG PO TABS: 50.0000 mg | ORAL_TABLET | Freq: Three times a day (TID) | ORAL | 0 refills | Status: DC | PRN

## 2017-11-28 NOTE — Progress Notes (Signed)
Tramadol renewed.

## 2017-11-28 NOTE — Telephone Encounter (Signed)
Scheduled for repeat. Rx faxed to pharmacy.

## 2017-11-28 NOTE — Telephone Encounter (Signed)
FYI:   Requests diagnosis for the Rx for Tramadol from Dr. Ernestina Patches and whether it is chronic or not. Advised he does need the medication for his chronic low back pain (spinal stenosis/lumbar radiculopathy).

## 2017-11-28 NOTE — Telephone Encounter (Signed)
If helped ok to repeat, tramadol printed, if no help then ov

## 2017-12-01 NOTE — Telephone Encounter (Signed)
Ok to say it is chronic and if pharmacy needs to they can send [place] something to confirm or we can rewrite the rx - the reason is that they can only fill certain amount if not chronic

## 2017-12-01 NOTE — Telephone Encounter (Signed)
Please advise 

## 2017-12-01 NOTE — Telephone Encounter (Signed)
Called the pharmacy and this had already been taken care of.

## 2017-12-04 ENCOUNTER — Telehealth (INDEPENDENT_AMBULATORY_CARE_PROVIDER_SITE_OTHER): Payer: Self-pay | Admitting: Orthopaedic Surgery

## 2017-12-04 NOTE — Telephone Encounter (Signed)
Patient called advised he is ready to get the Gel injection in his left knee. The number to contact patient is [phone]

## 2017-12-05 NOTE — Telephone Encounter (Signed)
See below

## 2017-12-09 NOTE — Telephone Encounter (Signed)
Submitted application online for Monovisc, left knee. 

## 2017-12-11 NOTE — Telephone Encounter (Signed)
Patient called to check on status of an appointment for an injection, I did tell him that it is a process, that it was submitted online for approval, and he will get a call once approved. He understood and was not aware of this. CB # [phone]

## 2017-12-11 NOTE — Telephone Encounter (Signed)
Noted. Thank You.

## 2017-12-16 ENCOUNTER — Telehealth (INDEPENDENT_AMBULATORY_CARE_PROVIDER_SITE_OTHER): Payer: Self-pay

## 2017-12-16 NOTE — Telephone Encounter (Signed)
Called and left message for patient to return my call to schedule appt.to have Monovisc injection for left knee.

## 2017-12-22 ENCOUNTER — Telehealth (INDEPENDENT_AMBULATORY_CARE_PROVIDER_SITE_OTHER): Payer: Self-pay | Admitting: Physical Medicine and Rehabilitation

## 2017-12-22 ENCOUNTER — Other Ambulatory Visit (INDEPENDENT_AMBULATORY_CARE_PROVIDER_SITE_OTHER): Payer: Self-pay | Admitting: Physical Medicine and Rehabilitation

## 2017-12-22 MED ORDER — DIAZEPAM 5 MG PO TABS: ORAL_TABLET | ORAL | 0 refills | Status: DC

## 2017-12-22 NOTE — Telephone Encounter (Signed)
Preprocedure Valium was sent to his pharmacy and I think it may have said [place].  Please ask him if that pharmacy is okay if not can try to change that.

## 2017-12-22 NOTE — Progress Notes (Signed)
Preprocedure Valium sent.

## 2017-12-22 NOTE — Telephone Encounter (Signed)
Called patient, and he said he could pick the prescription up in [place].

## 2017-12-23 ENCOUNTER — Ambulatory Visit (INDEPENDENT_AMBULATORY_CARE_PROVIDER_SITE_OTHER): Payer: Medicare Other | Admitting: Physical Medicine and Rehabilitation

## 2017-12-23 ENCOUNTER — Ambulatory Visit (INDEPENDENT_AMBULATORY_CARE_PROVIDER_SITE_OTHER): Payer: Medicare Other

## 2017-12-23 ENCOUNTER — Encounter (INDEPENDENT_AMBULATORY_CARE_PROVIDER_SITE_OTHER): Payer: Self-pay | Admitting: Physical Medicine and Rehabilitation

## 2017-12-23 VITALS — BP 144/84 | HR 59 | Temp 97.6°F

## 2017-12-23 DIAGNOSIS — M5416 Radiculopathy, lumbar region: Secondary | ICD-10-CM | POA: Diagnosis not present

## 2017-12-23 DIAGNOSIS — M48062 Spinal stenosis, lumbar region with neurogenic claudication: Secondary | ICD-10-CM | POA: Diagnosis not present

## 2017-12-23 MED ORDER — BETAMETHASONE SOD PHOS & ACET 6 (3-3) MG/ML IJ SUSP
12.0000 mg | Freq: Once | INTRAMUSCULAR | Status: AC
  Administered 2017-12-23: 12 mg

## 2017-12-23 NOTE — Progress Notes (Signed)
 .  Numeric Pain Rating Scale and Functional Assessment Average Pain 8   In the last MONTH (on 0-10 scale) has pain interfered with the following?  1. General activity like being  able to carry out your everyday physical activities such as walking, climbing stairs, carrying groceries, or moving a chair?  Rating(4)   +Driver, -BT, -Dye Allergies.  

## 2017-12-23 NOTE — Patient Instructions (Signed)

## 2017-12-24 ENCOUNTER — Encounter (INDEPENDENT_AMBULATORY_CARE_PROVIDER_SITE_OTHER): Payer: Self-pay | Admitting: Orthopaedic Surgery

## 2017-12-24 ENCOUNTER — Ambulatory Visit (INDEPENDENT_AMBULATORY_CARE_PROVIDER_SITE_OTHER): Payer: Medicare Other | Admitting: Orthopaedic Surgery

## 2017-12-24 DIAGNOSIS — M1712 Unilateral primary osteoarthritis, left knee: Secondary | ICD-10-CM

## 2017-12-24 MED ORDER — HYALURONAN 88 MG/4ML IX SOSY
88.0000 mg | PREFILLED_SYRINGE | INTRA_ARTICULAR | Status: AC | PRN
  Administered 2017-12-24: 88 mg via INTRA_ARTICULAR

## 2017-12-24 NOTE — Progress Notes (Signed)
   Procedure Note  Patient: Joshua Strong             Date of Birth: December 16, 1930           MRN: [id number]             Visit Date: 12/24/2017  Procedures: Visit Diagnoses: No diagnosis found.  Large Joint Inj: L knee on 12/24/2017 4:02 PM Indications: pain and diagnostic evaluation Details: 22 G 1.5 in needle, superolateral approach  Arthrogram: No  Medications: 88 mg Hyaluronan 88 MG/4ML Outcome: tolerated well, no immediate complications Procedure, treatment alternatives, risks and benefits explained, specific risks discussed. Consent was given by the patient. Immediately prior to procedure a time out was called to verify the correct patient, procedure, equipment, support staff and site/side marked as required. Patient was prepped and draped in the usual sterile fashion.    The patient is here today for scheduled Monovisc injection of hyaluronic acid in his left knee treat moderate arthritis pain.  He is tried other forms of conservative treatment including steroid injection and activity modification.  He is still recovering from a right total knee arthroplasty that is doing well.  His only problems going up and down stairs.  On examination of his left knee there is no effusion but there is varus malalignment.  There is pain with range of motion and pain to palpation of all 3 compartments.  He tolerated the Monovisc injection well in his left knee.  All questions concerns were answered and addressed.  Follow-up as needed at this point but he understands he can always have a steroid injection in about 3 months from now if needed and another hyaluronic acid injection at the earliest 6 months from now.

## 2017-12-31 NOTE — Procedures (Signed)
Lumbosacral Transforaminal Epidural Steroid Injection - Sub-Pedicular Approach with Fluoroscopic Guidance  Patient: Joshua Strong      Date of Birth: 1931/05/16 MRN: [id number] PCP: [name], MD      Visit Date: 12/23/2017   Universal Protocol:    Date/Time: 12/23/2017  Consent Given By: the patient  Position: PRONE  Additional Comments: Vital signs were monitored before and after the procedure. Patient was prepped and draped in the usual sterile fashion. The correct patient, procedure, and site was verified.   Injection Procedure Details:  Procedure Site One Meds Administered:  Meds ordered this encounter  Medications  . betamethasone acetate-betamethasone sodium phosphate (CELESTONE) injection 12 mg    Laterality: Bilateral  Location/Site:  L4-L5  Needle size: 22 G  Needle type: Spinal  Needle Placement: Transforaminal  Findings:    -Comments: Excellent flow of contrast along the nerve and into the epidural space.  Procedure Details: After squaring off the end-plates to get a true AP view, the C-arm was positioned so that an oblique view of the foramen as noted above was visualized. The target area is just inferior to the "nose of the scotty dog" or sub pedicular. The soft tissues overlying this structure were infiltrated with 2-3 ml. of 1% Lidocaine without Epinephrine.  The spinal needle was inserted toward the target using a "trajectory" view along the fluoroscope beam.  Under AP and lateral visualization, the needle was advanced so it did not puncture dura and was located close the 6 O'Clock position of the pedical in AP tracterory. Biplanar projections were used to confirm position. Aspiration was confirmed to be negative for CSF and/or blood. A 1-2 ml. volume of Isovue-250 was injected and flow of contrast was noted at each level. Radiographs were obtained for documentation purposes.   After attaining the desired flow of contrast documented above, a 0.5 to  1.0 ml test dose of 0.25% Marcaine was injected into each respective transforaminal space.  The patient was observed for 90 seconds post injection.  After no sensory deficits were reported, and normal lower extremity motor function was noted,   the above injectate was administered so that equal amounts of the injectate were placed at each foramen (level) into the transforaminal epidural space.   Additional Comments:  The patient tolerated the procedure well Dressing: Band-Aid    Post-procedure details: Patient was observed during the procedure. Post-procedure instructions were reviewed.  Patient left the clinic in stable condition.

## 2017-12-31 NOTE — Progress Notes (Signed)
Joshua Strong - 84 y.o. male MRN [id number]  Date of birth: [birth year]  Office Visit Note: Visit Date: 12/23/2017 PCP: [name], MD Referred by: Sasser, Silvestre Moment, MD  Subjective: Chief Complaint  Patient presents with  . Lower Back - Pain  . Right Leg - Pain  . Left Leg - Pain   HPI: Joshua Strong is a pleasant 84 year old gentleman that lives in [place] that we have seen over the years with known lumbar stenosis and prior lumbar laminectomy at L5-S1.  He comes in today with worsening low back pain that is radiating down both right and left leg.  He said is really mostly on the right side of the right leg but it is bilateral.  He reports very good success with prior epidural injection which was a bilateral L4 injection in December.  He reports up until just a few weeks ago he was doing quite well.  He reports over 50% relief and greater functional movement after the injection.  He reports now that when he exercises the pain gets worse.  Again he reports great relief with injection and really no relief with medications or therapy in the past.  He is really not interested in any new surgery.  The last MRI was in 2015 we may consider repeating this at some point in the future although he has no red flag complaints today.  He also has no real new complaints.  We will repeat the L4 transforaminal epidural steroid injection.   ROS Otherwise per HPI.  Assessment & Plan: Visit Diagnoses:  1. Lumbar radiculopathy   2. Spinal stenosis of lumbar region with neurogenic claudication     Plan: No additional findings.   Meds & Orders:  Meds ordered this encounter  Medications  . betamethasone acetate-betamethasone sodium phosphate (CELESTONE) injection 12 mg    Orders Placed This Encounter  Procedures  . XR C-ARM NO REPORT  . Epidural Steroid injection    Follow-up: Return if symptoms worsen or fail to improve.   Procedures: No procedures performed  Lumbosacral Transforaminal Epidural Steroid  Injection - Sub-Pedicular Approach with Fluoroscopic Guidance  Patient: Joshua Strong      Date of Birth: [birth year] MRN: [id number] PCP: [name], MD      Visit Date: 12/23/2017   Universal Protocol:    Date/Time: 12/23/2017  Consent Given By: the patient  Position: PRONE  Additional Comments: Vital signs were monitored before and after the procedure. Patient was prepped and draped in the usual sterile fashion. The correct patient, procedure, and site was verified.   Injection Procedure Details:  Procedure Site One Meds Administered:  Meds ordered this encounter  Medications  . betamethasone acetate-betamethasone sodium phosphate (CELESTONE) injection 12 mg    Laterality: Bilateral  Location/Site:  L4-L5  Needle size: 22 G  Needle type: Spinal  Needle Placement: Transforaminal  Findings:    -Comments: Excellent flow of contrast along the nerve and into the epidural space.  Procedure Details: After squaring off the end-plates to get a true AP view, the C-arm was positioned so that an oblique view of the foramen as noted above was visualized. The target area is just inferior to the "nose of the scotty dog" or sub pedicular. The soft tissues overlying this structure were infiltrated with 2-3 ml. of 1% Lidocaine without Epinephrine.  The spinal needle was inserted toward the target using a "trajectory" view along the fluoroscope beam.  Under AP and lateral visualization, the needle was  advanced so it did not puncture dura and was located close the 6 O'Clock position of the pedical in AP tracterory. Biplanar projections were used to confirm position. Aspiration was confirmed to be negative for CSF and/or blood. A 1-2 ml. volume of Isovue-250 was injected and flow of contrast was noted at each level. Radiographs were obtained for documentation purposes.   After attaining the desired flow of contrast documented above, a 0.5 to 1.0 ml test dose of 0.25% Marcaine was  injected into each respective transforaminal space.  The patient was observed for 90 seconds post injection.  After no sensory deficits were reported, and normal lower extremity motor function was noted,   the above injectate was administered so that equal amounts of the injectate were placed at each foramen (level) into the transforaminal epidural space.   Additional Comments:  The patient tolerated the procedure well Dressing: Band-Aid    Post-procedure details: Patient was observed during the procedure. Post-procedure instructions were reviewed.  Patient left the clinic in stable condition.    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 At L2-3 with mild to moderate central narrowing with left-sided lateral recess narrowing. At L4-5 there is moderate multifactorial stenosis and facet arthropathy with lateral recess narrowing. At L5-S1 he is status post laminectomy with bulky fibrosis but otherwise open canal.   He reports that he quit smoking about 61 years ago. His smoking use included cigarettes. He has a 1.50 pack-year smoking history. He has never used smokeless tobacco. No results for input(s): HGBA1C, LABURIC in the last 8760 hours.  Objective:  VS:  HT:    WT:   BMI:     BP:(!) 144/84  HR:(!) 59bpm  TEMP:97.6 F (36.4 C)(Oral)  RESP:97 % Physical Exam  Ortho Exam Imaging: No results found.  Past Medical/Family/Surgical/Social History: Medications & Allergies reviewed per EMR, new medications updated. Patient Active Problem List   Diagnosis Date Noted  . Presence of right artificial knee joint 05/20/2017  . Unilateral primary osteoarthritis, left knee 05/20/2017  . History of total right knee replacement 02/17/2017  . Chronic pain of left knee 02/17/2017  . Osteoarthritis of right knee 05/24/2016  . Status post total right knee replacement 05/24/2016  . Diverticulosis of colon without  hemorrhage   . CONSTIPATION 04/19/2010  . ABDOMINAL PAIN, LEFT LOWER QUADRANT 09/18/2009  . EPIGASTRIC PAIN 09/18/2009  . CARCINOMA, RENAL CELL 08/08/2008  . PNEUMONIA 08/08/2008  . ACUTE PANCREATITIS 08/08/2008  . NAUSEA 08/08/2008  . ABDOMINAL PAIN, CHRONIC 08/08/2008   Past Medical History:  Diagnosis Date  . GERD (gastroesophageal reflux disease)   . GI bleed    ulcer  . Hemorrhoid   . Pancreatitis   . Pneumonia   . Renal cell carcinoma ([place])   . Tubular adenoma   . Tubular adenoma 2013   Family History  Adopted: Yes   Past Surgical History:  Procedure Laterality Date  . CHOLECYSTECTOMY    . COLONOSCOPY  06/16/2007   Dr. Gala Romney- minimal internal hemorrhoids o/w normal rectum,tubular adenoma  . COLONOSCOPY  07/02/2012   Dr.Rourk- normal rectum, 3 5-7 mm peduncuated polyos, one in the mid sigmoid segment and 2 in the ascending segment. remainder of the colonic mucosa appeared normal. bx= tubular adenoma  . COLONOSCOPY N/A 07/17/2015   Procedure: COLONOSCOPY;  Surgeon: Daneil Dolin, MD;  Location[facility];  Service: Endoscopy;  Laterality: N/A;  730   . EYE SURGERY  cataract with lens implants  . HAND SURGERY    . KNEE SURGERY     right  . NEPHRECTOMY Right   . pancreatic duct stent    . SHOULDER SURGERY     left  . SPINAL FUSION    . testicular tumor    . TOTAL KNEE ARTHROPLASTY Right 05/24/2016   Procedure: RIGHT TOTAL KNEE ARTHROPLASTY;  Surgeon: Mcarthur Rossetti, MD;  Location: [facility];  Service: Orthopedics;  Laterality: Right;   Social History   Occupational History  . Not on file  Tobacco Use  . Smoking status: Former Smoker    Packs/day: 0.50    Years: 3.00    Pack years: 1.50    Types: Cigarettes    Last attempt to quit: 05/17/1956    Years since quitting: 61.6  . Smokeless tobacco: Never Used  . Tobacco comment: quit in 1958  Substance and Sexual Activity  . Alcohol use: No  . Drug use: No  . Sexual activity: Not on file

## 2018-01-23 DIAGNOSIS — Z6827 Body mass index (BMI) 27.0-27.9, adult: Secondary | ICD-10-CM | POA: Diagnosis not present

## 2018-01-23 DIAGNOSIS — M353 Polymyalgia rheumatica: Secondary | ICD-10-CM | POA: Diagnosis not present

## 2018-02-24 DIAGNOSIS — L570 Actinic keratosis: Secondary | ICD-10-CM | POA: Diagnosis not present

## 2018-02-24 DIAGNOSIS — Z85820 Personal history of malignant melanoma of skin: Secondary | ICD-10-CM | POA: Diagnosis not present

## 2018-04-06 ENCOUNTER — Telehealth (INDEPENDENT_AMBULATORY_CARE_PROVIDER_SITE_OTHER): Payer: Self-pay | Admitting: Physical Medicine and Rehabilitation

## 2018-04-06 DIAGNOSIS — Z6826 Body mass index (BMI) 26.0-26.9, adult: Secondary | ICD-10-CM | POA: Diagnosis not present

## 2018-04-06 DIAGNOSIS — M7050 Other bursitis of knee, unspecified knee: Secondary | ICD-10-CM | POA: Diagnosis not present

## 2018-04-06 NOTE — Telephone Encounter (Signed)
If it helped "greatly" then ok to repeat otherwise needs new MRI lspine, last 2015

## 2018-04-07 NOTE — Telephone Encounter (Signed)
//  Patient states that after a few weeks he had 80- 90% relief from the last injection. Scheduled for a repeat on 04/23/18 at [facility] with a driver.

## 2018-04-13 ENCOUNTER — Telehealth (INDEPENDENT_AMBULATORY_CARE_PROVIDER_SITE_OTHER): Payer: Self-pay | Admitting: *Deleted

## 2018-04-13 ENCOUNTER — Other Ambulatory Visit (INDEPENDENT_AMBULATORY_CARE_PROVIDER_SITE_OTHER): Payer: Self-pay | Admitting: Orthopaedic Surgery

## 2018-04-13 MED ORDER — TRAMADOL HCL 50 MG PO TABS: 50.0000 mg | ORAL_TABLET | Freq: Four times a day (QID) | ORAL | 0 refills | Status: DC | PRN

## 2018-04-13 NOTE — Telephone Encounter (Signed)
I sent in some tramadol for him.

## 2018-04-13 NOTE — Telephone Encounter (Signed)
Please see below and advise.

## 2018-04-14 NOTE — Telephone Encounter (Signed)
I called pt and advised that rx has been faxed to pharm.

## 2018-04-23 ENCOUNTER — Ambulatory Visit (INDEPENDENT_AMBULATORY_CARE_PROVIDER_SITE_OTHER): Payer: Self-pay

## 2018-04-23 ENCOUNTER — Ambulatory Visit (INDEPENDENT_AMBULATORY_CARE_PROVIDER_SITE_OTHER): Payer: Medicare Other | Admitting: Physical Medicine and Rehabilitation

## 2018-04-23 ENCOUNTER — Encounter (INDEPENDENT_AMBULATORY_CARE_PROVIDER_SITE_OTHER): Payer: Self-pay | Admitting: Physical Medicine and Rehabilitation

## 2018-04-23 VITALS — BP 136/75 | HR 58

## 2018-04-23 DIAGNOSIS — M5416 Radiculopathy, lumbar region: Secondary | ICD-10-CM

## 2018-04-23 DIAGNOSIS — M48062 Spinal stenosis, lumbar region with neurogenic claudication: Secondary | ICD-10-CM | POA: Diagnosis not present

## 2018-04-23 DIAGNOSIS — M170 Bilateral primary osteoarthritis of knee: Secondary | ICD-10-CM

## 2018-04-23 MED ORDER — BETAMETHASONE SOD PHOS & ACET 6 (3-3) MG/ML IJ SUSP
12.0000 mg | Freq: Once | INTRAMUSCULAR | Status: AC
  Administered 2018-04-23: 12 mg

## 2018-04-23 NOTE — Patient Instructions (Signed)

## 2018-04-23 NOTE — Progress Notes (Signed)
 .  Numeric Pain Rating Scale and Functional Assessment Average Pain 7   In the last MONTH (on 0-10 scale) has pain interfered with the following?  1. General activity like being  able to carry out your everyday physical activities such as walking, climbing stairs, carrying groceries, or moving a chair?  Rating(4)   +Driver, -BT, -Dye Allergies.  

## 2018-04-24 NOTE — Procedures (Signed)
Lumbosacral Transforaminal Epidural Steroid Injection - Sub-Pedicular Approach with Fluoroscopic Guidance  Patient: Joshua Strong      Date of Birth: June 12, 1931 MRN: [id number] PCP: [name], MD      Visit Date: 04/23/2018   Universal Protocol:    Date/Time: 04/23/2018  Consent Given By: the patient  Position: PRONE  Additional Comments: Vital signs were monitored before and after the procedure. Patient was prepped and draped in the usual sterile fashion. The correct patient, procedure, and site was verified.   Injection Procedure Details:  Procedure Site One Meds Administered:  Meds ordered this encounter  Medications  . betamethasone acetate-betamethasone sodium phosphate (CELESTONE) injection 12 mg    Laterality: Bilateral  Location/Site:  L4-L5  Needle size: 22 G  Needle type: Spinal  Needle Placement: Transforaminal  Findings:    -Comments: Excellent flow of contrast along the nerve and into the epidural space.  Procedure Details: After squaring off the end-plates to get a true AP view, the C-arm was positioned so that an oblique view of the foramen as noted above was visualized. The target area is just inferior to the "nose of the scotty dog" or sub pedicular. The soft tissues overlying this structure were infiltrated with 2-3 ml. of 1% Lidocaine without Epinephrine.  The spinal needle was inserted toward the target using a "trajectory" view along the fluoroscope beam.  Under AP and lateral visualization, the needle was advanced so it did not puncture dura and was located close the 6 O'Clock position of the pedical in AP tracterory. Biplanar projections were used to confirm position. Aspiration was confirmed to be negative for CSF and/or blood. A 1-2 ml. volume of Isovue-250 was injected and flow of contrast was noted at each level. Radiographs were obtained for documentation purposes.   After attaining the desired flow of contrast documented above, a 0.5 to  1.0 ml test dose of 0.25% Marcaine was injected into each respective transforaminal space.  The patient was observed for 90 seconds post injection.  After no sensory deficits were reported, and normal lower extremity motor function was noted,   the above injectate was administered so that equal amounts of the injectate were placed at each foramen (level) into the transforaminal epidural space.   Additional Comments:  The patient tolerated the procedure well Dressing: Band-Aid    Post-procedure details: Patient was observed during the procedure. Post-procedure instructions were reviewed.  Patient left the clinic in stable condition.

## 2018-04-24 NOTE — Progress Notes (Signed)
Joshua Strong - 84 y.o. male MRN [id number]  Date of birth: Oct 05, 1930  Office Visit Note: Visit Date: 04/23/2018 PCP: [name], MD Referred by: Sasser, Silvestre Moment, MD  Subjective: Chief Complaint  Patient presents with  . Lower Back - Pain  . Right Leg - Pain   HPI: Joshua Strong is a pleasant 84 year old gentleman that I been seeing for a number of years and I see him about 3 times a year for transforaminal epidural steroid injection at L4 Duda stenosis.  Prior MRI was in 2015 showing moderate stenosis at this level with multifactorial facet arthropathy and degenerative changes.  He has had prior discectomy laminectomy at L5-S1.  His pain today is really mostly on the right but bilateral.  He has had no trauma since I have seen him.  His wife endorses that the patient has had some falls in the past but not really recently.  These seem to be retropulsion type falls backwards.  We spoke today at length and he said he is having difficulty walking at times it just feels like he has a hard time walking.  He does have knee arthritis which will talk about below.  He is very stiff.  He has not noted any tremor.  He has not noted any other neurologic issues or with focal weakness.  He feels like his back pain is the same back pain as before and the injection seem to help quite a bit and this was performed in April.  In terms of his walking and leg pain he reports pain on the medial sides of both knees at the tibial area.  On exam he is exquisitely tender over the pes anserine bursa.  This does reproduce his pain.  He does have bilateral knee arthritis and this is treated by Dr. Ninfa Linden who recently provided injections for Visco supplementation.  He says that has helped to a degree.  He has tried a topical medication on his knees but is unsure if this was Voltaren gel or some other medication.  He said it helped for a day or 2 and then he really was scared to take it any longer because he thought it was  something that could have bad side effects after reading the label.   Review of Systems  Constitutional: Negative for chills, fever, malaise/fatigue and weight loss.  HENT: Negative for hearing loss and sinus pain.   Eyes: Negative for blurred vision, double vision and photophobia.  Respiratory: Negative for cough and shortness of breath.   Cardiovascular: Negative for chest pain, palpitations and leg swelling.  Gastrointestinal: Negative for abdominal pain, nausea and vomiting.  Genitourinary: Negative for flank pain.  Musculoskeletal: Positive for back pain, falls and joint pain. Negative for myalgias.  Skin: Negative for itching and rash.  Neurological: Negative for tingling, tremors, focal weakness and weakness.  Endo/Heme/Allergies: Negative.   Psychiatric/Behavioral: Negative for depression.  All other systems reviewed and are negative.  Otherwise per HPI.  Assessment & Plan: Visit Diagnoses:  1. Spinal stenosis of lumbar region with neurogenic claudication   2. Lumbar radiculopathy   3. Bilateral primary osteoarthritis of knee     Plan: Findings:  1.  In terms of his back and right hip and leg and somewhat left hip I do think this is related to his multifactorial lumbar stenosis at L4-5.  Last injection was very beneficial for him up until a couple weeks ago.  It slowly returned over that time and now problematic.  He has taken tramadol in the past as well as hydrocodone when it was flared up.  He reports that the tramadol does not help as much as the hydrocodone.  We discussed the use of opioids with him and would like to have him be able to function without the use of those.  Dr. Ninfa Linden did recently refill his tramadol.  Today I think the best approach is repeating the epidural injection at L4 since he did well.  He has no red flag symptoms to make [place] warrant a new MRI at this point although we discussed this on a few occasions.  I think depending on how this injection goes we  would look at repeating his lumbar spine MRI to see if there is worsening stenosis or any other pathology since it is been 4 years since that has been done.  2.  In terms of his knees and leg pain he gets confused about which etiology of pain is present.  He clearly has knee problems treated by Dr. Ninfa Linden.  He has had one knee replaced in the other knee getting Visco supplementation and intermittent steroid injection.  He clearly has pes anserine bursitis bilaterally.  The best approach here is to combine a short course of physical therapy with iontophoresis looking at his posterior and ambulating and balance.  He is having some balance issues as well.  If they can do iontophoresis I would consider local steroid injection of the bursa versus topical medication.  We discussed the topical medication in general with him about it being fairly safe depending on if it is a topical anti-inflammatory.    Meds & Orders:  Meds ordered this encounter  Medications  . betamethasone acetate-betamethasone sodium phosphate (CELESTONE) injection 12 mg    Orders Placed This Encounter  Procedures  . XR C-ARM NO REPORT  . Ambulatory referral to Physical Therapy  . Epidural Steroid injection    Follow-up: Return if symptoms worsen or fail to improve.   Procedures: No procedures performed  Lumbosacral Transforaminal Epidural Steroid Injection - Sub-Pedicular Approach with Fluoroscopic Guidance  Patient: Joshua Strong      Date of Birth: 22-May-1931 MRN: [id number] PCP: [name], MD      Visit Date: 04/23/2018   Universal Protocol:    Date/Time: 04/23/2018  Consent Given By: the patient  Position: PRONE  Additional Comments: Vital signs were monitored before and after the procedure. Patient was prepped and draped in the usual sterile fashion. The correct patient, procedure, and site was verified.   Injection Procedure Details:  Procedure Site One Meds Administered:  Meds ordered this  encounter  Medications  . betamethasone acetate-betamethasone sodium phosphate (CELESTONE) injection 12 mg    Laterality: Bilateral  Location/Site:  L4-L5  Needle size: 22 G  Needle type: Spinal  Needle Placement: Transforaminal  Findings:    -Comments: Excellent flow of contrast along the nerve and into the epidural space.  Procedure Details: After squaring off the end-plates to get a true AP view, the C-arm was positioned so that an oblique view of the foramen as noted above was visualized. The target area is just inferior to the "nose of the scotty dog" or sub pedicular. The soft tissues overlying this structure were infiltrated with 2-3 ml. of 1% Lidocaine without Epinephrine.  The spinal needle was inserted toward the target using a "trajectory" view along the fluoroscope beam.  Under AP and lateral visualization, the needle was advanced so it did not puncture dura and  was located close the 6 O'Clock position of the pedical in AP tracterory. Biplanar projections were used to confirm position. Aspiration was confirmed to be negative for CSF and/or blood. A 1-2 ml. volume of Isovue-250 was injected and flow of contrast was noted at each level. Radiographs were obtained for documentation purposes.   After attaining the desired flow of contrast documented above, a 0.5 to 1.0 ml test dose of 0.25% Marcaine was injected into each respective transforaminal space.  The patient was observed for 90 seconds post injection.  After no sensory deficits were reported, and normal lower extremity motor function was noted,   the above injectate was administered so that equal amounts of the injectate were placed at each foramen (level) into the transforaminal epidural space.   Additional Comments:  The patient tolerated the procedure well Dressing: Band-Aid    Post-procedure details: Patient was observed during the procedure. Post-procedure instructions were reviewed.  Patient left the clinic  in stable condition.    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 At L2-3 with mild to moderate central narrowing with left-sided lateral recess narrowing. At L4-5 there is moderate multifactorial stenosis and facet arthropathy with lateral recess narrowing. At L5-S1 he is status post laminectomy with bulky fibrosis but otherwise open canal.   He reports that he quit smoking about 61 years ago. His smoking use included cigarettes. He has a 1.50 pack-year smoking history. He has never used smokeless tobacco. No results for input(s): HGBA1C, LABURIC in the last 8760 hours.  Objective:  VS:  HT:    WT:   BMI:     BP:136/75  HR:(!) 58bpm  TEMP: ( )  RESP:  Physical Exam  Constitutional: He is oriented to person, place, and time. He appears well-developed and well-nourished. No distress.  HENT:  Head: Normocephalic and atraumatic.  Nose: Nose normal.  Mouth/Throat: Oropharynx is clear and moist.  Eyes: Pupils are equal, round, and reactive to light. Conjunctivae are normal.  Neck: Neck supple. No JVD present. No tracheal deviation present.  Cardiovascular: Regular rhythm and intact distal pulses.  Pulmonary/Chest: Effort normal and breath sounds normal.  Abdominal: Soft. He exhibits no distension. There is no rebound and no guarding.  Musculoskeletal: He exhibits no deformity.  Patient has trouble going from sit to stand and to full extension with pain on extension.  He has no pain with hip rotation.  He has good distal strength without clonus.  Again no cogwheeling.  He has clear significant pain with palpation over the pes anserine bursa  Neurological: He is alert and oriented to person, place, and time. He exhibits normal muscle tone. Coordination normal.  Patient has a negative Romberg and negative pronator drift.  He has no resting tremor.  No cogwheeling.  Skin: Skin is warm. No rash noted.  Psychiatric:  He has a normal mood and affect. His behavior is normal.  Nursing note and vitals reviewed.   Ortho Exam Imaging: Xr C-arm No Report  Result Date: 04/23/2018 Please see Notes tab for imaging impression.   Past Medical/Family/Surgical/Social History: Medications & Allergies reviewed per EMR, new medications updated. Patient Active Problem List   Diagnosis Date Noted  . Presence of right artificial knee joint 05/20/2017  . Unilateral primary osteoarthritis, left knee 05/20/2017  . History of total right knee replacement 02/17/2017  . Chronic pain of left knee 02/17/2017  . Osteoarthritis of right knee 05/24/2016  . Status post total  right knee replacement 05/24/2016  . Diverticulosis of colon without hemorrhage   . CONSTIPATION 04/19/2010  . ABDOMINAL PAIN, LEFT LOWER QUADRANT 09/18/2009  . EPIGASTRIC PAIN 09/18/2009  . CARCINOMA, RENAL CELL 08/08/2008  . PNEUMONIA 08/08/2008  . ACUTE PANCREATITIS 08/08/2008  . NAUSEA 08/08/2008  . ABDOMINAL PAIN, CHRONIC 08/08/2008   Past Medical History:  Diagnosis Date  . GERD (gastroesophageal reflux disease)   . GI bleed    ulcer  . Hemorrhoid   . Pancreatitis   . Pneumonia   . Renal cell carcinoma ([place])   . Tubular adenoma   . Tubular adenoma 2013   Family History  Adopted: Yes   Past Surgical History:  Procedure Laterality Date  . CHOLECYSTECTOMY    . COLONOSCOPY  06/16/2007   Dr. Gala Romney- minimal internal hemorrhoids o/w normal rectum,tubular adenoma  . COLONOSCOPY  07/02/2012   Dr.Rourk- normal rectum, 3 5-7 mm peduncuated polyos, one in the mid sigmoid segment and 2 in the ascending segment. remainder of the colonic mucosa appeared normal. bx= tubular adenoma  . COLONOSCOPY N/A 07/17/2015   Procedure: COLONOSCOPY;  Surgeon: Daneil Dolin, MD;  Location[facility];  Service: Endoscopy;  Laterality: N/A;  730   . EYE SURGERY     cataract with lens implants  . HAND SURGERY    . KNEE SURGERY     right  . NEPHRECTOMY  Right   . pancreatic duct stent    . SHOULDER SURGERY     left  . SPINAL FUSION    . testicular tumor    . TOTAL KNEE ARTHROPLASTY Right 05/24/2016   Procedure: RIGHT TOTAL KNEE ARTHROPLASTY;  Surgeon: Mcarthur Rossetti, MD;  Location: [facility];  Service: Orthopedics;  Laterality: Right;   Social History   Occupational History  . Not on file  Tobacco Use  . Smoking status: Former Smoker    Packs/day: 0.50    Years: 3.00    Pack years: 1.50    Types: Cigarettes    Last attempt to quit: 05/17/1956    Years since quitting: 61.9  . Smokeless tobacco: Never Used  . Tobacco comment: quit in 1958  Substance and Sexual Activity  . Alcohol use: No  . Drug use: No  . Sexual activity: Not on file

## 2018-05-18 DIAGNOSIS — E7801 Familial hypercholesterolemia: Secondary | ICD-10-CM | POA: Diagnosis not present

## 2018-05-18 DIAGNOSIS — E782 Mixed hyperlipidemia: Secondary | ICD-10-CM | POA: Diagnosis not present

## 2018-05-18 DIAGNOSIS — M71561 Other bursitis, not elsewhere classified, right knee: Secondary | ICD-10-CM | POA: Diagnosis not present

## 2018-05-18 DIAGNOSIS — M170 Bilateral primary osteoarthritis of knee: Secondary | ICD-10-CM | POA: Diagnosis not present

## 2018-05-18 DIAGNOSIS — M71562 Other bursitis, not elsewhere classified, left knee: Secondary | ICD-10-CM | POA: Diagnosis not present

## 2018-05-18 DIAGNOSIS — N183 Chronic kidney disease, stage 3 (moderate): Secondary | ICD-10-CM | POA: Diagnosis not present

## 2018-05-18 DIAGNOSIS — K210 Gastro-esophageal reflux disease with esophagitis: Secondary | ICD-10-CM | POA: Diagnosis not present

## 2018-05-21 DIAGNOSIS — R42 Dizziness and giddiness: Secondary | ICD-10-CM | POA: Diagnosis not present

## 2018-05-21 DIAGNOSIS — N183 Chronic kidney disease, stage 3 (moderate): Secondary | ICD-10-CM | POA: Diagnosis not present

## 2018-05-21 DIAGNOSIS — R5383 Other fatigue: Secondary | ICD-10-CM | POA: Diagnosis not present

## 2018-05-21 DIAGNOSIS — R51 Headache: Secondary | ICD-10-CM | POA: Diagnosis not present

## 2018-05-21 DIAGNOSIS — R296 Repeated falls: Secondary | ICD-10-CM | POA: Diagnosis not present

## 2018-05-21 DIAGNOSIS — R7301 Impaired fasting glucose: Secondary | ICD-10-CM | POA: Diagnosis not present

## 2018-05-21 DIAGNOSIS — E782 Mixed hyperlipidemia: Secondary | ICD-10-CM | POA: Diagnosis not present

## 2018-05-21 DIAGNOSIS — Z6826 Body mass index (BMI) 26.0-26.9, adult: Secondary | ICD-10-CM | POA: Diagnosis not present

## 2018-05-22 DIAGNOSIS — M71562 Other bursitis, not elsewhere classified, left knee: Secondary | ICD-10-CM | POA: Diagnosis not present

## 2018-05-22 DIAGNOSIS — M71561 Other bursitis, not elsewhere classified, right knee: Secondary | ICD-10-CM | POA: Diagnosis not present

## 2018-05-22 DIAGNOSIS — M170 Bilateral primary osteoarthritis of knee: Secondary | ICD-10-CM | POA: Diagnosis not present

## 2018-05-26 DIAGNOSIS — M353 Polymyalgia rheumatica: Secondary | ICD-10-CM | POA: Diagnosis not present

## 2018-05-26 DIAGNOSIS — Z85528 Personal history of other malignant neoplasm of kidney: Secondary | ICD-10-CM | POA: Diagnosis not present

## 2018-05-26 DIAGNOSIS — K219 Gastro-esophageal reflux disease without esophagitis: Secondary | ICD-10-CM | POA: Diagnosis not present

## 2018-05-26 DIAGNOSIS — M810 Age-related osteoporosis without current pathological fracture: Secondary | ICD-10-CM | POA: Diagnosis not present

## 2018-05-26 DIAGNOSIS — Z9181 History of falling: Secondary | ICD-10-CM | POA: Diagnosis not present

## 2018-05-26 DIAGNOSIS — Z905 Acquired absence of kidney: Secondary | ICD-10-CM | POA: Diagnosis not present

## 2018-05-26 DIAGNOSIS — R413 Other amnesia: Secondary | ICD-10-CM | POA: Diagnosis not present

## 2018-05-26 DIAGNOSIS — R2689 Other abnormalities of gait and mobility: Secondary | ICD-10-CM | POA: Diagnosis not present

## 2018-05-26 DIAGNOSIS — K861 Other chronic pancreatitis: Secondary | ICD-10-CM | POA: Diagnosis not present

## 2018-05-26 DIAGNOSIS — N289 Disorder of kidney and ureter, unspecified: Secondary | ICD-10-CM | POA: Diagnosis not present

## 2018-05-27 DIAGNOSIS — M170 Bilateral primary osteoarthritis of knee: Secondary | ICD-10-CM | POA: Diagnosis not present

## 2018-05-27 DIAGNOSIS — M71561 Other bursitis, not elsewhere classified, right knee: Secondary | ICD-10-CM | POA: Diagnosis not present

## 2018-05-27 DIAGNOSIS — M71562 Other bursitis, not elsewhere classified, left knee: Secondary | ICD-10-CM | POA: Diagnosis not present

## 2018-05-29 DIAGNOSIS — M71562 Other bursitis, not elsewhere classified, left knee: Secondary | ICD-10-CM | POA: Diagnosis not present

## 2018-05-29 DIAGNOSIS — M71561 Other bursitis, not elsewhere classified, right knee: Secondary | ICD-10-CM | POA: Diagnosis not present

## 2018-05-29 DIAGNOSIS — M170 Bilateral primary osteoarthritis of knee: Secondary | ICD-10-CM | POA: Diagnosis not present

## 2018-06-03 DIAGNOSIS — M5416 Radiculopathy, lumbar region: Secondary | ICD-10-CM | POA: Diagnosis not present

## 2018-06-03 DIAGNOSIS — R262 Difficulty in walking, not elsewhere classified: Secondary | ICD-10-CM | POA: Diagnosis not present

## 2018-06-03 DIAGNOSIS — M545 Low back pain: Secondary | ICD-10-CM | POA: Diagnosis not present

## 2018-06-03 DIAGNOSIS — M7050 Other bursitis of knee, unspecified knee: Secondary | ICD-10-CM | POA: Diagnosis not present

## 2018-06-03 DIAGNOSIS — M25569 Pain in unspecified knee: Secondary | ICD-10-CM | POA: Diagnosis not present

## 2018-06-03 DIAGNOSIS — M1710 Unilateral primary osteoarthritis, unspecified knee: Secondary | ICD-10-CM | POA: Diagnosis not present

## 2018-06-04 DIAGNOSIS — M1710 Unilateral primary osteoarthritis, unspecified knee: Secondary | ICD-10-CM | POA: Diagnosis not present

## 2018-06-04 DIAGNOSIS — M25569 Pain in unspecified knee: Secondary | ICD-10-CM | POA: Diagnosis not present

## 2018-06-04 DIAGNOSIS — Z0001 Encounter for general adult medical examination with abnormal findings: Secondary | ICD-10-CM | POA: Diagnosis not present

## 2018-06-04 DIAGNOSIS — M5416 Radiculopathy, lumbar region: Secondary | ICD-10-CM | POA: Diagnosis not present

## 2018-06-04 DIAGNOSIS — R262 Difficulty in walking, not elsewhere classified: Secondary | ICD-10-CM | POA: Diagnosis not present

## 2018-06-04 DIAGNOSIS — Z681 Body mass index (BMI) 19 or less, adult: Secondary | ICD-10-CM | POA: Diagnosis not present

## 2018-06-04 DIAGNOSIS — M545 Low back pain: Secondary | ICD-10-CM | POA: Diagnosis not present

## 2018-06-04 DIAGNOSIS — M7050 Other bursitis of knee, unspecified knee: Secondary | ICD-10-CM | POA: Diagnosis not present

## 2018-06-05 DIAGNOSIS — M545 Low back pain: Secondary | ICD-10-CM | POA: Diagnosis not present

## 2018-06-05 DIAGNOSIS — M5416 Radiculopathy, lumbar region: Secondary | ICD-10-CM | POA: Diagnosis not present

## 2018-06-05 DIAGNOSIS — M7050 Other bursitis of knee, unspecified knee: Secondary | ICD-10-CM | POA: Diagnosis not present

## 2018-06-05 DIAGNOSIS — M25569 Pain in unspecified knee: Secondary | ICD-10-CM | POA: Diagnosis not present

## 2018-06-05 DIAGNOSIS — M1710 Unilateral primary osteoarthritis, unspecified knee: Secondary | ICD-10-CM | POA: Diagnosis not present

## 2018-06-05 DIAGNOSIS — R262 Difficulty in walking, not elsewhere classified: Secondary | ICD-10-CM | POA: Diagnosis not present

## 2018-06-16 DIAGNOSIS — M7050 Other bursitis of knee, unspecified knee: Secondary | ICD-10-CM | POA: Diagnosis not present

## 2018-06-16 DIAGNOSIS — M5416 Radiculopathy, lumbar region: Secondary | ICD-10-CM | POA: Diagnosis not present

## 2018-06-16 DIAGNOSIS — M25569 Pain in unspecified knee: Secondary | ICD-10-CM | POA: Diagnosis not present

## 2018-06-16 DIAGNOSIS — M545 Low back pain: Secondary | ICD-10-CM | POA: Diagnosis not present

## 2018-06-16 DIAGNOSIS — M1710 Unilateral primary osteoarthritis, unspecified knee: Secondary | ICD-10-CM | POA: Diagnosis not present

## 2018-06-16 DIAGNOSIS — R262 Difficulty in walking, not elsewhere classified: Secondary | ICD-10-CM | POA: Diagnosis not present

## 2018-06-18 DIAGNOSIS — M8588 Other specified disorders of bone density and structure, other site: Secondary | ICD-10-CM | POA: Diagnosis not present

## 2018-06-26 DIAGNOSIS — M25569 Pain in unspecified knee: Secondary | ICD-10-CM | POA: Diagnosis not present

## 2018-06-26 DIAGNOSIS — M1710 Unilateral primary osteoarthritis, unspecified knee: Secondary | ICD-10-CM | POA: Diagnosis not present

## 2018-06-26 DIAGNOSIS — R262 Difficulty in walking, not elsewhere classified: Secondary | ICD-10-CM | POA: Diagnosis not present

## 2018-06-26 DIAGNOSIS — M7050 Other bursitis of knee, unspecified knee: Secondary | ICD-10-CM | POA: Diagnosis not present

## 2018-06-26 DIAGNOSIS — M5416 Radiculopathy, lumbar region: Secondary | ICD-10-CM | POA: Diagnosis not present

## 2018-06-26 DIAGNOSIS — M545 Low back pain: Secondary | ICD-10-CM | POA: Diagnosis not present

## 2018-07-14 DIAGNOSIS — M545 Low back pain: Secondary | ICD-10-CM | POA: Diagnosis not present

## 2018-07-14 DIAGNOSIS — R262 Difficulty in walking, not elsewhere classified: Secondary | ICD-10-CM | POA: Diagnosis not present

## 2018-07-14 DIAGNOSIS — M5416 Radiculopathy, lumbar region: Secondary | ICD-10-CM | POA: Diagnosis not present

## 2018-07-14 DIAGNOSIS — M1710 Unilateral primary osteoarthritis, unspecified knee: Secondary | ICD-10-CM | POA: Diagnosis not present

## 2018-07-14 DIAGNOSIS — M7050 Other bursitis of knee, unspecified knee: Secondary | ICD-10-CM | POA: Diagnosis not present

## 2018-07-14 DIAGNOSIS — M25569 Pain in unspecified knee: Secondary | ICD-10-CM | POA: Diagnosis not present

## 2018-07-16 DIAGNOSIS — M545 Low back pain: Secondary | ICD-10-CM | POA: Diagnosis not present

## 2018-07-16 DIAGNOSIS — M5416 Radiculopathy, lumbar region: Secondary | ICD-10-CM | POA: Diagnosis not present

## 2018-07-16 DIAGNOSIS — M1710 Unilateral primary osteoarthritis, unspecified knee: Secondary | ICD-10-CM | POA: Diagnosis not present

## 2018-07-16 DIAGNOSIS — M25569 Pain in unspecified knee: Secondary | ICD-10-CM | POA: Diagnosis not present

## 2018-07-16 DIAGNOSIS — R262 Difficulty in walking, not elsewhere classified: Secondary | ICD-10-CM | POA: Diagnosis not present

## 2018-07-16 DIAGNOSIS — Z23 Encounter for immunization: Secondary | ICD-10-CM | POA: Diagnosis not present

## 2018-07-16 DIAGNOSIS — M7050 Other bursitis of knee, unspecified knee: Secondary | ICD-10-CM | POA: Diagnosis not present

## 2018-07-21 DIAGNOSIS — M25569 Pain in unspecified knee: Secondary | ICD-10-CM | POA: Diagnosis not present

## 2018-07-21 DIAGNOSIS — M7050 Other bursitis of knee, unspecified knee: Secondary | ICD-10-CM | POA: Diagnosis not present

## 2018-07-21 DIAGNOSIS — M1710 Unilateral primary osteoarthritis, unspecified knee: Secondary | ICD-10-CM | POA: Diagnosis not present

## 2018-07-21 DIAGNOSIS — R262 Difficulty in walking, not elsewhere classified: Secondary | ICD-10-CM | POA: Diagnosis not present

## 2018-07-21 DIAGNOSIS — M5416 Radiculopathy, lumbar region: Secondary | ICD-10-CM | POA: Diagnosis not present

## 2018-07-21 DIAGNOSIS — M545 Low back pain: Secondary | ICD-10-CM | POA: Diagnosis not present

## 2018-07-24 DIAGNOSIS — M25569 Pain in unspecified knee: Secondary | ICD-10-CM | POA: Diagnosis not present

## 2018-07-24 DIAGNOSIS — R262 Difficulty in walking, not elsewhere classified: Secondary | ICD-10-CM | POA: Diagnosis not present

## 2018-07-24 DIAGNOSIS — M1710 Unilateral primary osteoarthritis, unspecified knee: Secondary | ICD-10-CM | POA: Diagnosis not present

## 2018-07-24 DIAGNOSIS — M545 Low back pain: Secondary | ICD-10-CM | POA: Diagnosis not present

## 2018-07-24 DIAGNOSIS — M5416 Radiculopathy, lumbar region: Secondary | ICD-10-CM | POA: Diagnosis not present

## 2018-07-24 DIAGNOSIS — M7050 Other bursitis of knee, unspecified knee: Secondary | ICD-10-CM | POA: Diagnosis not present

## 2018-07-28 DIAGNOSIS — M25569 Pain in unspecified knee: Secondary | ICD-10-CM | POA: Diagnosis not present

## 2018-07-28 DIAGNOSIS — M545 Low back pain: Secondary | ICD-10-CM | POA: Diagnosis not present

## 2018-07-28 DIAGNOSIS — M5416 Radiculopathy, lumbar region: Secondary | ICD-10-CM | POA: Diagnosis not present

## 2018-07-28 DIAGNOSIS — M1710 Unilateral primary osteoarthritis, unspecified knee: Secondary | ICD-10-CM | POA: Diagnosis not present

## 2018-07-28 DIAGNOSIS — M7050 Other bursitis of knee, unspecified knee: Secondary | ICD-10-CM | POA: Diagnosis not present

## 2018-07-28 DIAGNOSIS — R262 Difficulty in walking, not elsewhere classified: Secondary | ICD-10-CM | POA: Diagnosis not present

## 2018-07-30 DIAGNOSIS — M545 Low back pain: Secondary | ICD-10-CM | POA: Diagnosis not present

## 2018-07-30 DIAGNOSIS — M7050 Other bursitis of knee, unspecified knee: Secondary | ICD-10-CM | POA: Diagnosis not present

## 2018-07-30 DIAGNOSIS — R262 Difficulty in walking, not elsewhere classified: Secondary | ICD-10-CM | POA: Diagnosis not present

## 2018-07-30 DIAGNOSIS — M1710 Unilateral primary osteoarthritis, unspecified knee: Secondary | ICD-10-CM | POA: Diagnosis not present

## 2018-07-30 DIAGNOSIS — M25569 Pain in unspecified knee: Secondary | ICD-10-CM | POA: Diagnosis not present

## 2018-07-30 DIAGNOSIS — M5416 Radiculopathy, lumbar region: Secondary | ICD-10-CM | POA: Diagnosis not present

## 2018-08-04 DIAGNOSIS — M7050 Other bursitis of knee, unspecified knee: Secondary | ICD-10-CM | POA: Diagnosis not present

## 2018-08-04 DIAGNOSIS — M545 Low back pain: Secondary | ICD-10-CM | POA: Diagnosis not present

## 2018-08-04 DIAGNOSIS — M25569 Pain in unspecified knee: Secondary | ICD-10-CM | POA: Diagnosis not present

## 2018-08-04 DIAGNOSIS — M1710 Unilateral primary osteoarthritis, unspecified knee: Secondary | ICD-10-CM | POA: Diagnosis not present

## 2018-08-04 DIAGNOSIS — R262 Difficulty in walking, not elsewhere classified: Secondary | ICD-10-CM | POA: Diagnosis not present

## 2018-08-04 DIAGNOSIS — M5416 Radiculopathy, lumbar region: Secondary | ICD-10-CM | POA: Diagnosis not present

## 2018-08-06 DIAGNOSIS — M25569 Pain in unspecified knee: Secondary | ICD-10-CM | POA: Diagnosis not present

## 2018-08-06 DIAGNOSIS — M545 Low back pain: Secondary | ICD-10-CM | POA: Diagnosis not present

## 2018-08-06 DIAGNOSIS — R262 Difficulty in walking, not elsewhere classified: Secondary | ICD-10-CM | POA: Diagnosis not present

## 2018-08-06 DIAGNOSIS — M1710 Unilateral primary osteoarthritis, unspecified knee: Secondary | ICD-10-CM | POA: Diagnosis not present

## 2018-08-06 DIAGNOSIS — M7050 Other bursitis of knee, unspecified knee: Secondary | ICD-10-CM | POA: Diagnosis not present

## 2018-08-06 DIAGNOSIS — M5416 Radiculopathy, lumbar region: Secondary | ICD-10-CM | POA: Diagnosis not present

## 2018-08-26 DIAGNOSIS — Z6826 Body mass index (BMI) 26.0-26.9, adult: Secondary | ICD-10-CM | POA: Diagnosis not present

## 2018-08-26 DIAGNOSIS — A084 Viral intestinal infection, unspecified: Secondary | ICD-10-CM | POA: Diagnosis not present

## 2018-08-29 DIAGNOSIS — R197 Diarrhea, unspecified: Secondary | ICD-10-CM | POA: Diagnosis not present

## 2018-09-10 DIAGNOSIS — N183 Chronic kidney disease, stage 3 (moderate): Secondary | ICD-10-CM | POA: Diagnosis not present

## 2018-09-10 DIAGNOSIS — E7800 Pure hypercholesterolemia, unspecified: Secondary | ICD-10-CM | POA: Diagnosis not present

## 2018-09-10 DIAGNOSIS — K861 Other chronic pancreatitis: Secondary | ICD-10-CM | POA: Diagnosis not present

## 2018-09-10 DIAGNOSIS — K210 Gastro-esophageal reflux disease with esophagitis: Secondary | ICD-10-CM | POA: Diagnosis not present

## 2018-09-10 DIAGNOSIS — R7301 Impaired fasting glucose: Secondary | ICD-10-CM | POA: Diagnosis not present

## 2018-09-10 DIAGNOSIS — E7801 Familial hypercholesterolemia: Secondary | ICD-10-CM | POA: Diagnosis not present

## 2018-09-10 DIAGNOSIS — E782 Mixed hyperlipidemia: Secondary | ICD-10-CM | POA: Diagnosis not present

## 2018-09-10 DIAGNOSIS — R4189 Other symptoms and signs involving cognitive functions and awareness: Secondary | ICD-10-CM | POA: Diagnosis not present

## 2018-09-15 DIAGNOSIS — R51 Headache: Secondary | ICD-10-CM | POA: Diagnosis not present

## 2018-09-15 DIAGNOSIS — R42 Dizziness and giddiness: Secondary | ICD-10-CM | POA: Diagnosis not present

## 2018-09-15 DIAGNOSIS — Z6826 Body mass index (BMI) 26.0-26.9, adult: Secondary | ICD-10-CM | POA: Diagnosis not present

## 2018-09-15 DIAGNOSIS — R5383 Other fatigue: Secondary | ICD-10-CM | POA: Diagnosis not present

## 2018-09-15 DIAGNOSIS — N183 Chronic kidney disease, stage 3 (moderate): Secondary | ICD-10-CM | POA: Diagnosis not present

## 2018-09-15 DIAGNOSIS — R296 Repeated falls: Secondary | ICD-10-CM | POA: Diagnosis not present

## 2018-09-15 DIAGNOSIS — R7301 Impaired fasting glucose: Secondary | ICD-10-CM | POA: Diagnosis not present

## 2018-09-15 DIAGNOSIS — E782 Mixed hyperlipidemia: Secondary | ICD-10-CM | POA: Diagnosis not present

## 2018-09-29 DIAGNOSIS — H43393 Other vitreous opacities, bilateral: Secondary | ICD-10-CM | POA: Diagnosis not present

## 2018-10-27 ENCOUNTER — Encounter: Payer: Self-pay | Admitting: Internal Medicine

## 2018-10-27 ENCOUNTER — Ambulatory Visit: Payer: Medicare Other | Admitting: Internal Medicine

## 2018-10-27 ENCOUNTER — Other Ambulatory Visit: Payer: Self-pay | Admitting: *Deleted

## 2018-10-27 ENCOUNTER — Other Ambulatory Visit: Payer: Self-pay

## 2018-10-27 ENCOUNTER — Encounter: Payer: Self-pay | Admitting: *Deleted

## 2018-10-27 ENCOUNTER — Ambulatory Visit (INDEPENDENT_AMBULATORY_CARE_PROVIDER_SITE_OTHER): Payer: Medicare Other | Admitting: Internal Medicine

## 2018-10-27 ENCOUNTER — Telehealth: Payer: Self-pay

## 2018-10-27 VITALS — BP 118/66 | HR 71 | Temp 96.7°F | Ht 66.0 in | Wt 164.4 lb

## 2018-10-27 DIAGNOSIS — R109 Unspecified abdominal pain: Secondary | ICD-10-CM

## 2018-10-27 DIAGNOSIS — K219 Gastro-esophageal reflux disease without esophagitis: Secondary | ICD-10-CM | POA: Diagnosis not present

## 2018-10-27 DIAGNOSIS — R1032 Left lower quadrant pain: Secondary | ICD-10-CM | POA: Diagnosis not present

## 2018-10-27 LAB — CREATININE, SERUM: Creat: 1.59 mg/dL — ABNORMAL HIGH (ref 0.70–1.11)

## 2018-10-27 MED ORDER — PANTOPRAZOLE SODIUM 20 MG PO TBEC: 20.0000 mg | DELAYED_RELEASE_TABLET | Freq: Every day | ORAL | 11 refills | Status: DC

## 2018-10-27 NOTE — Telephone Encounter (Signed)
Left a message for pt to call back. Will discuss lab work needed prior to his procedure. Orders were placed.

## 2018-10-27 NOTE — Progress Notes (Signed)
Primary Care Physician:  [name], MD Primary Gastroenterologist:  Dr. Gala Romney  Pre-Procedure History & Physical: HPI:  Joshua Strong is a 85 y.o. male here for to be seen regarding recent bout of C. difficile diarrhea and left-sided abdominal pain.  Patient states he developed watery nonbloody diarrhea about a month ago and lasted for a week.  Saw [facility];    C. difficile found in stool treated with 10 days of vancomycin.  Diarrhea improved.  He has had intermittently as much as 7 out of 10 left-sided abdominal pain which he is never had before.  No change in bowel habits.  No nausea or vomiting.  Reflux symptoms have worsened recently now that he is not taking Protonix.  No dysphagia. No history of diverticulosis.  Last colonoscopy was 8 years ago demonstrated a single adenoma.  No follow-up colonoscopy recommended.  Long history of chronic pancreatitis.  Has been seen by Drs. Roney Mans and Luther at [facility] previously.  Last seen over there about a year ago.  Of note, most recent EUS demonstrated normal pancreatic parenchyma.  No evidence of persisting cyst.  It was felt that his pancreatitis is really settled down.  He was taken off pancreatic enzyme supplements about a year ago.  History of H. pylori treated H. pylori stool antigen confirmed to be negative at [facility].  Apparently, he also had a stool sent for fecal elastase.  I do not have that result for review.   Past Medical History:  Diagnosis Date  . GERD (gastroesophageal reflux disease)   . GI bleed    ulcer  . Hemorrhoid   . Pancreatitis   . Pneumonia   . Renal cell carcinoma ([place])   . Tubular adenoma   . Tubular adenoma 2013    Past Surgical History:  Procedure Laterality Date  . CHOLECYSTECTOMY    . COLONOSCOPY  06/16/2007   Dr. Gala Romney- minimal internal hemorrhoids o/w normal rectum,tubular adenoma  . COLONOSCOPY  07/02/2012   Dr.Kristelle Cavallaro- normal rectum, 3 5-7 mm peduncuated polyos, one in the mid sigmoid segment and 2 in  the ascending segment. remainder of the colonic mucosa appeared normal. bx= tubular adenoma  . COLONOSCOPY N/A 07/17/2015   Procedure: COLONOSCOPY;  Surgeon: Daneil Dolin, MD;  [facility];  Service: Endoscopy;  Laterality: N/A;  730   . EYE SURGERY     cataract with lens implants  . HAND SURGERY    . KNEE SURGERY     right  . NEPHRECTOMY Right   . pancreatic duct stent    . SHOULDER SURGERY     left  . SPINAL FUSION    . testicular tumor    . TOTAL KNEE ARTHROPLASTY Right 05/24/2016   Procedure: RIGHT TOTAL KNEE ARTHROPLASTY;  Surgeon: Mcarthur Rossetti, MD;  Location: [facility];  Service: Orthopedics;  Laterality: Right;    Prior to Admission medications   Medication Sig Start Date End Date Taking? Authorizing Provider  acetaminophen (TYLENOL) 500 MG tablet Take 500 mg by mouth every 6 (six) hours as needed for mild pain.   Yes [provider]  Apoaequorin (PREVAGEN PO) Take by mouth daily.   Yes [provider]  calcium citrate-vitamin D (CITRACAL+D) 315-200 MG-UNIT per tablet Take 1 tablet by mouth daily.   Yes [provider]  Multiple Vitamin (MULTIVITAMIN) tablet Take 1 tablet by mouth daily.   Yes [provider]  OVER THE COUNTER MEDICATION Super Beta Prostate twice daily  Yes [provider]  PRESCRIPTION MEDICATION Injection every 6 months for osteoporosis (receives at [facility])   Yes [provider]  promethazine (PHENERGAN) 25 MG tablet Take 25 mg by mouth every 8 (eight) hours as needed for nausea.  06/05/12  Yes [place] Pflum, Cristopher Estimable, MD  aspirin EC 325 MG EC tablet Take 1 tablet (325 mg total) by mouth 2 (two) times daily after a meal. Patient not taking: Reported on 12/23/2017 05/25/16   Mcarthur Rossetti, MD  diazepam (VALIUM) 5 MG tablet Take 1 by mouth 1 to 2 hours pre-procedure. May repeat if necessary. Patient not taking: Reported on 10/27/2018 12/22/17   Magnus Sinning, MD  pantoprazole (PROTONIX)  40 MG tablet Take by mouth. 03/28/17   [provider]  Teriparatide, Recombinant, (FORTEO) 600 MCG/2.4ML SOLN Inject 20 mcg into the skin daily.    [provider]  traMADol (ULTRAM) 50 MG tablet Take 1-2 tablets (50-100 mg total) by mouth every 6 (six) hours as needed for moderate pain. Patient not taking: Reported on 10/27/2018 04/13/18   Mcarthur Rossetti, MD    Allergies as of 10/27/2018 - Review Complete 10/27/2018  Allergen Reaction Noted  . Morphine and related Other (See Comments) 06/24/2012  . Metoclopramide hcl Rash     Family History  Adopted: Yes    Social History   Socioeconomic History  . Marital status: Married    Spouse name: Not on file  . Number of children: Not on file  . Years of education: Not on file  . Highest education level: Not on file  Occupational History  . Not on file  Social Needs  . Financial resource strain: Not on file  . Food insecurity:    Worry: Not on file    Inability: Not on file  . Transportation needs:    Medical: Not on file    Non-medical: Not on file  Tobacco Use  . Smoking status: Former Smoker    Packs/day: 0.50    Years: 3.00    Pack years: 1.50    Types: Cigarettes    Last attempt to quit: 05/17/1956    Years since quitting: 62.4  . Smokeless tobacco: Never Used  . Tobacco comment: quit in 1958  Substance and Sexual Activity  . Alcohol use: No  . Drug use: No  . Sexual activity: Not on file  Lifestyle  . Physical activity:    Days per week: Not on file    Minutes per session: Not on file  . Stress: Not on file  Relationships  . Social connections:    Talks on phone: Not on file    Gets together: Not on file    Attends religious service: Not on file    Active member of club or organization: Not on file    Attends meetings of clubs or organizations: Not on file    Relationship status: Not on file  . Intimate partner violence:    Fear of current or ex partner: Not on file    Emotionally  abused: Not on file    Physically abused: Not on file    Forced sexual activity: Not on file  Other Topics Concern  . Not on file  Social History Narrative  . Not on file    Review of Systems: See HPI, otherwise negative ROS  Physical Exam: BP 118/66   Pulse 71   Temp (!) 96.7 F (35.9 C) (Oral)   Ht 5\' 6"  (1.676 m)   Wt  164 lb 6.4 oz (74.6 kg)   BMI 26.53 kg/m  General:  pleasant and cooperative in NAD: Accompanied by spouse. Neck:  Supple; no masses or thyromegaly. No significant cervical adenopathy. Lungs:  Clear throughout to auscultation.   No wheezes, crackles, or rhonchi. No acute distress. Heart:  Regular rate and rhythm; no murmurs, clicks, rubs,  or gallops. Abdomen: Nondistended.  Positive bowel sounds.  He does have periumbilical left mid and lower quadrant abdominal pain to palpation.  I do not appreciate a mass organomegaly rebound tenderness or guarding.  Non-distended, normal bowel sounds.  Soft and nontender without appreciable mass or hepatosplenomegaly.  Pulses:  Normal pulses noted. Extremities:  Without clubbing or edema.  Impression/Plan: 1 month history of waxing and waning left-sided abdominal pain.  This is different discomfort than what he had with his pancreatitis previously.  Etiology uncertain.  Recent C. difficile by history diarrhea has resolved with a course of vancomycin by patient report.  Findings of the most recent [facility] evaluation quite reassuring as far as his pancreas is concerned.  However, further evaluation of abdominal pain warranted given the clinical scenario  GERD symptoms flared.  He has nearly daily symptoms.  He needs acid suppression therapy but will address conservatively with a recent C. difficile infection.   Recommendations:  GERD information provided  CDiff information provided  Begin Protonix 20 mg daily (30) with 11 refills  Proceed with contrast CT of the abdomen and pelvis - L sided abdominal pain  Patient  advised to maintain oral hydration aggressively immediately before and following the contrast CT.  Will check with [facility] regarding one stool test result  (fecal elastase) Which was a test for your pancreas.  Daily serving of Activia  Further recommendations to follow        This dictation was prepared with Dragon dictation along with smaller phrase technology. Any transcriptional errors that result from this process are unintentional and may not be corrected upon review.

## 2018-10-27 NOTE — Patient Instructions (Addendum)
   GERD information provided  CDiff information provided  Begin Protonix 20 mg daily (30) with 11 refills  Proceed with contrast CT of the abdomen and pelvis - L sided abdominal pain  Will check with [facility] regarding one stool test result  (fecal elastase) Which was a test for your pancreas.  Daily serving of Activia  Further recommendations to follow

## 2018-10-27 NOTE — Progress Notes (Signed)
Protonix 20 mg one capsule daily was sent into pts pharmacy with 11 rfs per RMR.

## 2018-11-11 ENCOUNTER — Ambulatory Visit (HOSPITAL_COMMUNITY)
Admission: RE | Admit: 2018-11-11 | Discharge: 2018-11-11 | Disposition: A | Discharge status: Home / Self Care | Payer: Medicare Other | Source: Ambulatory Visit | Attending: Internal Medicine | Admitting: Internal Medicine

## 2018-11-11 DIAGNOSIS — R109 Unspecified abdominal pain: Secondary | ICD-10-CM | POA: Diagnosis not present

## 2018-11-11 DIAGNOSIS — C641 Malignant neoplasm of right kidney, except renal pelvis: Secondary | ICD-10-CM | POA: Diagnosis not present

## 2018-11-11 DIAGNOSIS — C649 Malignant neoplasm of unspecified kidney, except renal pelvis: Secondary | ICD-10-CM | POA: Diagnosis not present

## 2018-11-11 MED ORDER — IOHEXOL 300 MG/ML  SOLN
75.0000 mL | Freq: Once | INTRAMUSCULAR | Status: AC | PRN
  Administered 2018-11-11: 75 mL via INTRAVENOUS

## 2018-11-12 ENCOUNTER — Other Ambulatory Visit: Payer: Self-pay

## 2018-11-12 DIAGNOSIS — R109 Unspecified abdominal pain: Secondary | ICD-10-CM

## 2018-11-16 DIAGNOSIS — R109 Unspecified abdominal pain: Secondary | ICD-10-CM | POA: Diagnosis not present

## 2018-11-24 ENCOUNTER — Telehealth: Payer: Self-pay | Admitting: Internal Medicine

## 2018-11-24 LAB — PANCREATIC ELASTASE, FECAL: Pancreatic Elastase-1, Stool: >500 mcg/g

## 2018-11-24 NOTE — Telephone Encounter (Signed)
Pt calling about stool results. [phone]

## 2018-11-24 NOTE — Telephone Encounter (Signed)
Returned call, Pt notified of results.

## 2018-12-03 DIAGNOSIS — M810 Age-related osteoporosis without current pathological fracture: Secondary | ICD-10-CM | POA: Diagnosis not present

## 2018-12-03 DIAGNOSIS — Z79899 Other long term (current) drug therapy: Secondary | ICD-10-CM | POA: Diagnosis not present

## 2018-12-07 ENCOUNTER — Ambulatory Visit (INDEPENDENT_AMBULATORY_CARE_PROVIDER_SITE_OTHER): Payer: Medicare Other

## 2018-12-07 ENCOUNTER — Ambulatory Visit (INDEPENDENT_AMBULATORY_CARE_PROVIDER_SITE_OTHER): Payer: Medicare Other | Admitting: Orthopaedic Surgery

## 2018-12-07 ENCOUNTER — Encounter (INDEPENDENT_AMBULATORY_CARE_PROVIDER_SITE_OTHER): Payer: Self-pay | Admitting: Orthopaedic Surgery

## 2018-12-07 DIAGNOSIS — M25562 Pain in left knee: Secondary | ICD-10-CM

## 2018-12-07 DIAGNOSIS — Z96651 Presence of right artificial knee joint: Secondary | ICD-10-CM | POA: Diagnosis not present

## 2018-12-07 DIAGNOSIS — G8929 Other chronic pain: Secondary | ICD-10-CM

## 2018-12-07 DIAGNOSIS — M1712 Unilateral primary osteoarthritis, left knee: Secondary | ICD-10-CM

## 2018-12-07 DIAGNOSIS — M25561 Pain in right knee: Secondary | ICD-10-CM

## 2018-12-07 MED ORDER — LIDOCAINE HCL 1 % IJ SOLN
3.0000 mL | INTRAMUSCULAR | Status: AC | PRN
  Administered 2018-12-07: 3 mL

## 2018-12-07 MED ORDER — METHYLPREDNISOLONE ACETATE 40 MG/ML IJ SUSP
40.0000 mg | INTRAMUSCULAR | Status: AC | PRN
  Administered 2018-12-07: 40 mg via INTRA_ARTICULAR

## 2018-12-07 NOTE — Progress Notes (Signed)
Office Visit Note   Patient: Joshua Strong           Date of Birth: [birth year]           MRN: [id number] Visit Date: 12/07/2018              Requested by: Sasser, Silvestre Moment, MD [place], [place] [address] PCP: [name], MD   Assessment & Plan: Visit Diagnoses:  1. History of total right knee replacement   2. Chronic pain of left knee   3. Unilateral primary osteoarthritis, left knee     Plan: I did try an injection of a steroid in his left knee per his request.  He is to work on quad strengthening exercises for both knees.  I did provide a small injection around the painful areas on his right knee to see if that will help.  All question concerns were answered and addressed.  Follow-up as otherwise as needed.  Follow-Up Instructions: Return if symptoms worsen or fail to improve.   Orders:  Orders Placed This Encounter  Procedures  . Large Joint Inj  . Large Joint Inj  . XR Knee 1-2 Views Left  . XR Knee 1-2 Views Right   No orders of the defined types were placed in this encounter.     Procedures: Large Joint Inj: R knee on 12/07/2018 2:59 PM Indications: diagnostic evaluation and pain Details: 22 G 1.5 in needle, superolateral approach  Arthrogram: No  Medications: 3 mL lidocaine 1 %; 40 mg methylPREDNISolone acetate 40 MG/ML Outcome: tolerated well, no immediate complications Procedure, treatment alternatives, risks and benefits explained, specific risks discussed. Consent was given by the patient. Immediately prior to procedure a time out was called to verify the correct patient, procedure, equipment, support staff and site/side marked as required. Patient was prepped and draped in the usual sterile fashion.   Large Joint Inj: L knee on 12/07/2018 2:59 PM Indications: diagnostic evaluation and pain Details: 22 G 1.5 in needle, superolateral approach  Arthrogram: No  Medications: 3 mL lidocaine 1 %; 40 mg methylPREDNISolone acetate 40 MG/ML Outcome: tolerated  well, no immediate complications Procedure, treatment alternatives, risks and benefits explained, specific risks discussed. Consent was given by the patient. Immediately prior to procedure a time out was called to verify the correct patient, procedure, equipment, support staff and site/side marked as required. Patient was prepped and draped in the usual sterile fashion.       Clinical Data: No additional findings.   Subjective: Chief Complaint  Patient presents with  . Left Knee - Pain  . Right Knee - Pain  Patient comes in today with bilateral knee pain with a known history of a right total knee arthroplasty 2 years ago.  He is a very active 85 year old gentleman.  He points to the lateral quad tendon and the lateral knee is source of his pain he has been having left knee pain with activities and mainly that of the medial joint space.  A year ago he had a Monovisc hyaluronic acid injection in the left knee.  He says his right operative knee bothers him only going up and down stairs especially going downstairs.  He denies any other active medical problems.  He is not a diabetic and he denies any knee swelling  HPI  Review of Systems He currently denies any headache, chest pain, shortness of breath, fever, chills, nausea, vomiting  Objective: Vital Signs: There were no vitals taken for this  visit.  Physical Exam He is alert and orient x3 and in no acute distress Ortho Exam Examination of his right operative knee shows a well-healed surgical incision.  The knee is ligamentously stable has full range of motion.  He hurts on the lateral quad tendon at the patella and just at the lateral tibial plateau.  The implant is not overhanging.  His left knee has medial joint line tenderness with full range of motion and slight varus malalignment. Specialty Comments:  No specialty comments available.  Imaging: Xr Knee 1-2 Views Left  Result Date: 12/07/2018 3 views of the left knee show joint  space narrowing with osteopenic bone.  There is significant patellofemoral arthritis and worsening medial compartment narrowing.  Xr Knee 1-2 Views Right  Result Date: 12/07/2018 2 views of the right knee show a total knee arthroplasty without any evidence of loosening or complicating features.  The alignment is neutral.    PMFS History: Patient Active Problem List   Diagnosis Date Noted  . Presence of right artificial knee joint 05/20/2017  . Unilateral primary osteoarthritis, left knee 05/20/2017  . History of total right knee replacement 02/17/2017  . Chronic pain of left knee 02/17/2017  . Osteoarthritis of right knee 05/24/2016  . Status post total right knee replacement 05/24/2016  . Diverticulosis of colon without hemorrhage   . CONSTIPATION 04/19/2010  . ABDOMINAL PAIN, LEFT LOWER QUADRANT 09/18/2009  . EPIGASTRIC PAIN 09/18/2009  . CARCINOMA, RENAL CELL 08/08/2008  . PNEUMONIA 08/08/2008  . ACUTE PANCREATITIS 08/08/2008  . NAUSEA 08/08/2008  . ABDOMINAL PAIN, CHRONIC 08/08/2008   Past Medical History:  Diagnosis Date  . GERD (gastroesophageal reflux disease)   . GI bleed    ulcer  . Hemorrhoid   . Pancreatitis   . Pneumonia   . Renal cell carcinoma ([place])   . Tubular adenoma   . Tubular adenoma 2013    Family History  Adopted: Yes    Past Surgical History:  Procedure Laterality Date  . CHOLECYSTECTOMY    . COLONOSCOPY  06/16/2007   Dr. Gala Romney- minimal internal hemorrhoids o/w normal rectum,tubular adenoma  . COLONOSCOPY  07/02/2012   Dr.Rourk- normal rectum, 3 5-7 mm peduncuated polyos, one in the mid sigmoid segment and 2 in the ascending segment. remainder of the colonic mucosa appeared normal. bx= tubular adenoma  . COLONOSCOPY N/A 07/17/2015   Procedure: COLONOSCOPY;  Surgeon: Daneil Dolin, MD;  [facility];  Service: Endoscopy;  Laterality: N/A;  730   . EYE SURGERY     cataract with lens implants  . HAND SURGERY    . KNEE SURGERY      right  . NEPHRECTOMY Right   . pancreatic duct stent    . SHOULDER SURGERY     left  . SPINAL FUSION    . testicular tumor    . TOTAL KNEE ARTHROPLASTY Right 05/24/2016   Procedure: RIGHT TOTAL KNEE ARTHROPLASTY;  Surgeon: Mcarthur Rossetti, MD;  Location: [facility];  Service: Orthopedics;  Laterality: Right;   Social History   Occupational History  . Not on file  Tobacco Use  . Smoking status: Former Smoker    Packs/day: 0.50    Years: 3.00    Pack years: 1.50    Types: Cigarettes    Last attempt to quit: 05/17/1956    Years since quitting: 62.6  . Smokeless tobacco: Never Used  . Tobacco comment: quit in 1958  Substance and Sexual Activity  . Alcohol use:  No  . Drug use: No  . Sexual activity: Not on file

## 2018-12-16 DIAGNOSIS — S199XXA Unspecified injury of neck, initial encounter: Secondary | ICD-10-CM | POA: Diagnosis not present

## 2018-12-16 DIAGNOSIS — M25512 Pain in left shoulder: Secondary | ICD-10-CM | POA: Diagnosis not present

## 2018-12-16 DIAGNOSIS — Z79899 Other long term (current) drug therapy: Secondary | ICD-10-CM | POA: Diagnosis not present

## 2018-12-16 DIAGNOSIS — S52502A Unspecified fracture of the lower end of left radius, initial encounter for closed fracture: Secondary | ICD-10-CM | POA: Diagnosis not present

## 2018-12-16 DIAGNOSIS — W1839XA Other fall on same level, initial encounter: Secondary | ICD-10-CM | POA: Diagnosis not present

## 2018-12-16 DIAGNOSIS — R52 Pain, unspecified: Secondary | ICD-10-CM | POA: Diagnosis not present

## 2018-12-16 DIAGNOSIS — M542 Cervicalgia: Secondary | ICD-10-CM | POA: Diagnosis not present

## 2018-12-16 DIAGNOSIS — R42 Dizziness and giddiness: Secondary | ICD-10-CM | POA: Diagnosis not present

## 2018-12-16 DIAGNOSIS — R51 Headache: Secondary | ICD-10-CM | POA: Diagnosis not present

## 2018-12-16 DIAGNOSIS — Z87891 Personal history of nicotine dependence: Secondary | ICD-10-CM | POA: Diagnosis not present

## 2018-12-16 DIAGNOSIS — M25552 Pain in left hip: Secondary | ICD-10-CM | POA: Diagnosis not present

## 2018-12-16 DIAGNOSIS — T07XXXA Unspecified multiple injuries, initial encounter: Secondary | ICD-10-CM | POA: Diagnosis not present

## 2018-12-16 DIAGNOSIS — I4510 Unspecified right bundle-branch block: Secondary | ICD-10-CM | POA: Diagnosis not present

## 2018-12-16 DIAGNOSIS — S0990XA Unspecified injury of head, initial encounter: Secondary | ICD-10-CM | POA: Diagnosis not present

## 2018-12-16 DIAGNOSIS — R55 Syncope and collapse: Secondary | ICD-10-CM | POA: Diagnosis not present

## 2018-12-16 DIAGNOSIS — S52612A Displaced fracture of left ulna styloid process, initial encounter for closed fracture: Secondary | ICD-10-CM | POA: Diagnosis not present

## 2018-12-16 DIAGNOSIS — R1030 Lower abdominal pain, unspecified: Secondary | ICD-10-CM | POA: Diagnosis not present

## 2018-12-16 DIAGNOSIS — R102 Pelvic and perineal pain: Secondary | ICD-10-CM | POA: Diagnosis not present

## 2018-12-16 DIAGNOSIS — N189 Chronic kidney disease, unspecified: Secondary | ICD-10-CM | POA: Diagnosis not present

## 2018-12-16 DIAGNOSIS — S52592A Other fractures of lower end of left radius, initial encounter for closed fracture: Secondary | ICD-10-CM | POA: Diagnosis not present

## 2018-12-16 DIAGNOSIS — I10 Essential (primary) hypertension: Secondary | ICD-10-CM | POA: Diagnosis not present

## 2018-12-18 DIAGNOSIS — I059 Rheumatic mitral valve disease, unspecified: Secondary | ICD-10-CM | POA: Diagnosis not present

## 2018-12-18 DIAGNOSIS — I700 Atherosclerosis of aorta: Secondary | ICD-10-CM | POA: Diagnosis not present

## 2018-12-18 DIAGNOSIS — R55 Syncope and collapse: Secondary | ICD-10-CM | POA: Diagnosis not present

## 2018-12-21 DIAGNOSIS — S52502A Unspecified fracture of the lower end of left radius, initial encounter for closed fracture: Secondary | ICD-10-CM | POA: Diagnosis not present

## 2019-01-19 DIAGNOSIS — K210 Gastro-esophageal reflux disease with esophagitis: Secondary | ICD-10-CM | POA: Diagnosis not present

## 2019-01-19 DIAGNOSIS — N183 Chronic kidney disease, stage 3 (moderate): Secondary | ICD-10-CM | POA: Diagnosis not present

## 2019-01-19 DIAGNOSIS — E7800 Pure hypercholesterolemia, unspecified: Secondary | ICD-10-CM | POA: Diagnosis not present

## 2019-01-21 DIAGNOSIS — S52502D Unspecified fracture of the lower end of left radius, subsequent encounter for closed fracture with routine healing: Secondary | ICD-10-CM | POA: Diagnosis not present

## 2019-01-22 ENCOUNTER — Telehealth: Payer: Self-pay | Admitting: Cardiovascular Disease

## 2019-01-22 DIAGNOSIS — R42 Dizziness and giddiness: Secondary | ICD-10-CM | POA: Diagnosis not present

## 2019-01-22 DIAGNOSIS — R7301 Impaired fasting glucose: Secondary | ICD-10-CM | POA: Diagnosis not present

## 2019-01-22 DIAGNOSIS — N183 Chronic kidney disease, stage 3 (moderate): Secondary | ICD-10-CM | POA: Diagnosis not present

## 2019-01-22 DIAGNOSIS — R5383 Other fatigue: Secondary | ICD-10-CM | POA: Diagnosis not present

## 2019-01-22 DIAGNOSIS — R51 Headache: Secondary | ICD-10-CM | POA: Diagnosis not present

## 2019-01-22 DIAGNOSIS — Z6825 Body mass index (BMI) 25.0-25.9, adult: Secondary | ICD-10-CM | POA: Diagnosis not present

## 2019-01-22 DIAGNOSIS — E782 Mixed hyperlipidemia: Secondary | ICD-10-CM | POA: Diagnosis not present

## 2019-01-22 DIAGNOSIS — R296 Repeated falls: Secondary | ICD-10-CM | POA: Diagnosis not present

## 2019-01-22 NOTE — Telephone Encounter (Signed)
Pt gave consent. ° °YOUR CARDIOLOGY TEAM HAS ARRANGED FOR AN E-VISIT FOR YOUR APPOINTMENT - PLEASE REVIEW IMPORTANT INFORMATION BELOW SEVERAL DAYS PRIOR TO YOUR APPOINTMENT ° °Due to the recent COVID-19 pandemic, we are transitioning in-person office visits to tele-medicine visits in an effort to decrease unnecessary exposure to our patients and staff. Medicare and most insurances are covering these visits without a copay needed. We also encourage you to sign up for MyChart if you have not already done so. You will need a smartphone if possible. For patients that do not have this, we can still complete the visit using a regular telephone but do prefer a smartphone to enable video when possible. You may have a close family member that lives with you that can help. If possible, we also ask that you have a blood pressure cuff and scale at home to measure your blood pressure, heart rate and weight prior to your scheduled appointment. Patients with clinical needs that need an in-person evaluation and testing will still be able to come to the office if absolutely necessary. If you have any questions, feel free to call our office. ° ° ° °IF YOU HAVE A SMARTPHONE, PLEASE DOWNLOAD THE WEBEX APP TO YOUR SMARTPHONE ° °- If Apple, go to App Store and type in WebEx in the search bar. Download [employer] Webex Meetings, the blue/green circle. The app is free but as with any other app download, your phone may require you to verify saved payment information or Apple password. You do NOT have to create a WebEx account. ° °- If Android, go to Google Play Store and type in WebEx in the search bar. Download [employer] Webex Meetings, the blue/green circle. The app is free but as with any other app download, your phone may require you to verify saved payment information or Android password. You do NOT have to create a WebEx account. ° °It is very helpful to have this downloaded before your visit. ° ° ° °2-3 DAYS BEFORE YOUR APPOINTMENT ° °You  will receive a telephone call from one of our [facility] team members - your caller ID may say "Unknown caller." If this is a video visit, we will confirm that you have been able to download the WebEx app. We will remind you check your blood pressure, heart rate and weight prior to your scheduled appointment. If you have an Apple Watch or Kardia, please upload any pertinent ECG strips the day before or morning of your appointment to MyChart. Our staff will also make sure you have reviewed the consent and agree to move forward with your scheduled tele-health visit.  ° ° ° °THE DAY OF YOUR APPOINTMENT ° °Approximately 15 minutes prior to your scheduled appointment, you will receive a telephone call from one of [facility] team - your caller ID may say "Unknown caller."  Our staff will confirm medications, vital signs for the day and any symptoms you may be experiencing. Please have this information available prior to the time of visit start. It may also be helpful for you to have a pad of paper and pen handy for any instructions given during your visit. They will also walk you through joining the WebEx smartphone meeting if this is a video visit. ° ° ° °CONSENT FOR TELE-HEALTH VISIT - PLEASE REVIEW ° °I hereby voluntarily request, consent and authorize [facility] and its employed or contracted physicians, physician assistants, nurse practitioners or other licensed health care professionals (the Practitioner), to provide me with telemedicine health care   services (the “Services") as deemed necessary by the treating Practitioner. I acknowledge and consent to receive the Services by the Practitioner via telemedicine. I understand that the telemedicine visit will involve communicating with the Practitioner through live audiovisual communication technology and the disclosure of certain medical information by electronic transmission. I acknowledge that I have been given the opportunity to request an in-person assessment or  other available alternative prior to the telemedicine visit and am voluntarily participating in the telemedicine visit. ° °I understand that I have the right to withhold or withdraw my consent to the use of telemedicine in the course of my care at any time, without affecting my right to future care or treatment, and that the Practitioner or I may terminate the telemedicine visit at any time. I understand that I have the right to inspect all information obtained and/or recorded in the course of the telemedicine visit and may receive copies of available information for a reasonable fee.  I understand that some of the potential risks of receiving the Services via telemedicine include:  °• Delay or interruption in medical evaluation due to technological equipment failure or disruption; °• Information transmitted may not be sufficient (e.g. poor resolution of images) to allow for appropriate medical decision making by the Practitioner; and/or  °• In rare instances, security protocols could fail, causing a breach of personal health information. ° °Furthermore, I acknowledge that it is my responsibility to provide information about my medical history, conditions and care that is complete and accurate to the best of my ability. I acknowledge that Practitioner's advice, recommendations, and/or decision may be based on factors not within their control, such as incomplete or inaccurate data provided by me or distortions of diagnostic images or specimens that may result from electronic transmissions. I understand that the practice of medicine is not an exact science and that Practitioner makes no warranties or guarantees regarding treatment outcomes. I acknowledge that I will receive a copy of this consent concurrently upon execution via email to the email address I last provided but may also request a printed copy by calling the office of [facility].   ° °I understand that my insurance will be billed for this visit.  ° °I  have read or had this consent read to me. °• I understand the contents of this consent, which adequately explains the benefits and risks of the Services being provided via telemedicine.  °• I have been provided ample opportunity to ask questions regarding this consent and the Services and have had my questions answered to my satisfaction. °• I give my informed consent for the services to be provided through the use of telemedicine in my medical care ° °By participating in this telemedicine visit I agree to the above. ° °

## 2019-01-31 NOTE — Progress Notes (Signed)
Virtual Visit via Video Note   This visit type was conducted due to national recommendations for restrictions regarding the COVID-19 Pandemic (e.g. social distancing) in an effort to limit this patient's exposure and mitigate transmission in our community.  Due to his co-morbid illnesses, this patient is at least at moderate risk for complications without adequate follow up.  This format is felt to be most appropriate for this patient at this time.  All issues noted in this document were discussed and addressed.  A limited physical exam was performed with this format.  Please refer to the patient's chart for his consent to telehealth for [facility].   Date:  02/02/2019   ID:  Joshua Strong, DOB Feb 04, 1931, MRN [id number]  Patient Location: Home Provider Location: Office  PCP:  Joshua Hilding, MD  Cardiologist:   New/Joshua Strong Electrophysiologist:  None   Evaluation Performed:  New Patient Evaluation  Chief Complaint:  Syncope/Dizziness  History of Present Illness:    Joshua Strong is a 85 y.o. male with with no previous cardiac disease Post nephrectomy for renal cell. Has had weakness and postural symptoms ongoing for past year. Recent fall with arm injury No cardiac history Some depression. Referred by Joshua Strong for ? Syncope Balance is poor previous right TKR and recent injection of left knee for osteoarthritis     Lab review BUN 22 CR 1.58 K 4.6 LDL 117 ECG 01/22/19 SR RBBB PR 214 msec  Fall was going up stairs usually falls backwards Never has palpitations, dyspnea or chest pain before dizziness  The patient does not have symptoms concerning for COVID-19 infection (fever, chills, cough, or new shortness of breath).    Past Medical History:  Diagnosis Date  . GERD (gastroesophageal reflux disease)   . GI bleed    ulcer  . Hemorrhoid   . Pancreatitis   . Pneumonia   . Renal cell carcinoma ([place])   . Tubular adenoma   . Tubular adenoma 2013   Past Surgical History:  Procedure  Laterality Date  . CHOLECYSTECTOMY    . COLONOSCOPY  06/16/2007   Joshua. Gala Romney- minimal internal hemorrhoids o/w normal rectum,tubular adenoma  . COLONOSCOPY  07/02/2012   Joshua.Rourk- normal rectum, 3 5-7 mm peduncuated polyos, one in the mid sigmoid segment and 2 in the ascending segment. remainder of the colonic mucosa appeared normal. bx= tubular adenoma  . COLONOSCOPY N/A 07/17/2015   Procedure: COLONOSCOPY;  Surgeon: Daneil Dolin, MD;  [facility];  Service: Endoscopy;  Laterality: N/A;  730   . EYE SURGERY     cataract with lens implants  . HAND SURGERY    . KNEE SURGERY     right  . NEPHRECTOMY Right   . pancreatic duct stent    . SHOULDER SURGERY     left  . SPINAL FUSION    . testicular tumor    . TOTAL KNEE ARTHROPLASTY Right 05/24/2016   Procedure: RIGHT TOTAL KNEE ARTHROPLASTY;  Surgeon: Mcarthur Rossetti, MD;  Location: [facility];  Service: Orthopedics;  Laterality: Right;     Current Meds  Medication Sig  . acetaminophen (TYLENOL) 500 MG tablet Take 500 mg by mouth every 6 (six) hours as needed for mild pain.  Marland Kitchen Apoaequorin (PREVAGEN PO) Take by mouth daily.  Marland Kitchen aspirin 81 MG chewable tablet Chew 81 mg by mouth daily.  . calcium citrate-vitamin D (CITRACAL+D) 315-200 MG-UNIT per tablet Take 1 tablet by mouth daily.  . diazepam (VALIUM)  5 MG tablet Take 1 by mouth 1 to 2 hours pre-procedure. May repeat if necessary.  . Multiple Vitamin (MULTIVITAMIN) tablet Take 1 tablet by mouth daily.  Marland Kitchen OVER THE COUNTER MEDICATION Super Beta Prostate twice daily  . pantoprazole (PROTONIX) 20 MG tablet Take 1 tablet (20 mg total) by mouth daily.  Marland Kitchen PRESCRIPTION MEDICATION Injection every 6 months for osteoporosis (receives at [facility])  . Teriparatide, Recombinant, (FORTEO) 600 MCG/2.4ML SOLN Inject 20 mcg into the skin daily.  . [DISCONTINUED] aspirin EC 325 MG EC tablet Take 1 tablet (325 mg total) by mouth 2 (two) times daily after a meal.  . [DISCONTINUED] pantoprazole  (PROTONIX) 40 MG tablet Take by mouth.  . [DISCONTINUED] promethazine (PHENERGAN) 25 MG tablet Take 25 mg by mouth every 8 (eight) hours as needed for nausea.   . [DISCONTINUED] traMADol (ULTRAM) 50 MG tablet Take 1-2 tablets (50-100 mg total) by mouth every 6 (six) hours as needed for moderate pain.     Allergies:   Morphine and related and Metoclopramide hcl   Social History   Tobacco Use  . Smoking status: Former Smoker    Packs/day: 0.50    Years: 3.00    Pack years: 1.50    Types: Cigarettes    Last attempt to quit: 05/17/1956    Years since quitting: 62.7  . Smokeless tobacco: Never Used  . Tobacco comment: quit in 1958  Substance Use Topics  . Alcohol use: No  . Drug use: No     Family Hx: The patient's family history is not on file. He was adopted.  ROS:   Please see the history of present illness.     All other systems reviewed and are negative.   Prior CV studies:   The following studies were reviewed today:  Notes from primary ECG and labs  Notes orthopedics Joshua Strong   Labs/Other Tests and Data Reviewed:    EKG:   See HPI  Recent Labs: 10/27/2018: Creat 1.59   Recent Lipid Panel No results found for: CHOL, TRIG, HDL, CHOLHDL, LDLCALC, LDLDIRECT  Wt Readings from Last 3 Encounters:  02/02/19 70.3 kg  10/27/18 74.6 kg  05/24/16 75.3 kg     Objective:    Vital Signs:  Ht 5\' 6"  (1.676 m)   Wt 70.3 kg   BMI 25.02 kg/m    Frail elderly male No distress No JVP elevation No tachypnea Post right TKR No edema  ASSESSMENT & PLAN:    1. Syncope:  Doubt cardiac etiology may have some postural hypotension due to age but cannot check with tele medicine visit.  Episodes almost certainly inner ear mediated  Can f/u with primary for this. ECG with borderline long PR 214 msec and RBBB will check echo to r/o structural heart disease and 48 hour monitor to assess average HR and r/o intermittent AV block  2. Renal Cell:  Post nephrectomy Cr stable around  1.6 3. Ortho:  Post right TKR chronic left knee osteoarthritis f/u Joshua Strong has had injections  4. Depression:  F/u primary not on SSRI or other QT prolonging drugs  COVID-19 Education: The signs and symptoms of COVID-19 were discussed with the patient and how to seek care for testing (follow up with PCP or arrange E-visit).  The importance of social distancing was discussed today.  Time:   Today, I have spent 40 minutes with the patient with telehealth technology discussing the above problems.     Medication Adjustments/Labs and Tests Ordered: Current medicines  are reviewed at length with the patient today.  Concerns regarding medicines are outlined above.   Tests Ordered:  Echo 48 hours montior   Medication Changes: No orders of the defined types were placed in this encounter.   Disposition:  Follow up prn  Signed, Jenkins Rouge, MD  02/02/2019 10:52 AM    [place]

## 2019-02-02 ENCOUNTER — Telehealth: Payer: Self-pay

## 2019-02-02 ENCOUNTER — Encounter: Payer: Self-pay | Admitting: Cardiovascular Disease

## 2019-02-02 ENCOUNTER — Telehealth (INDEPENDENT_AMBULATORY_CARE_PROVIDER_SITE_OTHER): Payer: Medicare Other | Admitting: Cardiovascular Disease

## 2019-02-02 VITALS — Ht 66.0 in | Wt 155.0 lb

## 2019-02-02 DIAGNOSIS — R55 Syncope and collapse: Secondary | ICD-10-CM

## 2019-02-02 NOTE — Progress Notes (Signed)
Medication Instructions:  Your physician recommends that you continue on your current medications as directed. Please refer to the Current Medication list given to you today.   Labwork: none  Testing/Procedures: Your physician has requested that you have an echocardiogram. Echocardiography is a painless test that uses sound waves to create images of your heart. It provides your doctor with information about the size and shape of your heart and how well your heart's chambers and valves are working. This procedure takes approximately one hour. There are no restrictions for this procedure.  Your physician has recommended that you wear a holter monitor. Holter monitors are medical devices that record the heart's electrical activity. Doctors most often use these monitors to diagnose arrhythmias. Arrhythmias are problems with the speed or rhythm of the heartbeat. The monitor is a small, portable device. You can wear one while you do your normal daily activities. This is usually used to diagnose what is causing palpitations/syncope (passing out).    Follow-Up:Your physician recommends that you schedule a follow-up appointment in: as needed    Any Other Special Instructions Will Be Listed Below (If Applicable).     If you need a refill on your cardiac medications before your next appointment, please call your pharmacy.

## 2019-02-02 NOTE — Telephone Encounter (Signed)
-----   Message from Drema Dallas, [place] sent at 02/02/2019 10:59 AM EDT ----- Regarding: 48 HOUR HOLTER Good morning.  Could you please put order in preventice for 48 hr holter.  As I cannot access preventice from home.    LM

## 2019-02-02 NOTE — Addendum Note (Signed)
Addended by: Debbora Lacrosse R on: 02/02/2019 10:57 AM   Modules accepted: Orders

## 2019-02-05 ENCOUNTER — Ambulatory Visit (INDEPENDENT_AMBULATORY_CARE_PROVIDER_SITE_OTHER): Payer: Medicare Other

## 2019-02-05 ENCOUNTER — Telehealth: Payer: Self-pay | Admitting: Cardiovascular Disease

## 2019-02-05 DIAGNOSIS — R55 Syncope and collapse: Secondary | ICD-10-CM

## 2019-02-05 NOTE — Telephone Encounter (Signed)
Patient would like to know if he should wait until after the echo to do his monitor or does he need to go ahead and put it on. He has received it in the mail.

## 2019-02-05 NOTE — Telephone Encounter (Signed)
Called patient and informed him to start wearing the monitor. Patient verbalized understanding.

## 2019-02-09 ENCOUNTER — Telehealth: Payer: Self-pay | Admitting: Cardiovascular Disease

## 2019-02-09 NOTE — Telephone Encounter (Signed)
IT was placed in preventice as well, pt should be getting it soon.

## 2019-02-09 NOTE — Telephone Encounter (Signed)
Pt needs a 48 hr holter, he was scheduled in error for [facility] w/ Silva Bandy

## 2019-02-15 ENCOUNTER — Other Ambulatory Visit: Payer: Self-pay

## 2019-02-16 ENCOUNTER — Ambulatory Visit (HOSPITAL_COMMUNITY)
Admission: RE | Admit: 2019-02-16 | Discharge: 2019-02-16 | Disposition: A | Discharge status: Home / Self Care | Payer: Medicare Other | Source: Ambulatory Visit | Attending: Cardiovascular Disease | Admitting: Cardiovascular Disease

## 2019-02-16 ENCOUNTER — Encounter (HOSPITAL_COMMUNITY): Payer: Medicare Other

## 2019-02-16 ENCOUNTER — Other Ambulatory Visit: Payer: Self-pay

## 2019-02-16 DIAGNOSIS — R55 Syncope and collapse: Secondary | ICD-10-CM | POA: Insufficient documentation

## 2019-02-16 NOTE — Progress Notes (Signed)
*  PRELIMINARY RESULTS* Echocardiogram 2D Echocardiogram has been performed.  Joshua Strong 02/16/2019, 10:06 AM

## 2019-03-22 ENCOUNTER — Ambulatory Visit (INDEPENDENT_AMBULATORY_CARE_PROVIDER_SITE_OTHER): Payer: Medicare Other | Admitting: Otolaryngology

## 2019-03-22 DIAGNOSIS — R42 Dizziness and giddiness: Secondary | ICD-10-CM

## 2019-03-22 DIAGNOSIS — H6983 Other specified disorders of Eustachian tube, bilateral: Secondary | ICD-10-CM | POA: Diagnosis not present

## 2019-03-22 DIAGNOSIS — H903 Sensorineural hearing loss, bilateral: Secondary | ICD-10-CM

## 2019-03-24 DIAGNOSIS — H903 Sensorineural hearing loss, bilateral: Secondary | ICD-10-CM | POA: Diagnosis not present

## 2019-03-24 DIAGNOSIS — R42 Dizziness and giddiness: Secondary | ICD-10-CM | POA: Diagnosis not present

## 2019-03-30 DIAGNOSIS — R2689 Other abnormalities of gait and mobility: Secondary | ICD-10-CM | POA: Diagnosis not present

## 2019-03-30 DIAGNOSIS — R42 Dizziness and giddiness: Secondary | ICD-10-CM | POA: Diagnosis not present

## 2019-04-06 DIAGNOSIS — R42 Dizziness and giddiness: Secondary | ICD-10-CM | POA: Diagnosis not present

## 2019-04-06 DIAGNOSIS — R2689 Other abnormalities of gait and mobility: Secondary | ICD-10-CM | POA: Diagnosis not present

## 2019-04-13 DIAGNOSIS — R2689 Other abnormalities of gait and mobility: Secondary | ICD-10-CM | POA: Diagnosis not present

## 2019-04-13 DIAGNOSIS — R42 Dizziness and giddiness: Secondary | ICD-10-CM | POA: Diagnosis not present

## 2019-04-20 DIAGNOSIS — R2689 Other abnormalities of gait and mobility: Secondary | ICD-10-CM | POA: Diagnosis not present

## 2019-04-20 DIAGNOSIS — R42 Dizziness and giddiness: Secondary | ICD-10-CM | POA: Diagnosis not present

## 2019-04-26 DIAGNOSIS — R2689 Other abnormalities of gait and mobility: Secondary | ICD-10-CM | POA: Diagnosis not present

## 2019-04-26 DIAGNOSIS — R42 Dizziness and giddiness: Secondary | ICD-10-CM | POA: Diagnosis not present

## 2019-05-03 DIAGNOSIS — R2689 Other abnormalities of gait and mobility: Secondary | ICD-10-CM | POA: Diagnosis not present

## 2019-05-03 DIAGNOSIS — R42 Dizziness and giddiness: Secondary | ICD-10-CM | POA: Diagnosis not present

## 2019-05-27 DIAGNOSIS — R296 Repeated falls: Secondary | ICD-10-CM | POA: Diagnosis not present

## 2019-05-27 DIAGNOSIS — R51 Headache: Secondary | ICD-10-CM | POA: Diagnosis not present

## 2019-05-27 DIAGNOSIS — R42 Dizziness and giddiness: Secondary | ICD-10-CM | POA: Diagnosis not present

## 2019-05-27 DIAGNOSIS — N183 Chronic kidney disease, stage 3 (moderate): Secondary | ICD-10-CM | POA: Diagnosis not present

## 2019-05-27 DIAGNOSIS — R5383 Other fatigue: Secondary | ICD-10-CM | POA: Diagnosis not present

## 2019-05-27 DIAGNOSIS — Z6824 Body mass index (BMI) 24.0-24.9, adult: Secondary | ICD-10-CM | POA: Diagnosis not present

## 2019-05-27 DIAGNOSIS — E782 Mixed hyperlipidemia: Secondary | ICD-10-CM | POA: Diagnosis not present

## 2019-05-27 DIAGNOSIS — R233 Spontaneous ecchymoses: Secondary | ICD-10-CM | POA: Diagnosis not present

## 2019-05-27 DIAGNOSIS — R7301 Impaired fasting glucose: Secondary | ICD-10-CM | POA: Diagnosis not present

## 2019-06-22 DIAGNOSIS — M810 Age-related osteoporosis without current pathological fracture: Secondary | ICD-10-CM | POA: Diagnosis not present

## 2019-06-23 DIAGNOSIS — Z9181 History of falling: Secondary | ICD-10-CM | POA: Diagnosis not present

## 2019-06-23 DIAGNOSIS — M810 Age-related osteoporosis without current pathological fracture: Secondary | ICD-10-CM | POA: Diagnosis not present

## 2019-06-23 DIAGNOSIS — Z87891 Personal history of nicotine dependence: Secondary | ICD-10-CM | POA: Diagnosis not present

## 2019-06-23 DIAGNOSIS — Z79899 Other long term (current) drug therapy: Secondary | ICD-10-CM | POA: Diagnosis not present

## 2019-06-23 DIAGNOSIS — K219 Gastro-esophageal reflux disease without esophagitis: Secondary | ICD-10-CM | POA: Diagnosis not present

## 2019-06-23 DIAGNOSIS — M353 Polymyalgia rheumatica: Secondary | ICD-10-CM | POA: Diagnosis not present

## 2019-06-23 DIAGNOSIS — N289 Disorder of kidney and ureter, unspecified: Secondary | ICD-10-CM | POA: Diagnosis not present

## 2019-06-23 DIAGNOSIS — Z85528 Personal history of other malignant neoplasm of kidney: Secondary | ICD-10-CM | POA: Diagnosis not present

## 2019-07-05 DIAGNOSIS — Z23 Encounter for immunization: Secondary | ICD-10-CM | POA: Diagnosis not present

## 2019-07-12 DIAGNOSIS — R251 Tremor, unspecified: Secondary | ICD-10-CM | POA: Diagnosis not present

## 2019-07-12 DIAGNOSIS — Z6825 Body mass index (BMI) 25.0-25.9, adult: Secondary | ICD-10-CM | POA: Diagnosis not present

## 2019-07-12 DIAGNOSIS — R42 Dizziness and giddiness: Secondary | ICD-10-CM | POA: Diagnosis not present

## 2019-07-12 DIAGNOSIS — R55 Syncope and collapse: Secondary | ICD-10-CM | POA: Diagnosis not present

## 2019-07-12 DIAGNOSIS — R413 Other amnesia: Secondary | ICD-10-CM | POA: Diagnosis not present

## 2019-08-02 DIAGNOSIS — Z23 Encounter for immunization: Secondary | ICD-10-CM | POA: Diagnosis not present

## 2019-08-10 ENCOUNTER — Encounter: Payer: Self-pay | Admitting: *Deleted

## 2019-08-11 ENCOUNTER — Ambulatory Visit (INDEPENDENT_AMBULATORY_CARE_PROVIDER_SITE_OTHER): Payer: Medicare Other | Admitting: Diagnostic Neuroimaging

## 2019-08-11 ENCOUNTER — Encounter: Payer: Self-pay | Admitting: *Deleted

## 2019-08-11 ENCOUNTER — Other Ambulatory Visit: Payer: Self-pay

## 2019-08-11 DIAGNOSIS — R413 Other amnesia: Secondary | ICD-10-CM | POA: Diagnosis not present

## 2019-08-11 DIAGNOSIS — G3281 Cerebellar ataxia in diseases classified elsewhere: Secondary | ICD-10-CM | POA: Diagnosis not present

## 2019-08-11 NOTE — Progress Notes (Signed)
[facility]  PATIENT: Joshua Strong DOB: 1931/03/19  REFERRING CLINICIAN: Sasser, P HISTORY FROM: patient  REASON FOR VISIT: new consult    HISTORICAL  CHIEF COMPLAINT:  Chief Complaint  Patient presents with  . Tremor, syncope, memory    rm 7 , New Pt, son-in-lawCoralyn Mark,  MMSE 26    HISTORY OF PRESENT ILLNESS:   85 year old male here for evaluation of memory loss.  Patient reports 1 to 2 years of progressive gait and balance difficulty, memory loss, lightheadedness.  Patient has been having lightheaded episodes when he stands up quickly.  He feels dizzy and faint like he might pass out.  He has fallen down and may have passed out with 1 of these episodes.  At other times he feels his gait and balance feels "off".  Also having some short-term memory problems and confusion.  ADLs not significant affected.    REVIEW OF SYSTEMS: Full 14 system review of systems performed and negative with exception of: As per HPI.  ALLERGIES: Allergies  Allergen Reactions  . Morphine And Related Other (See Comments)    Hallucinations.  . Metoclopramide Hcl Rash    HOME MEDICATIONS: Outpatient Medications Prior to Visit  Medication Sig Dispense Refill  . acetaminophen (TYLENOL) 500 MG tablet Take 500 mg by mouth every 6 (six) hours as needed for mild pain.    Marland Kitchen Apoaequorin (PREVAGEN PO) Take by mouth daily.    Marland Kitchen aspirin 81 MG chewable tablet Chew 81 mg by mouth daily.    . Multiple Vitamin (MULTIVITAMIN) tablet Take 1 tablet by mouth daily.    Marland Kitchen OVER THE COUNTER MEDICATION Super Beta Prostate twice daily    . polyethylene glycol (MIRALAX / GLYCOLAX) 17 g packet Take 17 g by mouth daily.    Marland Kitchen PRESCRIPTION MEDICATION Injection every 6 months for osteoporosis (receives at [facility])    . calcium citrate-vitamin D (CITRACAL+D) 315-200 MG-UNIT per tablet Take 1 tablet by mouth daily.    . diazepam (VALIUM) 5 MG tablet Take 1 by mouth 1 to 2 hours pre-procedure. May  repeat if necessary. 2 tablet 0  . pantoprazole (PROTONIX) 20 MG tablet Take 1 tablet (20 mg total) by mouth daily. 30 tablet 11  . Teriparatide, Recombinant, (FORTEO) 600 MCG/2.4ML SOLN Inject 20 mcg into the skin daily.     No facility-administered medications prior to visit.     PAST MEDICAL HISTORY: Past Medical History:  Diagnosis Date  . Arthritis   . Dizziness   . GERD (gastroesophageal reflux disease)   . GI bleed    ulcer  . Hemorrhoid   . Memory loss   . Pancreatitis   . Pneumonia   . Renal cell carcinoma ([place])   . Syncope   . Tremor   . Tubular adenoma 2013    PAST SURGICAL HISTORY: Past Surgical History:  Procedure Laterality Date  . APPENDECTOMY  1975  . [place]  . CHOLECYSTECTOMY  1975  . COLONOSCOPY  06/16/2007   Dr. Gala Romney- minimal internal hemorrhoids o/w normal rectum,tubular adenoma  . COLONOSCOPY  07/02/2012   Dr.Rourk- normal rectum, 3 5-7 mm peduncuated polyos, one in the mid sigmoid segment and 2 in the ascending segment. remainder of the colonic mucosa appeared normal. bx= tubular adenoma  . COLONOSCOPY N/A 07/17/2015   Procedure: COLONOSCOPY;  Surgeon: Daneil Dolin, MD;  Location: [facility];  Service: Endoscopy;  Laterality: N/A;  730   . EYE  SURGERY     cataract with lens implants  . HAND SURGERY Right 1989, 1991  . KNEE SURGERY Right 2000  . NEPHRECTOMY Right 2000  . pancreatic duct stent     mult times  . SHOULDER SURGERY Left 09/16/2011  . SPINAL FUSION    . testicular tumor  1989  . TOTAL KNEE ARTHROPLASTY Right 05/24/2016   Procedure: RIGHT TOTAL KNEE ARTHROPLASTY;  Surgeon: Mcarthur Rossetti, MD;  Location: [facility];  Service: Orthopedics;  Laterality: Right;    FAMILY HISTORY: Family History  Adopted: Yes  Problem Relation Age of Onset  . Cancer Mother     SOCIAL HISTORY: Social History   Socioeconomic History  . Marital status: Married    Spouse name: Chrys Racer  . Number of children: 3   . Years of education: Not on file  . Highest education level: Master's degree (e.g., MA, MS, MEng, MEd, MSW, MBA)  Occupational History    Comment: retired  Scientific laboratory technician  . Financial resource strain: Not on file  . Food insecurity    Worry: Not on file    Inability: Not on file  . Transportation needs    Medical: Not on file    Non-medical: Not on file  Tobacco Use  . Smoking status: Former Smoker    Packs/day: 1.00    Years: 3.00    Pack years: 3.00    Types: Cigarettes    Quit date: 05/17/1956    Years since quitting: 63.2  . Smokeless tobacco: Never Used  . Tobacco comment: quit in 1958  Substance and Sexual Activity  . Alcohol use: No    Comment: 08/11/19 "too much for 6 yrs", quit in 1958  . Drug use: No  . Sexual activity: Not on file  Lifestyle  . Physical activity    Days per week: Not on file    Minutes per session: Not on file  . Stress: Not on file  Relationships  . Social Herbalist on phone: Not on file    Gets together: Not on file    Attends religious service: Not on file    Active member of club or organization: Not on file    Attends meetings of clubs or organizations: Not on file    Relationship status: Not on file  . Intimate partner violence    Fear of current or ex partner: Not on file    Emotionally abused: Not on file    Physically abused: Not on file    Forced sexual activity: Not on file  Other Topics Concern  . Not on file  Social History Narrative   Lives with wife   Caffeine- decaf     PHYSICAL EXAM  GENERAL EXAM/CONSTITUTIONAL: Vitals:  Vitals:   08/11/19 1409  BP: (!) 157/77  Pulse: 61  Temp: 97.6 F (36.4 C)  Weight: 158 lb 12.8 oz (72 kg)  Height: 5\' 6"  (1.676 m)     Body mass index is 25.63 kg/m. Wt Readings from Last 3 Encounters:  08/11/19 158 lb 12.8 oz (72 kg)  02/02/19 155 lb (70.3 kg)  10/27/18 164 lb 6.4 oz (74.6 kg)     Patient is in no distress; well developed, nourished and groomed; neck  is supple  CARDIOVASCULAR:  Examination of carotid arteries is normal; no carotid bruits  Regular rate and rhythm, no murmurs  Examination of peripheral vascular system by observation and palpation is normal  EYES:  Ophthalmoscopic exam of optic discs  and posterior segments is normal; no papilledema or hemorrhages  No exam data present  MUSCULOSKELETAL:  Gait, strength, tone, movements noted in Neurologic exam below  NEUROLOGIC: MENTAL STATUS:  MMSE - Mini Mental State Exam 08/11/2019  Orientation to time 5  Orientation to Place 5  Registration 3  Attention/ Calculation 4  Recall 1  Language- name 2 objects 2  Language- repeat 0  Language- follow 3 step command 3  Language- read & follow direction 1  Write a sentence 1  Copy design 1  Total score 26    awake, alert, oriented to person, place and time  recent and remote memory intact  normal attention and concentration  language fluent, comprehension intact, naming intact  fund of knowledge appropriate  CRANIAL NERVE:   2nd - no papilledema on fundoscopic exam  2nd, 3rd, 4th, 6th - pupils equal and reactive to light, visual fields full to confrontation, extraocular muscles intact, no nystagmus  5th - facial sensation symmetric  7th - facial strength symmetric  8th - hearing intact  9th - palate elevates symmetrically, uvula midline  11th - shoulder shrug symmetric  12th - tongue protrusion midline  MOTOR:   normal bulk and tone, full strength in the BUE, BLE  SENSORY:   normal and symmetric to light touch, temperature, vibration  COORDINATION:   finger-nose-finger, fine finger movements normal  REFLEXES:   deep tendon reflexes TRACE and symmetric  GAIT/STATION:   WIDE based gait; UNSTEADY     DIAGNOSTIC DATA (LABS, IMAGING, TESTING) - I reviewed patient records, labs, notes, testing and imaging myself where available.  Lab Results  Component Value Date   WBC 11.2 (H)  05/25/2016   HGB 13.0 05/25/2016   HCT 39.2 05/25/2016   MCV 91.6 05/25/2016   PLT 184 05/25/2016      Component Value Date/Time   NA 138 05/25/2016 0415   K 4.7 05/25/2016 0415   CL 103 05/25/2016 0415   CO2 31 05/25/2016 0415   GLUCOSE 116 (H) 05/25/2016 0415   BUN 24 (H) 05/25/2016 0415   CREATININE 1.59 (H) 10/27/2018 1246   CALCIUM 9.0 05/25/2016 0415   GFRNONAA 40 (L) 05/25/2016 0415   GFRAA 46 (L) 05/25/2016 0415   No results found for: CHOL, HDL, LDLCALC, LDLDIRECT, TRIG, CHOLHDL No results found for: HGBA1C No results found for: VITAMINB12 No results found for: TSH   06/07/10 MRI brain - nothing acute - mild atrophy - mild chronic small vessel ischemic disease    ASSESSMENT AND PLAN  85 y.o. year old male here with memory loss, dizziness, lightheadedness and gait difficulty.  We will proceed with further work-up.  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 Memory loss     PLAN:  GAIT DIFFICULTY / DIZZINESS / MEMORY LOSS - check MRI brain (eval for stroke) - use cane / walker - continue PT exercises  Orders Placed This Encounter  Procedures  . MR BRAIN WO CONTRAST   Return pending testing, for pending if symptoms worsen or fail to improve.    Penni Bombard, MD [id number], [id number] PM Certified in Neurology, Neurophysiology and Neuroimaging  [facility] [address], [place], [place] [address] [phone]

## 2019-08-16 ENCOUNTER — Telehealth: Payer: Self-pay | Admitting: *Deleted

## 2019-08-16 NOTE — Telephone Encounter (Signed)
Pt would like to have valium prior to appt 09/09/2019. Pharmacy is updated.  Pt has a driver on appt date [phone].

## 2019-08-16 NOTE — Telephone Encounter (Signed)
Okay to repeat he will likely want Valium just send me a message back

## 2019-08-17 ENCOUNTER — Other Ambulatory Visit: Payer: Self-pay | Admitting: Physical Medicine and Rehabilitation

## 2019-08-17 DIAGNOSIS — F411 Generalized anxiety disorder: Secondary | ICD-10-CM

## 2019-08-17 MED ORDER — DIAZEPAM 5 MG PO TABS: ORAL_TABLET | ORAL | 0 refills | Status: DC

## 2019-08-17 NOTE — Telephone Encounter (Signed)
Done

## 2019-08-17 NOTE — Progress Notes (Signed)
Pre-procedure diazepam ordered for pre-operative anxiety.  

## 2019-08-17 NOTE — Telephone Encounter (Signed)
Called pt and lvm to advise that Rx has been sent in per Dr. Ernestina Patches.

## 2019-08-28 ENCOUNTER — Other Ambulatory Visit: Payer: Self-pay

## 2019-08-28 ENCOUNTER — Ambulatory Visit
Admission: RE | Admit: 2019-08-28 | Discharge: 2019-08-28 | Disposition: A | Discharge status: Home / Self Care | Payer: Medicare Other | Source: Ambulatory Visit | Attending: Diagnostic Neuroimaging | Admitting: Diagnostic Neuroimaging

## 2019-08-28 DIAGNOSIS — G3281 Cerebellar ataxia in diseases classified elsewhere: Secondary | ICD-10-CM | POA: Diagnosis not present

## 2019-08-28 DIAGNOSIS — R413 Other amnesia: Secondary | ICD-10-CM

## 2019-08-30 ENCOUNTER — Ambulatory Visit: Payer: Self-pay | Admitting: Neurology

## 2019-09-07 ENCOUNTER — Telehealth: Payer: Self-pay

## 2019-09-07 NOTE — Telephone Encounter (Signed)
-----   Message from Vikram R Penumalli, MD sent at 09/06/2019  5:37 PM EST ----- Unremarkable imaging results. Please call patient. Continue current plan. -VRP 

## 2019-09-07 NOTE — Telephone Encounter (Signed)
Left vm for patient to call back about MRI results of the brain.  ------

## 2019-09-07 NOTE — Telephone Encounter (Signed)
I called pt that the MRI was unremarkable. Continue treatment plan. Pt verbalized understanding. I stated a copy will be sent to Dr. Quintin Alto of the MRI.  ------

## 2019-09-09 ENCOUNTER — Other Ambulatory Visit: Payer: Self-pay

## 2019-09-09 ENCOUNTER — Encounter: Payer: Self-pay | Admitting: Physical Medicine and Rehabilitation

## 2019-09-09 ENCOUNTER — Ambulatory Visit (INDEPENDENT_AMBULATORY_CARE_PROVIDER_SITE_OTHER): Payer: Medicare Other | Admitting: Physical Medicine and Rehabilitation

## 2019-09-09 ENCOUNTER — Ambulatory Visit: Payer: Self-pay

## 2019-09-09 VITALS — BP 134/85 | HR 80

## 2019-09-09 DIAGNOSIS — M48062 Spinal stenosis, lumbar region with neurogenic claudication: Secondary | ICD-10-CM | POA: Diagnosis not present

## 2019-09-09 DIAGNOSIS — M25561 Pain in right knee: Secondary | ICD-10-CM

## 2019-09-09 DIAGNOSIS — M5416 Radiculopathy, lumbar region: Secondary | ICD-10-CM | POA: Diagnosis not present

## 2019-09-09 DIAGNOSIS — G8929 Other chronic pain: Secondary | ICD-10-CM | POA: Diagnosis not present

## 2019-09-09 DIAGNOSIS — R2689 Other abnormalities of gait and mobility: Secondary | ICD-10-CM

## 2019-09-09 DIAGNOSIS — Z96651 Presence of right artificial knee joint: Secondary | ICD-10-CM

## 2019-09-09 MED ORDER — BETAMETHASONE SOD PHOS & ACET 6 (3-3) MG/ML IJ SUSP
12.0000 mg | Freq: Once | INTRAMUSCULAR | Status: AC
  Administered 2019-09-09: 15:00:00 12 mg

## 2019-09-09 NOTE — Progress Notes (Signed)
Numeric Pain Rating Scale and Functional Assessment Average Pain 7   In the last MONTH (on 0-10 scale) has pain interfered with the following?  1. General activity like being  able to carry out your everyday physical activities such as walking, climbing stairs, carrying groceries, or moving a chair? Yes at times  Rating 10 during activity   +Driver, -BT, -Dye Allergies.  Only has 1 kidney.

## 2019-09-09 NOTE — Procedures (Signed)
Lumbosacral Transforaminal Epidural Steroid Injection - Sub-Pedicular Approach with Fluoroscopic Guidance  Patient: Joshua Strong      Date of Birth: 17-Aug-1931 MRN: [id number] PCP: [name], MD      Visit Date: 09/09/2019   Universal Protocol:    Date/Time: 09/09/2019  Consent Given By: the patient  Position: PRONE  Additional Comments: Vital signs were monitored before and after the procedure. Patient was prepped and draped in the usual sterile fashion. The correct patient, procedure, and site was verified.   Injection Procedure Details:  Procedure Site One Meds Administered:  Meds ordered this encounter  Medications  . betamethasone acetate-betamethasone sodium phosphate (CELESTONE) injection 12 mg    Laterality: Bilateral  Location/Site:  L4-L5  Needle size: 22 G  Needle type: Spinal  Needle Placement: Transforaminal  Findings:    -Comments: Excellent flow of contrast along the nerve and into the epidural space.  Procedure Details: After squaring off the end-plates to get a true AP view, the C-arm was positioned so that an oblique view of the foramen as noted above was visualized. The target area is just inferior to the "nose of the scotty dog" or sub pedicular. The soft tissues overlying this structure were infiltrated with 2-3 ml. of 1% Lidocaine without Epinephrine.  The spinal needle was inserted toward the target using a "trajectory" view along the fluoroscope beam.  Under AP and lateral visualization, the needle was advanced so it did not puncture dura and was located close the 6 O'Clock position of the pedical in AP tracterory. Biplanar projections were used to confirm position. Aspiration was confirmed to be negative for CSF and/or blood. A 1-2 ml. volume of Isovue-250 was injected and flow of contrast was noted at each level. Radiographs were obtained for documentation purposes.   After attaining the desired flow of contrast documented above, a 0.5 to  1.0 ml test dose of 0.25% Marcaine was injected into each respective transforaminal space.  The patient was observed for 90 seconds post injection.  After no sensory deficits were reported, and normal lower extremity motor function was noted,   the above injectate was administered so that equal amounts of the injectate were placed at each foramen (level) into the transforaminal epidural space.   Additional Comments:  The patient tolerated the procedure well Dressing: 2 x 2 sterile gauze and Band-Aid    Post-procedure details: Patient was observed during the procedure. Post-procedure instructions were reviewed.  Patient left the clinic in stable condition.

## 2019-09-10 ENCOUNTER — Encounter: Payer: Self-pay | Admitting: Physical Medicine and Rehabilitation

## 2019-09-10 DIAGNOSIS — M48062 Spinal stenosis, lumbar region with neurogenic claudication: Secondary | ICD-10-CM | POA: Insufficient documentation

## 2019-09-10 DIAGNOSIS — M5416 Radiculopathy, lumbar region: Secondary | ICD-10-CM | POA: Insufficient documentation

## 2019-09-10 NOTE — Progress Notes (Signed)
Joshua Strong - 85 y.o. male MRN [id number]  Date of birth: Jan 20, 1931  Office Visit Note: Visit Date: 09/09/2019 PCP: [name], MD Referred by: Sasser, Silvestre Moment, MD  Subjective: Chief Complaint  Patient presents with   Lower Back - Pain   Left Hip - Pain   Right Hip - Pain   Left Leg - Weakness   Right Leg - Weakness   Right Knee - Pain   HPI: Joshua Strong is a 85 y.o. male who comes in today For reevaluation and management mainly of low back pain and bilateral hip and leg pain but also with multiple complaints of knee pain leg weakness and balance difficulties.  Last time I saw the patient was in July 2019 we completed epidural injection which gave him quite a bit of relief of his back and hip pain.  He has a history with [place] with intermittent epidural injection bilateral L4 transforaminal approach which does quite well on usual circumstances.  He has had a history of lumbar laminectomy decompression at L5-S1.  He has had last MRI of the lumbar spine in 2015.  Since I have last seen him he is not reported any red flag complaints of focal weakness or foot drop or bowel or bladder changes or fevers chills or night sweats.  He has had some weight loss in general.  He also has had new onset of more balance difficulties.  He is being followed by Dr. 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  I think the evaluation and management for that is ongoing.  He tells me that he is having low back pain worse with standing and ambulating refers into the hips.  He has been doing quite well he set up until the last 3 months or so it has progressively gotten worse.  He said the last shot did extremely well really up until that point.  He has had no falls or trauma.  He does state that he is having a new symptom from the knees down that his legs feel weak.  He does not endorse paresthesias.  He does not endorse focal weakness he does feel weaker.  I had  actually sent him to physical therapy after I saw him in July for several rounds of strengthening of his lower back and legs.  At that time he was having knee pain complaints that he says are chronic and ongoing.  He has had prior right total knee replacement with Dr. Jean Rosenthal.  He feels like the knee pain is always continued even after the replacement.  Where he points though is on the lateral aspect of the right knee and is tender to the touch there is no swelling.  No clicking locking or any other issue with the knee itself.  When I saw him back in July of that year I thought he was having some pes anserine bursitis and have them look at that at physical therapy as well.  He is asking about second opinion for his knee and he has seen Dr. Joni Fears in the past.  I have suggested that he get a follow-up appointment with Dr. Durward Fortes in [place] closer to his home.  Review of Systems  Constitutional: Negative for chills, fever, malaise/fatigue and weight loss.  HENT: Negative for hearing loss and sinus pain.   Eyes: Negative for blurred vision, double vision and photophobia.  Respiratory: Negative for cough  and shortness of breath.   Cardiovascular: Negative for chest pain, palpitations and leg swelling.  Gastrointestinal: Negative for abdominal pain, nausea and vomiting.  Genitourinary: Negative for flank pain.  Musculoskeletal: Positive for back pain and joint pain. Negative for myalgias.  Skin: Negative for itching and rash.  Neurological: Positive for weakness. Negative for tremors and focal weakness.  Endo/Heme/Allergies: Negative.   Psychiatric/Behavioral: Negative for depression.  All other systems reviewed and are negative.  Otherwise per HPI.  Assessment & Plan: Visit Diagnoses:  1. Spinal stenosis of lumbar region with neurogenic claudication   2. Lumbar radiculopathy   3. Chronic knee pain after total replacement of right knee joint   4. Balance disorder       Plan: Findings:  Chronic worsening low back pain bilateral hip and thigh pain worse with standing ambulating and walking related to facet arthropathy and stenosis.  MRI from 2015 showing moderate narrowing.  There is a chance that this has worsened over time since the last MRI.  He did extremely well with the last injection however and I think repeating that today is worthwhile given his symptoms and pain level.  If he did not get much relief or it did not last very long I would be very quick to update MRI of the lumbar spine and he agrees with this.  This could be a source of what he is feeling is weakness in the lower extremities.  He has not had vascular studies completed and that could be an issue as well.  He can also talk to Dr. Leta Baptist when he sees him in follow-up to see if there is any relationship to the ongoing issues with his balance difficulties.  I do not think he has been diagnosed with a neuropathy.  His knee pain has been ongoing for many years the right knee has been replaced he does get left knee pain as well.  He has had no worrisome signs today clinically and on exam.  It is tender to the touch and I recommended Voltaren gel over-the-counter 3 times a day to that area.  He is also going to call Dr. Joni Fears for his opinion on his knees.  One avenue of approach that we did not talk about would be radiofrequency ablation of the genicular nerves on the right.    Meds & Orders:  Meds ordered this encounter  Medications   betamethasone acetate-betamethasone sodium phosphate (CELESTONE) injection 12 mg    Orders Placed This Encounter  Procedures   XR C-ARM NO REPORT   Epidural Steroid injection    Follow-up: Return if symptoms worsen or fail to improve.   Procedures: No procedures performed  Lumbosacral Transforaminal Epidural Steroid Injection - Sub-Pedicular Approach with Fluoroscopic Guidance  Patient: Joshua Strong      Date of Birth: 09/05/1931 MRN:  [id number] PCP: [name], MD      Visit Date: 09/09/2019   Universal Protocol:    Date/Time: 09/09/2019  Consent Given By: the patient  Position: PRONE  Additional Comments: Vital signs were monitored before and after the procedure. Patient was prepped and draped in the usual sterile fashion. The correct patient, procedure, and site was verified.   Injection Procedure Details:  Procedure Site One Meds Administered:  Meds ordered this encounter  Medications   betamethasone acetate-betamethasone sodium phosphate (CELESTONE) injection 12 mg    Laterality: Bilateral  Location/Site:  L4-L5  Needle size: 22 G  Needle type: Spinal  Needle Placement: Transforaminal  Findings:    -  Comments: Excellent flow of contrast along the nerve and into the epidural space.  Procedure Details: After squaring off the end-plates to get a true AP view, the C-arm was positioned so that an oblique view of the foramen as noted above was visualized. The target area is just inferior to the "nose of the scotty dog" or sub pedicular. The soft tissues overlying this structure were infiltrated with 2-3 ml. of 1% Lidocaine without Epinephrine.  The spinal needle was inserted toward the target using a "trajectory" view along the fluoroscope beam.  Under AP and lateral visualization, the needle was advanced so it did not puncture dura and was located close the 6 O'Clock position of the pedical in AP tracterory. Biplanar projections were used to confirm position. Aspiration was confirmed to be negative for CSF and/or blood. A 1-2 ml. volume of Isovue-250 was injected and flow of contrast was noted at each level. Radiographs were obtained for documentation purposes.   After attaining the desired flow of contrast documented above, a 0.5 to 1.0 ml test dose of 0.25% Marcaine was injected into each respective transforaminal space.  The patient was observed for 90 seconds post injection.  After no sensory  deficits were reported, and normal lower extremity motor function was noted,   the above injectate was administered so that equal amounts of the injectate were placed at each foramen (level) into the transforaminal epidural space.   Additional Comments:  The patient tolerated the procedure well Dressing: 2 x 2 sterile gauze and Band-Aid    Post-procedure details: Patient was observed during the procedure. Post-procedure instructions were reviewed.  Patient left the clinic in stable condition.     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 At L2-3 with mild to moderate central narrowing with left-sided lateral recess narrowing. At L4-5 there is moderate multifactorial stenosis and facet arthropathy with lateral recess narrowing. At L5-S1 he is status post laminectomy with bulky fibrosis but otherwise open canal.   He reports that he quit smoking about 63 years ago. His smoking use included cigarettes. He has a 3.00 pack-year smoking history. He has never used smokeless tobacco. No results for input(s): HGBA1C, LABURIC in the last 8760 hours.  Objective:  VS:  HT:     WT:    BMI:      BP:134/85   HR:80bpm   TEMP: ( )   RESP:  Physical Exam Vitals and nursing note reviewed.  Constitutional:      General: He is not in acute distress.    Appearance: He is well-developed.  HENT:     Head: Normocephalic and atraumatic.     Nose: Nose normal.     Mouth/Throat:     Mouth: Mucous membranes are moist.     Pharynx: Oropharynx is clear.  Eyes:     Conjunctiva/sclera: Conjunctivae normal.     Pupils: Pupils are equal, round, and reactive to light.  Neck:     Trachea: No tracheal deviation.  Cardiovascular:     Rate and Rhythm: Normal rate and regular rhythm.     Pulses: Normal pulses.  Pulmonary:     Effort: Pulmonary effort is normal.     Breath sounds: Normal breath sounds.  Abdominal:     General: There  is no distension.     Palpations: Abdomen is soft.     Tenderness: There is no guarding or rebound.  Musculoskeletal:  General: No deformity.     Cervical back: Normal range of motion and neck supple.     Right lower leg: No edema.     Left lower leg: No edema.     Comments: Patient somewhat slow to rise from a seated position to full extension does have pain with facet loading and extension of the lower back.  No focal trigger points noted over he does have some taut bands in the quadratus lumborum.  No pain with hip rotation.  Good distal strength on exam.  No clonus.  Right knee pain with palpation along the insertion of the iliotibial band or maybe a little bit further a little bit along the joint line but with no effusion noted.  Good varus and valgus stability on both sides.  Skin:    General: Skin is warm and dry.     Findings: No erythema or rash.  Neurological:     General: No focal deficit present.     Mental Status: He is alert and oriented to person, place, and time.     Sensory: No sensory deficit.     Motor: No weakness or abnormal muscle tone.     Coordination: Coordination normal.     Gait: Gait abnormal.     Comments: Brief examination of his balance shows that he does want to have some retrograde balance difficulty backwards when his eyes are shot and hands are out.  He has no pronator drift.  Equivocal Romberg.  Psychiatric:        Mood and Affect: Mood normal.        Behavior: Behavior normal.        Thought Content: Thought content normal.     Ortho Exam Imaging: XR C-ARM NO REPORT  Result Date: 09/09/2019 Please see Notes tab for imaging impression.   Past Medical/Family/Surgical/Social History: Medications & Allergies reviewed per EMR, new medications updated. Patient Active Problem List   Diagnosis Date Noted   Spinal stenosis of lumbar region with neurogenic claudication 09/10/2019   Lumbar radiculopathy 09/10/2019   Presence of right  artificial knee joint 05/20/2017   Unilateral primary osteoarthritis, left knee 05/20/2017   History of total right knee replacement 02/17/2017   Chronic pain of left knee 02/17/2017   Osteoarthritis of right knee 05/24/2016   Status post total right knee replacement 05/24/2016   Diverticulosis of colon without hemorrhage    CONSTIPATION 04/19/2010   ABDOMINAL PAIN, LEFT LOWER QUADRANT 09/18/2009   EPIGASTRIC PAIN 09/18/2009   CARCINOMA, RENAL CELL 08/08/2008   PNEUMONIA 08/08/2008   ACUTE PANCREATITIS 08/08/2008   NAUSEA 08/08/2008   ABDOMINAL PAIN, CHRONIC 08/08/2008   Past Medical History:  Diagnosis Date   Arthritis    Dizziness    GERD (gastroesophageal reflux disease)    GI bleed    ulcer   Hemorrhoid    Memory loss    Pancreatitis    Pneumonia    Renal cell carcinoma ([place])    Syncope    Tremor    Tubular adenoma 2013   Family History  Adopted: Yes  Problem Relation Age of Onset   Cancer Mother    Past Surgical History:  Procedure Laterality Date   [place]   COLONOSCOPY  06/16/2007   Dr. Gala Romney- minimal internal hemorrhoids o/w normal rectum,tubular adenoma   COLONOSCOPY  07/02/2012   Dr.Rourk- normal rectum, 3 5-7 mm peduncuated polyos,  one in the mid sigmoid segment and 2 in the ascending segment. remainder of the colonic mucosa appeared normal. bx= tubular adenoma   COLONOSCOPY N/A 07/17/2015   Procedure: COLONOSCOPY;  Surgeon: Daneil Dolin, MD;  Location: [facility];  Service: Endoscopy;  Laterality: N/A;  730    EYE SURGERY     cataract with lens implants   HAND SURGERY Right 1989, 1991   KNEE SURGERY Right 2000   NEPHRECTOMY Right 2000   pancreatic duct stent     mult times   SHOULDER SURGERY Left 09/16/2011   SPINAL FUSION     testicular tumor  1989   TOTAL KNEE ARTHROPLASTY Right 05/24/2016   Procedure: RIGHT TOTAL KNEE  ARTHROPLASTY;  Surgeon: Mcarthur Rossetti, MD;  Location: [facility];  Service: Orthopedics;  Laterality: Right;   Social History   Occupational History    Comment: retired  Tobacco Use   Smoking status: Former Smoker    Packs/day: 1.00    Years: 3.00    Pack years: 3.00    Types: Cigarettes    Quit date: 05/17/1956    Years since quitting: 63.3   Smokeless tobacco: Never Used   Tobacco comment: quit in 1958  Substance and Sexual Activity   Alcohol use: No    Comment: 08/11/19 "too much for 6 yrs", quit in 1958   Drug use: No   Sexual activity: Not on file

## 2019-09-20 DIAGNOSIS — R5383 Other fatigue: Secondary | ICD-10-CM | POA: Diagnosis not present

## 2019-09-20 DIAGNOSIS — N183 Chronic kidney disease, stage 3 unspecified: Secondary | ICD-10-CM | POA: Diagnosis not present

## 2019-09-20 DIAGNOSIS — N138 Other obstructive and reflux uropathy: Secondary | ICD-10-CM | POA: Diagnosis not present

## 2019-09-20 DIAGNOSIS — K219 Gastro-esophageal reflux disease without esophagitis: Secondary | ICD-10-CM | POA: Diagnosis not present

## 2019-09-20 DIAGNOSIS — N529 Male erectile dysfunction, unspecified: Secondary | ICD-10-CM | POA: Diagnosis not present

## 2019-09-20 DIAGNOSIS — Z6826 Body mass index (BMI) 26.0-26.9, adult: Secondary | ICD-10-CM | POA: Diagnosis not present

## 2019-09-20 DIAGNOSIS — N401 Enlarged prostate with lower urinary tract symptoms: Secondary | ICD-10-CM | POA: Diagnosis not present

## 2019-10-23 ENCOUNTER — Ambulatory Visit: Payer: Medicare Other | Attending: Internal Medicine

## 2019-10-23 DIAGNOSIS — Z23 Encounter for immunization: Secondary | ICD-10-CM | POA: Insufficient documentation

## 2019-10-23 NOTE — Progress Notes (Signed)
   Covid-19 Vaccination Clinic  Name:  Joshua Strong    MRN: [id number] DOB: Feb 19, 1931  10/23/2019  Mr. Enoch was observed post Covid-19 immunization for 15 minutes without incidence. He was provided with Vaccine Information Sheet and instruction to access the V-Safe system.   Mr. Burkart was instructed to call 911 with any severe reactions post vaccine: Marland Kitchen Difficulty breathing  . Swelling of your face and throat  . A fast heartbeat  . A bad rash all over your body  . Dizziness and weakness    Immunizations Administered    Name Date Dose VIS Date Route   Pfizer COVID-19 Vaccine 10/23/2019 11:21 AM 0.3 mL 09/10/2019 Intramuscular   Manufacturer: [place]   Lot: [id number]   [place]: [id number]

## 2019-10-25 DIAGNOSIS — H40013 Open angle with borderline findings, low risk, bilateral: Secondary | ICD-10-CM | POA: Diagnosis not present

## 2019-10-25 DIAGNOSIS — H35372 Puckering of macula, left eye: Secondary | ICD-10-CM | POA: Diagnosis not present

## 2019-10-25 DIAGNOSIS — H43811 Vitreous degeneration, right eye: Secondary | ICD-10-CM | POA: Diagnosis not present

## 2019-10-25 DIAGNOSIS — H16223 Keratoconjunctivitis sicca, not specified as Sjogren's, bilateral: Secondary | ICD-10-CM | POA: Diagnosis not present

## 2019-10-25 DIAGNOSIS — H59812 Chorioretinal scars after surgery for detachment, left eye: Secondary | ICD-10-CM | POA: Diagnosis not present

## 2019-10-25 DIAGNOSIS — H04123 Dry eye syndrome of bilateral lacrimal glands: Secondary | ICD-10-CM | POA: Diagnosis not present

## 2019-10-25 DIAGNOSIS — H40043 Steroid responder, bilateral: Secondary | ICD-10-CM | POA: Diagnosis not present

## 2019-10-25 DIAGNOSIS — H5201 Hypermetropia, right eye: Secondary | ICD-10-CM | POA: Diagnosis not present

## 2019-10-25 DIAGNOSIS — H524 Presbyopia: Secondary | ICD-10-CM | POA: Diagnosis not present

## 2019-10-25 DIAGNOSIS — H40053 Ocular hypertension, bilateral: Secondary | ICD-10-CM | POA: Diagnosis not present

## 2019-10-25 DIAGNOSIS — H52223 Regular astigmatism, bilateral: Secondary | ICD-10-CM | POA: Diagnosis not present

## 2019-11-13 ENCOUNTER — Ambulatory Visit: Payer: Medicare Other | Attending: Internal Medicine

## 2019-11-13 DIAGNOSIS — Z23 Encounter for immunization: Secondary | ICD-10-CM

## 2019-11-13 NOTE — Progress Notes (Signed)
   Covid-19 Vaccination Clinic  Name:  Joshua Strong    MRN: [id number] DOB: 02/28/1931  11/13/2019  Joshua Strong was observed post Covid-19 immunization for 15 minutes without incidence. He was provided with Vaccine Information Sheet and instruction to access the V-Safe system.   Joshua Strong was instructed to call 911 with any severe reactions post vaccine: Marland Kitchen Difficulty breathing  . Swelling of your face and throat  . A fast heartbeat  . A bad rash all over your body  . Dizziness and weakness    Immunizations Administered    Name Date Dose VIS Date Route   Pfizer COVID-19 Vaccine 11/13/2019  9:21 AM 0.3 mL 09/10/2019 Intramuscular   Manufacturer: [place]   Lot: [id number]   [place]: [id number]

## 2019-11-17 DIAGNOSIS — N183 Chronic kidney disease, stage 3 unspecified: Secondary | ICD-10-CM | POA: Diagnosis not present

## 2019-11-17 DIAGNOSIS — M353 Polymyalgia rheumatica: Secondary | ICD-10-CM | POA: Diagnosis not present

## 2019-11-17 DIAGNOSIS — E782 Mixed hyperlipidemia: Secondary | ICD-10-CM | POA: Diagnosis not present

## 2019-11-23 DIAGNOSIS — H8190 Unspecified disorder of vestibular function, unspecified ear: Secondary | ICD-10-CM | POA: Diagnosis not present

## 2019-11-23 DIAGNOSIS — E782 Mixed hyperlipidemia: Secondary | ICD-10-CM | POA: Diagnosis not present

## 2019-11-23 DIAGNOSIS — R5383 Other fatigue: Secondary | ICD-10-CM | POA: Diagnosis not present

## 2019-11-23 DIAGNOSIS — R7301 Impaired fasting glucose: Secondary | ICD-10-CM | POA: Diagnosis not present

## 2019-11-23 DIAGNOSIS — R42 Dizziness and giddiness: Secondary | ICD-10-CM | POA: Diagnosis not present

## 2019-11-23 DIAGNOSIS — Z6826 Body mass index (BMI) 26.0-26.9, adult: Secondary | ICD-10-CM | POA: Diagnosis not present

## 2019-11-29 DIAGNOSIS — L720 Epidermal cyst: Secondary | ICD-10-CM | POA: Diagnosis not present

## 2019-11-29 DIAGNOSIS — L570 Actinic keratosis: Secondary | ICD-10-CM | POA: Diagnosis not present

## 2019-11-29 DIAGNOSIS — D485 Neoplasm of uncertain behavior of skin: Secondary | ICD-10-CM | POA: Diagnosis not present

## 2019-12-11 ENCOUNTER — Other Ambulatory Visit: Payer: Self-pay | Admitting: Internal Medicine

## 2019-12-20 ENCOUNTER — Other Ambulatory Visit: Payer: Self-pay

## 2019-12-22 DIAGNOSIS — Z79899 Other long term (current) drug therapy: Secondary | ICD-10-CM | POA: Diagnosis not present

## 2019-12-22 DIAGNOSIS — M810 Age-related osteoporosis without current pathological fracture: Secondary | ICD-10-CM | POA: Diagnosis not present

## 2019-12-22 DIAGNOSIS — Z5181 Encounter for therapeutic drug level monitoring: Secondary | ICD-10-CM | POA: Diagnosis not present

## 2019-12-22 MED ORDER — PANTOPRAZOLE SODIUM 20 MG PO TBEC: 20.0000 mg | DELAYED_RELEASE_TABLET | Freq: Every day | ORAL | 3 refills | Status: DC

## 2019-12-29 DIAGNOSIS — Z79899 Other long term (current) drug therapy: Secondary | ICD-10-CM | POA: Diagnosis not present

## 2019-12-29 DIAGNOSIS — Z8619 Personal history of other infectious and parasitic diseases: Secondary | ICD-10-CM | POA: Diagnosis not present

## 2019-12-29 DIAGNOSIS — K862 Cyst of pancreas: Secondary | ICD-10-CM | POA: Diagnosis not present

## 2019-12-29 DIAGNOSIS — K219 Gastro-esophageal reflux disease without esophagitis: Secondary | ICD-10-CM | POA: Diagnosis not present

## 2019-12-29 DIAGNOSIS — K861 Other chronic pancreatitis: Secondary | ICD-10-CM | POA: Diagnosis not present

## 2019-12-31 DIAGNOSIS — K861 Other chronic pancreatitis: Secondary | ICD-10-CM | POA: Diagnosis not present

## 2020-04-04 ENCOUNTER — Telehealth: Payer: Self-pay | Admitting: Radiology

## 2020-04-04 NOTE — Telephone Encounter (Signed)
Pt had Bil L4-5 TF 08/2019 and would like to have another injection. Ok to repeat? Please Advise.

## 2020-04-04 NOTE — Telephone Encounter (Signed)
Patient called triage requesting appointment for repeat lumbar injection.  States last injection was about 6 months, previously all right side, now bilateral back and into hips and legs. No numbness. Pain has increased last 2 weeks.

## 2020-04-05 NOTE — Telephone Encounter (Signed)
Called pt he is scheduled for 05/01/20 with driver.

## 2020-04-05 NOTE — Telephone Encounter (Signed)
Ok to repeat bilat L4

## 2020-04-11 ENCOUNTER — Telehealth: Payer: Self-pay | Admitting: Physical Medicine and Rehabilitation

## 2020-04-11 ENCOUNTER — Other Ambulatory Visit: Payer: Self-pay | Admitting: Physical Medicine and Rehabilitation

## 2020-04-11 MED ORDER — TRAMADOL HCL 50 MG PO TABS: 50.0000 mg | ORAL_TABLET | Freq: Three times a day (TID) | ORAL | 0 refills | Status: AC | PRN

## 2020-04-11 NOTE — Telephone Encounter (Signed)
Please advise 

## 2020-04-11 NOTE — Telephone Encounter (Signed)
Called patient to advise that prescription has been sent.

## 2020-04-11 NOTE — Telephone Encounter (Signed)
Pt would like something called in for his pain; and would like a CB when it's been sent in.   [phone]

## 2020-04-11 NOTE — Telephone Encounter (Signed)
Tramadol, done

## 2020-04-11 NOTE — Progress Notes (Signed)
Tramadol temp Rx.

## 2020-04-26 DIAGNOSIS — R1010 Upper abdominal pain, unspecified: Secondary | ICD-10-CM | POA: Diagnosis not present

## 2020-04-26 DIAGNOSIS — R1011 Right upper quadrant pain: Secondary | ICD-10-CM | POA: Diagnosis not present

## 2020-04-26 DIAGNOSIS — K219 Gastro-esophageal reflux disease without esophagitis: Secondary | ICD-10-CM | POA: Diagnosis not present

## 2020-04-26 DIAGNOSIS — Z8619 Personal history of other infectious and parasitic diseases: Secondary | ICD-10-CM | POA: Diagnosis not present

## 2020-04-26 DIAGNOSIS — K861 Other chronic pancreatitis: Secondary | ICD-10-CM | POA: Diagnosis not present

## 2020-05-01 ENCOUNTER — Ambulatory Visit (INDEPENDENT_AMBULATORY_CARE_PROVIDER_SITE_OTHER): Payer: Medicare Other | Admitting: Physical Medicine and Rehabilitation

## 2020-05-01 ENCOUNTER — Other Ambulatory Visit: Payer: Self-pay

## 2020-05-01 ENCOUNTER — Ambulatory Visit: Payer: Self-pay

## 2020-05-01 ENCOUNTER — Encounter: Payer: Self-pay | Admitting: Physical Medicine and Rehabilitation

## 2020-05-01 VITALS — BP 148/74 | HR 57

## 2020-05-01 DIAGNOSIS — M48062 Spinal stenosis, lumbar region with neurogenic claudication: Secondary | ICD-10-CM

## 2020-05-01 DIAGNOSIS — G8929 Other chronic pain: Secondary | ICD-10-CM

## 2020-05-01 DIAGNOSIS — M545 Low back pain, unspecified: Secondary | ICD-10-CM

## 2020-05-01 DIAGNOSIS — R531 Weakness: Secondary | ICD-10-CM

## 2020-05-01 DIAGNOSIS — R0789 Other chest pain: Secondary | ICD-10-CM | POA: Diagnosis not present

## 2020-05-01 DIAGNOSIS — R202 Paresthesia of skin: Secondary | ICD-10-CM

## 2020-05-01 MED ORDER — METHYLPREDNISOLONE ACETATE 80 MG/ML IJ SUSP
80.0000 mg | Freq: Once | INTRAMUSCULAR | Status: AC
  Administered 2020-05-01: 80 mg

## 2020-05-01 NOTE — Progress Notes (Signed)
Pt states lower back pain. Pt states blending over cause pain to lower back. Pt states meds helps with the pain. Pt has hx of inj on 09/09/19 it took like a week for the starte to feel good.  Numeric Pain Rating Scale and Functional Assessment Average Pain 5   In the last MONTH (on 0-10 scale) has pain interfered with the following?  1. General activity like being  able to carry out your everyday physical activities such as walking, climbing stairs, carrying groceries, or moving a chair?  Rating(10)   +Driver, -BT, -Dye Allergies.

## 2020-05-01 NOTE — Procedures (Signed)
Lumbosacral Transforaminal Epidural Steroid Injection - Sub-Pedicular Approach with Fluoroscopic Guidance  Patient: Joshua Strong      Date of Birth: Dec 27, 1930 MRN: [id number] PCP: [name], MD      Visit Date: 05/01/2020   Universal Protocol:    Date/Time: 05/01/2020  Consent Given By: the patient  Position: PRONE  Additional Comments: Vital signs were monitored before and after the procedure. Patient was prepped and draped in the usual sterile fashion. The correct patient, procedure, and site was verified.   Injection Procedure Details:  Procedure Site One Meds Administered:  Meds ordered this encounter  Medications  . methylPREDNISolone acetate (DEPO-MEDROL) injection 80 mg    Laterality: Bilateral  Location/Site:  L4-L5  Needle size: 22 G  Needle type: Spinal  Needle Placement: Transforaminal  Findings:    -Comments: Excellent flow of contrast along the nerve, nerve root and into the epidural space.  Procedure Details: After squaring off the end-plates to get a true AP view, the C-arm was positioned so that an oblique view of the foramen as noted above was visualized. The target area is just inferior to the "nose of the scotty dog" or sub pedicular. The soft tissues overlying this structure were infiltrated with 2-3 ml. of 1% Lidocaine without Epinephrine.  The spinal needle was inserted toward the target using a "trajectory" view along the fluoroscope beam.  Under AP and lateral visualization, the needle was advanced so it did not puncture dura and was located close the 6 O'Clock position of the pedical in AP tracterory. Biplanar projections were used to confirm position. Aspiration was confirmed to be negative for CSF and/or blood. A 1-2 ml. volume of Isovue-250 was injected and flow of contrast was noted at each level. Radiographs were obtained for documentation purposes.   After attaining the desired flow of contrast documented above, a 0.5 to 1.0 ml test  dose of 0.25% Marcaine was injected into each respective transforaminal space.  The patient was observed for 90 seconds post injection.  After no sensory deficits were reported, and normal lower extremity motor function was noted,   the above injectate was administered so that equal amounts of the injectate were placed at each foramen (level) into the transforaminal epidural space.   Additional Comments:  The patient tolerated the procedure well Dressing: 2 x 2 sterile gauze and Band-Aid    Post-procedure details: Patient was observed during the procedure. Post-procedure instructions were reviewed.  Patient left the clinic in stable condition.

## 2020-05-01 NOTE — Progress Notes (Signed)
Joshua Strong - 86 y.o. male MRN [id number]  Date of birth: 01-Jan-1931  Office Visit Note: Visit Date: 05/01/2020 PCP: [name], MD Referred by: Sasser, Silvestre Moment, MD  Subjective: Chief Complaint  Patient presents with  . Lower Back - Pain  . Left Leg - Weakness  . Right Leg - Weakness  . Chest Pain   HPI: Joshua Strong is a 86 y.o. male who comes in today for planned repeat Bilateral L4-L5 Lumbar epidural steroid injection with fluoroscopic guidance.  The patient has failed conservative care including home exercise, medications, time and activity modification.  This injection will be diagnostic and hopefully therapeutic.  Please see requesting physician notes for further details and justification. Patient received more than 50% pain relief from prior injection 09/09/2019.  MRI from 2015.  Depending on relief even though he has been doing quite well with these getting maybe 2 injections/year it was probably wise to update MRI at some point.  He has no focal weakness or red flag complaints.  Referring: Dr. Joni Fears   He does have 2 other specifically new complaints today 1 being costochondral chest pain has been evaluated by his GI doctors.  He has a history of problems with his pancreas and pancreatitis and has had ongoing yearly CT scans.  The CT scans have been reviewed at least from a spine standpoint off and on looking for severe central stenosis which is not shown.  He has reported some tenderness of the chest with palpation and generalized chest pain has been worked up by them.  He asked today about costochondritis and its treatment.  He has a third complaint which is not focal weakness but at least from the knees down he says his legs feel funny not really numbness or tingling or paresthesia but may be a dysesthesia.  He reports this is been ongoing for some time.  He is not diabetic.  Again no focal weakness.  He continues to workout at home with weights and staying strong for  his age.  He is a young appearing 86 year old.  Review of Systems  Cardiovascular: Positive for chest pain.  Musculoskeletal: Positive for back pain and joint pain.  Neurological: Positive for weakness.  All other systems reviewed and are negative.  Otherwise per HPI.  Assessment & Plan: Visit Diagnoses:  1. Spinal stenosis of lumbar region with neurogenic claudication   2. Chronic bilateral low back pain without sciatica   3. Paresthesia of skin   4. Weakness   5. Costochondral chest pain     Plan: Findings:  1.  Lumbar stenosis with low back pain and likely pain related to facet joint arthritis with increasing pain with standing and going from flexion to extension.  Consistent on exam with facet mediated low back pain but also referral pattern consistent with stenosis.  MRI from 2015 with moderate stenosis has been doing well with intermittent injection over time.  Depending on relief and next time we hear from them would look at lumbar spine MRI updated.  CT abdomen pelvis has been done for some time because of history of pancreatitis and at least on these no high-grade central stenosis seen.  2.  Costochondritis and costochondral pain.  This is being worked up by his physicians that he sees for his pancreatitis.  They have started him on meloxicam for just a couple week course which I think is good.  We talked about topical treatment but at this point we will see  how they do with their plans and I did discuss at length with him probably for 10 minutes or more on just costochondritis and what it is and its treatment.  3.  Dysesthesias from the knees down the legs could be a polyneuropathy.  We discussed polyneuropathy at length with him.  We discussed that he does not have diabetes but there are other reasons to have this.  Depending on relief with injection today for the lumbar spine mask and monitor has legs feel.  If they get better than it may be his lumbar spine MRI is probably  appropriate.  If it does not seem to get better with his legs would look at either electrodiagnostic study.  Counseled him on taking vitamin B complex and B12.  He already takes a multivitamin.  He will continue his workout he does like to strength training for his overall health.    Meds & Orders:  Meds ordered this encounter  Medications  . methylPREDNISolone acetate (DEPO-MEDROL) injection 80 mg    Orders Placed This Encounter  Procedures  . XR C-ARM NO REPORT  . Epidural Steroid injection    Follow-up: Return if symptoms worsen or fail to improve, for Consider MRI lumbar spine.   Procedures: No procedures performed  Lumbosacral Transforaminal Epidural Steroid Injection - Sub-Pedicular Approach with Fluoroscopic Guidance  Patient: Joshua Strong      Date of Birth: 1931/04/30 MRN: [id number] PCP: [name], MD      Visit Date: 05/01/2020   Universal Protocol:    Date/Time: 05/01/2020  Consent Given By: the patient  Position: PRONE  Additional Comments: Vital signs were monitored before and after the procedure. Patient was prepped and draped in the usual sterile fashion. The correct patient, procedure, and site was verified.   Injection Procedure Details:  Procedure Site One Meds Administered:  Meds ordered this encounter  Medications  . methylPREDNISolone acetate (DEPO-MEDROL) injection 80 mg    Laterality: Bilateral  Location/Site:  L4-L5  Needle size: 22 G  Needle type: Spinal  Needle Placement: Transforaminal  Findings:    -Comments: Excellent flow of contrast along the nerve, nerve root and into the epidural space.  Procedure Details: After squaring off the end-plates to get a true AP view, the C-arm was positioned so that an oblique view of the foramen as noted above was visualized. The target area is just inferior to the "nose of the scotty dog" or sub pedicular. The soft tissues overlying this structure were infiltrated with 2-3 ml. of 1%  Lidocaine without Epinephrine.  The spinal needle was inserted toward the target using a "trajectory" view along the fluoroscope beam.  Under AP and lateral visualization, the needle was advanced so it did not puncture dura and was located close the 6 O'Clock position of the pedical in AP tracterory. Biplanar projections were used to confirm position. Aspiration was confirmed to be negative for CSF and/or blood. A 1-2 ml. volume of Isovue-250 was injected and flow of contrast was noted at each level. Radiographs were obtained for documentation purposes.   After attaining the desired flow of contrast documented above, a 0.5 to 1.0 ml test dose of 0.25% Marcaine was injected into each respective transforaminal space.  The patient was observed for 90 seconds post injection.  After no sensory deficits were reported, and normal lower extremity motor function was noted,   the above injectate was administered so that equal amounts of the injectate were placed at each foramen (level) into the  transforaminal epidural space.   Additional Comments:  The patient tolerated the procedure well Dressing: 2 x 2 sterile gauze and Band-Aid    Post-procedure details: Patient was observed during the procedure. Post-procedure instructions were reviewed.  Patient left the clinic in stable condition.     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 At L2-3 with mild to moderate central narrowing with left-sided lateral recess narrowing. At L4-5 there is moderate multifactorial stenosis and facet arthropathy with lateral recess narrowing. At L5-S1 he is status post laminectomy with bulky fibrosis but otherwise open canal.   He reports that he quit smoking about 64 years ago. His smoking use included cigarettes. He has a 3.00 pack-year smoking history. He has never used smokeless tobacco. No results for input(s): HGBA1C, LABURIC in the  last 8760 hours.  Objective:  VS:  HT:    WT:   BMI:     BP:(!) 148/74  HR:57bpm  TEMP: ( )  RESP:  Physical Exam Vitals and nursing note reviewed.  Constitutional:      General: He is not in acute distress.    Appearance: He is well-developed.  HENT:     Head: Normocephalic and atraumatic.     Nose: Nose normal.     Mouth/Throat:     Mouth: Mucous membranes are moist.     Pharynx: Oropharynx is clear.  Eyes:     Conjunctiva/sclera: Conjunctivae normal.     Pupils: Pupils are equal, round, and reactive to light.  Neck:     Trachea: No tracheal deviation.  Cardiovascular:     Rate and Rhythm: Normal rate and regular rhythm.     Pulses: Normal pulses.     Comments: Some tenderness to palpation over the lower costochondral joints but some myofascial pain in general. Pulmonary:     Effort: Pulmonary effort is normal.     Breath sounds: Normal breath sounds.  Abdominal:     General: There is no distension.     Palpations: Abdomen is soft.     Tenderness: There is no guarding or rebound.  Musculoskeletal:        General: No deformity.     Cervical back: Normal range of motion and neck supple.     Right lower leg: No edema.     Left lower leg: No edema.     Comments: Patient ambulates with a forward flexed lumbar spine he does have concordant low back pain with extension and facet loading.  Is very stiff the lumbar spine.  No pain with hip rotation good distal strength without clonus bilaterally.  He has intact sensation in the lower extremities.  No atrophy.  Skin:    General: Skin is warm and dry.     Findings: No erythema or rash.  Neurological:     General: No focal deficit present.     Mental Status: He is alert and oriented to person, place, and time.     Motor: No abnormal muscle tone.     Coordination: Coordination normal.     Gait: Gait normal.  Psychiatric:        Mood and Affect: Mood normal.        Behavior: Behavior normal.        Thought Content: Thought  content normal.     Ortho Exam  Imaging: XR C-ARM NO REPORT  Result Date: 05/01/2020 Please see Notes tab for imaging impression.   Past Medical/Family/Surgical/Social  History: Medications & Allergies reviewed per EMR, new medications updated. Patient Active Problem List   Diagnosis Date Noted  . Spinal stenosis of lumbar region with neurogenic claudication 09/10/2019  . Lumbar radiculopathy 09/10/2019  . Presence of right artificial knee joint 05/20/2017  . Unilateral primary osteoarthritis, left knee 05/20/2017  . History of total right knee replacement 02/17/2017  . Chronic pain of left knee 02/17/2017  . Osteoarthritis of right knee 05/24/2016  . Status post total right knee replacement 05/24/2016  . Diverticulosis of colon without hemorrhage   . CONSTIPATION 04/19/2010  . ABDOMINAL PAIN, LEFT LOWER QUADRANT 09/18/2009  . EPIGASTRIC PAIN 09/18/2009  . CARCINOMA, RENAL CELL 08/08/2008  . PNEUMONIA 08/08/2008  . ACUTE PANCREATITIS 08/08/2008  . NAUSEA 08/08/2008  . ABDOMINAL PAIN, CHRONIC 08/08/2008   Past Medical History:  Diagnosis Date  . Arthritis   . Dizziness   . GERD (gastroesophageal reflux disease)   . GI bleed    ulcer  . Hemorrhoid   . Memory loss   . Pancreatitis   . Pneumonia   . Renal cell carcinoma ([place])   . Syncope   . Tremor   . Tubular adenoma 2013   Family History  Adopted: Yes  Problem Relation Age of Onset  . Cancer Mother    Past Surgical History:  Procedure Laterality Date  . APPENDECTOMY  1975  . [place]  . CHOLECYSTECTOMY  1975  . COLONOSCOPY  06/16/2007   Dr. Gala Romney- minimal internal hemorrhoids o/w normal rectum,tubular adenoma  . COLONOSCOPY  07/02/2012   Dr.Rourk- normal rectum, 3 5-7 mm peduncuated polyos, one in the mid sigmoid segment and 2 in the ascending segment. remainder of the colonic mucosa appeared normal. bx= tubular adenoma  . COLONOSCOPY N/A 07/17/2015   Procedure: COLONOSCOPY;   Surgeon: Daneil Dolin, MD;  Location: [facility];  Service: Endoscopy;  Laterality: N/A;  730   . EYE SURGERY     cataract with lens implants  . HAND SURGERY Right 1989, 1991  . KNEE SURGERY Right 2000  . NEPHRECTOMY Right 2000  . pancreatic duct stent     mult times  . SHOULDER SURGERY Left 09/16/2011  . SPINAL FUSION    . testicular tumor  1989  . TOTAL KNEE ARTHROPLASTY Right 05/24/2016   Procedure: RIGHT TOTAL KNEE ARTHROPLASTY;  Surgeon: Mcarthur Rossetti, MD;  Location: [facility];  Service: Orthopedics;  Laterality: Right;   Social History   Occupational History    Comment: retired  Tobacco Use  . Smoking status: Former Smoker    Packs/day: 1.00    Years: 3.00    Pack years: 3.00    Types: Cigarettes    Quit date: 05/17/1956    Years since quitting: 64.0  . Smokeless tobacco: Never Used  . Tobacco comment: quit in 1958  Vaping Use  . Vaping Use: Never used  Substance and Sexual Activity  . Alcohol use: No    Comment: 08/11/19 "too much for 6 yrs", quit in 1958  . Drug use: No  . Sexual activity: Not on file

## 2020-05-12 DIAGNOSIS — E7801 Familial hypercholesterolemia: Secondary | ICD-10-CM | POA: Diagnosis not present

## 2020-05-12 DIAGNOSIS — K219 Gastro-esophageal reflux disease without esophagitis: Secondary | ICD-10-CM | POA: Diagnosis not present

## 2020-05-12 DIAGNOSIS — N183 Chronic kidney disease, stage 3 unspecified: Secondary | ICD-10-CM | POA: Diagnosis not present

## 2020-05-12 DIAGNOSIS — R7301 Impaired fasting glucose: Secondary | ICD-10-CM | POA: Diagnosis not present

## 2020-05-12 DIAGNOSIS — E782 Mixed hyperlipidemia: Secondary | ICD-10-CM | POA: Diagnosis not present

## 2020-05-16 DIAGNOSIS — N401 Enlarged prostate with lower urinary tract symptoms: Secondary | ICD-10-CM | POA: Diagnosis not present

## 2020-05-16 DIAGNOSIS — K859 Acute pancreatitis without necrosis or infection, unspecified: Secondary | ICD-10-CM | POA: Diagnosis not present

## 2020-05-16 DIAGNOSIS — E782 Mixed hyperlipidemia: Secondary | ICD-10-CM | POA: Diagnosis not present

## 2020-05-16 DIAGNOSIS — Z1331 Encounter for screening for depression: Secondary | ICD-10-CM | POA: Diagnosis not present

## 2020-05-16 DIAGNOSIS — Z1389 Encounter for screening for other disorder: Secondary | ICD-10-CM | POA: Diagnosis not present

## 2020-05-16 DIAGNOSIS — H8190 Unspecified disorder of vestibular function, unspecified ear: Secondary | ICD-10-CM | POA: Diagnosis not present

## 2020-05-16 DIAGNOSIS — R7301 Impaired fasting glucose: Secondary | ICD-10-CM | POA: Diagnosis not present

## 2020-05-19 DIAGNOSIS — K861 Other chronic pancreatitis: Secondary | ICD-10-CM | POA: Diagnosis not present

## 2020-06-19 DIAGNOSIS — L570 Actinic keratosis: Secondary | ICD-10-CM | POA: Diagnosis not present

## 2020-06-19 DIAGNOSIS — Z85828 Personal history of other malignant neoplasm of skin: Secondary | ICD-10-CM | POA: Diagnosis not present

## 2020-06-19 DIAGNOSIS — L723 Sebaceous cyst: Secondary | ICD-10-CM | POA: Diagnosis not present

## 2020-06-26 DIAGNOSIS — Z23 Encounter for immunization: Secondary | ICD-10-CM | POA: Diagnosis not present

## 2020-06-27 DIAGNOSIS — Z9181 History of falling: Secondary | ICD-10-CM | POA: Diagnosis not present

## 2020-06-27 DIAGNOSIS — Z79899 Other long term (current) drug therapy: Secondary | ICD-10-CM | POA: Diagnosis not present

## 2020-06-27 DIAGNOSIS — M353 Polymyalgia rheumatica: Secondary | ICD-10-CM | POA: Diagnosis not present

## 2020-06-27 DIAGNOSIS — Z87891 Personal history of nicotine dependence: Secondary | ICD-10-CM | POA: Diagnosis not present

## 2020-06-27 DIAGNOSIS — M810 Age-related osteoporosis without current pathological fracture: Secondary | ICD-10-CM | POA: Diagnosis not present

## 2020-06-27 DIAGNOSIS — N289 Disorder of kidney and ureter, unspecified: Secondary | ICD-10-CM | POA: Diagnosis not present

## 2020-06-27 DIAGNOSIS — Z85528 Personal history of other malignant neoplasm of kidney: Secondary | ICD-10-CM | POA: Diagnosis not present

## 2020-06-27 DIAGNOSIS — Z5181 Encounter for therapeutic drug level monitoring: Secondary | ICD-10-CM | POA: Diagnosis not present

## 2020-06-27 DIAGNOSIS — K219 Gastro-esophageal reflux disease without esophagitis: Secondary | ICD-10-CM | POA: Diagnosis not present

## 2020-07-20 ENCOUNTER — Telehealth: Payer: Self-pay | Admitting: Physical Medicine and Rehabilitation

## 2020-07-20 ENCOUNTER — Other Ambulatory Visit: Payer: Self-pay | Admitting: Physical Medicine and Rehabilitation

## 2020-07-20 DIAGNOSIS — M48062 Spinal stenosis, lumbar region with neurogenic claudication: Secondary | ICD-10-CM

## 2020-07-20 DIAGNOSIS — M5416 Radiculopathy, lumbar region: Secondary | ICD-10-CM

## 2020-07-20 MED ORDER — TRAMADOL HCL 50 MG PO TABS: 50.0000 mg | ORAL_TABLET | Freq: Three times a day (TID) | ORAL | 0 refills | Status: AC | PRN

## 2020-07-20 NOTE — Telephone Encounter (Signed)
Ic copied this to [place]. I rx'd tramadol. He will need MRI Lspine, she can put in. Se my last note. He might benefit from medial branch/RFA depending on MR. If chronic pain meds can see PCP

## 2020-07-20 NOTE — Progress Notes (Signed)
Rx for tramadol. Needs MRI and possible Medial branch blocks

## 2020-07-20 NOTE — Telephone Encounter (Signed)
Patient called requesting pain medication. Patient states the injections did not help and would like meds to see if that works. Patient pharmacy is [facility] in [place]. Patient phone number is [phone].

## 2020-07-20 NOTE — Telephone Encounter (Signed)
Please advise 

## 2020-07-24 NOTE — Telephone Encounter (Signed)
MRI ordered and patient notified.

## 2020-07-26 ENCOUNTER — Other Ambulatory Visit: Payer: Self-pay | Admitting: Physical Medicine and Rehabilitation

## 2020-07-26 DIAGNOSIS — M48062 Spinal stenosis, lumbar region with neurogenic claudication: Secondary | ICD-10-CM

## 2020-07-26 DIAGNOSIS — M5416 Radiculopathy, lumbar region: Secondary | ICD-10-CM

## 2020-08-15 ENCOUNTER — Other Ambulatory Visit: Payer: Self-pay

## 2020-08-15 ENCOUNTER — Ambulatory Visit
Admission: RE | Admit: 2020-08-15 | Discharge: 2020-08-15 | Disposition: A | Discharge status: Home / Self Care | Payer: Medicare Other | Source: Ambulatory Visit | Attending: Physical Medicine and Rehabilitation | Admitting: Physical Medicine and Rehabilitation

## 2020-08-15 DIAGNOSIS — M48062 Spinal stenosis, lumbar region with neurogenic claudication: Secondary | ICD-10-CM

## 2020-08-15 DIAGNOSIS — M5416 Radiculopathy, lumbar region: Secondary | ICD-10-CM

## 2020-08-15 DIAGNOSIS — M545 Low back pain, unspecified: Secondary | ICD-10-CM | POA: Diagnosis not present

## 2020-08-15 DIAGNOSIS — M48061 Spinal stenosis, lumbar region without neurogenic claudication: Secondary | ICD-10-CM | POA: Diagnosis not present

## 2020-08-15 DIAGNOSIS — Z01818 Encounter for other preprocedural examination: Secondary | ICD-10-CM | POA: Diagnosis not present

## 2020-08-16 ENCOUNTER — Telehealth: Payer: Self-pay | Admitting: Physical Medicine and Rehabilitation

## 2020-08-16 NOTE — Telephone Encounter (Signed)
-----   Message from Magnus Sinning, MD sent at 08/16/2020 11:25 AM EST ----- Regarding: MRI As suspected the stenosis at L4-5 has progressed to severe from moderate narraowing, no worrisome findings otherwise. Can f/up if needed, Bilateral L4 tf esi likely best options if helping.

## 2020-08-16 NOTE — Telephone Encounter (Signed)
Left message #1 to advise patient of MRI findings and to see if his pain is improved/ if he wants to schedule a follow up.

## 2020-08-16 NOTE — Telephone Encounter (Signed)
Patient returned call asked for a call back. The number to contact patient is [phone] or [phone] Cell

## 2020-08-16 NOTE — Telephone Encounter (Signed)
See previous message

## 2020-08-16 NOTE — Telephone Encounter (Signed)
Advised patient on MRI findings. He states that his back is better right now and he will call if the pain worsens again to schedule repeat injections.

## 2020-09-05 DIAGNOSIS — R109 Unspecified abdominal pain: Secondary | ICD-10-CM | POA: Diagnosis not present

## 2020-09-05 DIAGNOSIS — R2689 Other abnormalities of gait and mobility: Secondary | ICD-10-CM | POA: Diagnosis not present

## 2020-09-05 DIAGNOSIS — N400 Enlarged prostate without lower urinary tract symptoms: Secondary | ICD-10-CM | POA: Diagnosis not present

## 2020-09-05 DIAGNOSIS — Z6826 Body mass index (BMI) 26.0-26.9, adult: Secondary | ICD-10-CM | POA: Diagnosis not present

## 2020-09-05 DIAGNOSIS — G629 Polyneuropathy, unspecified: Secondary | ICD-10-CM | POA: Diagnosis not present

## 2020-09-05 DIAGNOSIS — E559 Vitamin D deficiency, unspecified: Secondary | ICD-10-CM | POA: Diagnosis not present

## 2020-09-05 DIAGNOSIS — R42 Dizziness and giddiness: Secondary | ICD-10-CM | POA: Diagnosis not present

## 2020-09-05 DIAGNOSIS — R5383 Other fatigue: Secondary | ICD-10-CM | POA: Diagnosis not present

## 2020-09-27 ENCOUNTER — Telehealth: Payer: Self-pay | Admitting: Physical Medicine and Rehabilitation

## 2020-09-27 NOTE — Telephone Encounter (Signed)
Pt called and was wondering if you can get him in for an injection in lower back because he's in a lot of pain and was wondering if he could get something for the pain because its very difficult for him to walk.

## 2020-09-27 NOTE — Telephone Encounter (Signed)
Scheduled patient for possible bilateral L4 TF on 1/6 with driver. Advised patient that Dr. Alvester Morin is out of the office and he could try to call his PCP to see if he can recommend/ prescribe something to help with his pain until then.

## 2020-10-04 ENCOUNTER — Telehealth: Payer: Self-pay | Admitting: Physical Medicine and Rehabilitation

## 2020-10-04 ENCOUNTER — Other Ambulatory Visit: Payer: Self-pay | Admitting: Physical Medicine and Rehabilitation

## 2020-10-04 DIAGNOSIS — F411 Generalized anxiety disorder: Secondary | ICD-10-CM

## 2020-10-04 MED ORDER — DIAZEPAM 5 MG PO TABS: ORAL_TABLET | ORAL | 0 refills | Status: DC

## 2020-10-04 NOTE — Telephone Encounter (Signed)
Patient is scheduled for 1/6 and would like valium prior.

## 2020-10-04 NOTE — Telephone Encounter (Signed)
Pt called stating Dr.Newton usually calls in a rx (not sure of the name) to help with the pain of the injections, and he has an appt on 10/05/20 and the pt would like to have this rx called in please.   [phone]

## 2020-10-04 NOTE — Telephone Encounter (Signed)
This was done earlier today.

## 2020-10-04 NOTE — Progress Notes (Unsigned)
Pre-procedure diazepam ordered for pre-operative anxiety.  

## 2020-10-05 ENCOUNTER — Ambulatory Visit (INDEPENDENT_AMBULATORY_CARE_PROVIDER_SITE_OTHER): Payer: Medicare Other | Admitting: Physical Medicine and Rehabilitation

## 2020-10-05 ENCOUNTER — Ambulatory Visit: Payer: Self-pay

## 2020-10-05 ENCOUNTER — Other Ambulatory Visit: Payer: Self-pay

## 2020-10-05 ENCOUNTER — Encounter: Payer: Self-pay | Admitting: Physical Medicine and Rehabilitation

## 2020-10-05 VITALS — BP 137/74 | HR 71

## 2020-10-05 DIAGNOSIS — G119 Hereditary ataxia, unspecified: Secondary | ICD-10-CM

## 2020-10-05 DIAGNOSIS — M48062 Spinal stenosis, lumbar region with neurogenic claudication: Secondary | ICD-10-CM

## 2020-10-05 DIAGNOSIS — M5416 Radiculopathy, lumbar region: Secondary | ICD-10-CM

## 2020-10-05 DIAGNOSIS — R2689 Other abnormalities of gait and mobility: Secondary | ICD-10-CM

## 2020-10-05 MED ORDER — METHYLPREDNISOLONE ACETATE 80 MG/ML IJ SUSP
80.0000 mg | Freq: Once | INTRAMUSCULAR | Status: AC
  Administered 2020-10-05: 80 mg

## 2020-10-05 NOTE — Progress Notes (Signed)
Pt state lower back pain that travels down to his leg knee and his right leg. Pt state getting out of bed make the pain worse. Pt state he take pain meds to help ease the pain Pt has hx of inj on 05/01/20 pt state it took two weeks before the pain ease up.  Numeric Pain Rating Scale and Functional Assessment Average Pain 5   In the last MONTH (on 0-10 scale) has pain interfered with the following?  1. General activity like being  able to carry out your everyday physical activities such as walking, climbing stairs, carrying groceries, or moving a chair?  Rating(10)   +Driver, -BT, -Dye Allergies.

## 2020-10-26 DIAGNOSIS — H40053 Ocular hypertension, bilateral: Secondary | ICD-10-CM | POA: Diagnosis not present

## 2020-10-27 DIAGNOSIS — M19032 Primary osteoarthritis, left wrist: Secondary | ICD-10-CM | POA: Diagnosis not present

## 2020-10-27 DIAGNOSIS — M654 Radial styloid tenosynovitis [de Quervain]: Secondary | ICD-10-CM | POA: Diagnosis not present

## 2020-10-27 DIAGNOSIS — M19039 Primary osteoarthritis, unspecified wrist: Secondary | ICD-10-CM | POA: Diagnosis not present

## 2020-10-27 DIAGNOSIS — M778 Other enthesopathies, not elsewhere classified: Secondary | ICD-10-CM | POA: Diagnosis not present

## 2020-11-10 DIAGNOSIS — E7801 Familial hypercholesterolemia: Secondary | ICD-10-CM | POA: Diagnosis not present

## 2020-11-10 DIAGNOSIS — N183 Chronic kidney disease, stage 3 unspecified: Secondary | ICD-10-CM | POA: Diagnosis not present

## 2020-11-10 DIAGNOSIS — K219 Gastro-esophageal reflux disease without esophagitis: Secondary | ICD-10-CM | POA: Diagnosis not present

## 2020-11-10 DIAGNOSIS — E782 Mixed hyperlipidemia: Secondary | ICD-10-CM | POA: Diagnosis not present

## 2020-11-10 DIAGNOSIS — R7301 Impaired fasting glucose: Secondary | ICD-10-CM | POA: Diagnosis not present

## 2020-11-10 DIAGNOSIS — E7849 Other hyperlipidemia: Secondary | ICD-10-CM | POA: Diagnosis not present

## 2020-11-13 DIAGNOSIS — M25642 Stiffness of left hand, not elsewhere classified: Secondary | ICD-10-CM | POA: Diagnosis not present

## 2020-11-13 DIAGNOSIS — M25532 Pain in left wrist: Secondary | ICD-10-CM | POA: Diagnosis not present

## 2020-11-13 DIAGNOSIS — M79642 Pain in left hand: Secondary | ICD-10-CM | POA: Diagnosis not present

## 2020-11-13 DIAGNOSIS — M778 Other enthesopathies, not elsewhere classified: Secondary | ICD-10-CM | POA: Diagnosis not present

## 2020-11-13 DIAGNOSIS — M19032 Primary osteoarthritis, left wrist: Secondary | ICD-10-CM | POA: Diagnosis not present

## 2020-11-13 DIAGNOSIS — R531 Weakness: Secondary | ICD-10-CM | POA: Diagnosis not present

## 2020-11-13 DIAGNOSIS — M654 Radial styloid tenosynovitis [de Quervain]: Secondary | ICD-10-CM | POA: Diagnosis not present

## 2020-11-13 DIAGNOSIS — M25632 Stiffness of left wrist, not elsewhere classified: Secondary | ICD-10-CM | POA: Diagnosis not present

## 2020-11-15 ENCOUNTER — Encounter: Payer: Self-pay | Admitting: Physical Medicine and Rehabilitation

## 2020-11-15 DIAGNOSIS — R2689 Other abnormalities of gait and mobility: Secondary | ICD-10-CM | POA: Insufficient documentation

## 2020-11-15 DIAGNOSIS — R5383 Other fatigue: Secondary | ICD-10-CM | POA: Diagnosis not present

## 2020-11-15 DIAGNOSIS — L299 Pruritus, unspecified: Secondary | ICD-10-CM | POA: Diagnosis not present

## 2020-11-15 DIAGNOSIS — R4189 Other symptoms and signs involving cognitive functions and awareness: Secondary | ICD-10-CM | POA: Diagnosis not present

## 2020-11-15 DIAGNOSIS — K859 Acute pancreatitis without necrosis or infection, unspecified: Secondary | ICD-10-CM | POA: Diagnosis not present

## 2020-11-15 DIAGNOSIS — R42 Dizziness and giddiness: Secondary | ICD-10-CM | POA: Diagnosis not present

## 2020-11-15 DIAGNOSIS — G119 Hereditary ataxia, unspecified: Secondary | ICD-10-CM | POA: Insufficient documentation

## 2020-11-15 DIAGNOSIS — R109 Unspecified abdominal pain: Secondary | ICD-10-CM | POA: Diagnosis not present

## 2020-11-15 DIAGNOSIS — N401 Enlarged prostate with lower urinary tract symptoms: Secondary | ICD-10-CM | POA: Diagnosis not present

## 2020-11-15 DIAGNOSIS — E7849 Other hyperlipidemia: Secondary | ICD-10-CM | POA: Diagnosis not present

## 2020-11-15 NOTE — Procedures (Signed)
Lumbosacral Transforaminal Epidural Steroid Injection - Sub-Pedicular Approach with Fluoroscopic Guidance  Patient: Joshua Strong      Date of Birth: May 09, 1931 MRN: [id number] PCP: [name], MD      Visit Date: 10/05/2020   Universal Protocol:    Date/Time: 10/05/2020  Consent Given By: the patient  Position: PRONE  Additional Comments: Vital signs were monitored before and after the procedure. Patient was prepped and draped in the usual sterile fashion. The correct patient, procedure, and site was verified.   Injection Procedure Details:   Procedure diagnoses: Spinal stenosis of lumbar region with neurogenic claudication [M48.062]    Meds Administered:  Meds ordered this encounter  Medications  . methylPREDNISolone acetate (DEPO-MEDROL) injection 80 mg    Laterality: Bilateral  Location/Site:  L4-L5  Needle:5.0 in., 22 ga.  Short bevel or Quincke spinal needle  Needle Placement: Transforaminal  Findings:    -Comments: Excellent flow of contrast along the nerve, nerve root and into the epidural space.  Procedure Details: After squaring off the end-plates to get a true AP view, the C-arm was positioned so that an oblique view of the foramen as noted above was visualized. The target area is just inferior to the "nose of the scotty dog" or sub pedicular. The soft tissues overlying this structure were infiltrated with 2-3 ml. of 1% Lidocaine without Epinephrine.  The spinal needle was inserted toward the target using a "trajectory" view along the fluoroscope beam.  Under AP and lateral visualization, the needle was advanced so it did not puncture dura and was located close the 6 O'Clock position of the pedical in AP tracterory. Biplanar projections were used to confirm position. Aspiration was confirmed to be negative for CSF and/or blood. A 1-2 ml. volume of Isovue-250 was injected and flow of contrast was noted at each level. Radiographs were obtained for documentation  purposes.   After attaining the desired flow of contrast documented above, a 0.5 to 1.0 ml test dose of 0.25% Marcaine was injected into each respective transforaminal space.  The patient was observed for 90 seconds post injection.  After no sensory deficits were reported, and normal lower extremity motor function was noted,   the above injectate was administered so that equal amounts of the injectate were placed at each foramen (level) into the transforaminal epidural space.   Additional Comments:  The patient tolerated the procedure well Dressing: 2 x 2 sterile gauze and Band-Aid    Post-procedure details: Patient was observed during the procedure. Post-procedure instructions were reviewed.  Patient left the clinic in stable condition.

## 2020-11-15 NOTE — Progress Notes (Signed)
Joshua Strong - 87 y.o. male MRN [id number]  Date of birth: 1930-11-09  Office Visit Note: Visit Date: 10/05/2020 PCP: [name], MD Referred by: Sasser, Silvestre Moment, MD  Subjective: Chief Complaint  Patient presents with  . Lower Back - Pain  . Left Knee - Pain  . Right Leg - Pain  . Right Knee - Weakness, Pain  . Right Foot - Weakness, Pain   HPI:  Joshua Strong is a 87 y.o. male who comes in today for planned repeat Bilateral L4-L5 Lumbar epidural steroid injection with fluoroscopic guidance.  The patient has failed conservative care including home exercise, medications, time and activity modification.  This injection will be diagnostic and hopefully therapeutic.  Please see requesting physician notes for further details and justification. Patient received more than 50% pain relief from prior injection.  He is well-known to me and have seen him for quite some time for multifactorial stenosis at L4-5 with history of prior lumbar laminectomy below this level.  He does well with intermittent epidural injection from a transforaminal approach at this level.  Last injection did give him quite a bit of relief he said it took about 2 weeks total to get full effect.  At his age with medical conditions he is not interested in surgery at this point.  He continues with current medication and activity level.  Today we had a long discussion as he was asking about balance difficulties in walking and its relationship to his spine.  The patient does not have severe enough stenosis in the lumbar spine related to affect the nerves enough that would give him the type of balance problems he is having.  He has been treated for cerebellar ataxia by Dr. Leta Baptist at [facility].  Today on exam he is very unsteady on his feet he does ambulate with a cane.  He clearly has more ataxia than just balance issues in general.  This is not a strength issue.  We did talk to him about doing squats from a chair at  home for strengthening his legs and having something nearby to help with his balance as he is doing the squats I think this will help him on a daily basis get stronger.  Unfortunately talked about the fact that his cerebellar ataxia is something that is probably not amenable to most treatment.  He will follow up with Dr. Leta Baptist.    Review of Systems  Musculoskeletal: Positive for back pain and joint pain.  Neurological: Positive for weakness.       Balance  All other systems reviewed and are negative.  Otherwise per HPI.  Assessment & Plan: Visit Diagnoses:    ICD-10-CM   1. Spinal stenosis of lumbar region with neurogenic claudication  M48.062 XR C-ARM NO REPORT    Epidural Steroid injection    methylPREDNISolone acetate (DEPO-MEDROL) injection 80 mg  2. Lumbar radiculopathy  M54.16 XR C-ARM NO REPORT    Epidural Steroid injection    methylPREDNISolone acetate (DEPO-MEDROL) injection 80 mg  3. Balance disorder  R26.89   4. Cerebellar ataxia (HCC)  G11.9     Plan: Findings:  1.  Low back and bilateral hip and leg pain.  Some paresthesias.  Likely issue with the lumbar stenosis and is done well with prior injection so we will repeat that as planned today.  Repeat injections are not a problem depending on the length of time he gets out of the injections in terms of pain  relief.  Continue to stay active and strengthening as directed.  2.  Balance difficulties and gait disorder with history of cerebellar ataxia.  I do not think this is coming from his spine.  This is probably the cerebellar ataxia and I have encouraged him to follow-up with Dr. Leta Baptist.  We did give him some instruction on strength exercises for his lower segment.    Meds & Orders:  Meds ordered this encounter  Medications  . methylPREDNISolone acetate (DEPO-MEDROL) injection 80 mg    Orders Placed This Encounter  Procedures  . XR C-ARM NO REPORT  . Epidural Steroid injection    Follow-up: Return if symptoms  worsen or fail to improve.   Procedures: No procedures performed  Lumbosacral Transforaminal Epidural Steroid Injection - Sub-Pedicular Approach with Fluoroscopic Guidance  Patient: Joshua Strong      Date of Birth: 01/14/1931 MRN: [id number] PCP: [name], MD      Visit Date: 10/05/2020   Universal Protocol:    Date/Time: 10/05/2020  Consent Given By: the patient  Position: PRONE  Additional Comments: Vital signs were monitored before and after the procedure. Patient was prepped and draped in the usual sterile fashion. The correct patient, procedure, and site was verified.   Injection Procedure Details:   Procedure diagnoses: Spinal stenosis of lumbar region with neurogenic claudication [M48.062]    Meds Administered:  Meds ordered this encounter  Medications  . methylPREDNISolone acetate (DEPO-MEDROL) injection 80 mg    Laterality: Bilateral  Location/Site:  L4-L5  Needle:5.0 in., 22 ga.  Short bevel or Quincke spinal needle  Needle Placement: Transforaminal  Findings:    -Comments: Excellent flow of contrast along the nerve, nerve root and into the epidural space.  Procedure Details: After squaring off the end-plates to get a true AP view, the C-arm was positioned so that an oblique view of the foramen as noted above was visualized. The target area is just inferior to the "nose of the scotty dog" or sub pedicular. The soft tissues overlying this structure were infiltrated with 2-3 ml. of 1% Lidocaine without Epinephrine.  The spinal needle was inserted toward the target using a "trajectory" view along the fluoroscope beam.  Under AP and lateral visualization, the needle was advanced so it did not puncture dura and was located close the 6 O'Clock position of the pedical in AP tracterory. Biplanar projections were used to confirm position. Aspiration was confirmed to be negative for CSF and/or blood. A 1-2 ml. volume of Isovue-250 was injected and flow of  contrast was noted at each level. Radiographs were obtained for documentation purposes.   After attaining the desired flow of contrast documented above, a 0.5 to 1.0 ml test dose of 0.25% Marcaine was injected into each respective transforaminal space.  The patient was observed for 90 seconds post injection.  After no sensory deficits were reported, and normal lower extremity motor function was noted,   the above injectate was administered so that equal amounts of the injectate were placed at each foramen (level) into the transforaminal epidural space.   Additional Comments:  The patient tolerated the procedure well Dressing: 2 x 2 sterile gauze and Band-Aid    Post-procedure details: Patient was observed during the procedure. Post-procedure instructions were reviewed.  Patient left the clinic in stable condition.      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 At L2-3 with mild to moderate central narrowing with  left-sided lateral recess narrowing. At L4-5 there is moderate multifactorial stenosis and facet arthropathy with lateral recess narrowing. At L5-S1 he is status post laminectomy with bulky fibrosis but otherwise open canal.     Objective:  VS:  HT:    WT:   BMI:     BP:137/74  HR:71bpm  TEMP: ( )  RESP:  Physical Exam Vitals and nursing note reviewed.  Constitutional:      General: He is not in acute distress.    Appearance: Normal appearance. He is not ill-appearing.  HENT:     Head: Normocephalic and atraumatic.     Right Ear: External ear normal.     Left Ear: External ear normal.     Nose: No congestion.  Eyes:     Extraocular Movements: Extraocular movements intact.  Cardiovascular:     Rate and Rhythm: Normal rate.     Pulses: Normal pulses.  Pulmonary:     Effort: Pulmonary effort is normal. No respiratory distress.  Abdominal:     General: There is no distension.      Palpations: Abdomen is soft.  Musculoskeletal:        General: No tenderness or signs of injury.     Cervical back: Neck supple.     Right lower leg: No edema.     Left lower leg: No edema.     Comments: Patient has good distal strength without clonus.  He has difficulty standing and maintaining balance particularly with walking.  He does ambulate with a forward flexed lumbar spine.  He is very unsteady with his gait.  He does ambulate with a cane.  Skin:    Findings: No erythema or rash.  Neurological:     General: No focal deficit present.     Mental Status: He is alert and oriented to person, place, and time.     Cranial Nerves: No cranial nerve deficit.     Sensory: No sensory deficit.     Motor: No weakness or abnormal muscle tone.     Coordination: Coordination abnormal.     Gait: Gait abnormal.  Psychiatric:        Mood and Affect: Mood normal.        Behavior: Behavior normal.      Imaging: No results found.

## 2020-11-20 DIAGNOSIS — R531 Weakness: Secondary | ICD-10-CM | POA: Diagnosis not present

## 2020-11-20 DIAGNOSIS — M25632 Stiffness of left wrist, not elsewhere classified: Secondary | ICD-10-CM | POA: Diagnosis not present

## 2020-11-20 DIAGNOSIS — M25532 Pain in left wrist: Secondary | ICD-10-CM | POA: Diagnosis not present

## 2020-11-20 DIAGNOSIS — M25642 Stiffness of left hand, not elsewhere classified: Secondary | ICD-10-CM | POA: Diagnosis not present

## 2020-11-20 DIAGNOSIS — M19032 Primary osteoarthritis, left wrist: Secondary | ICD-10-CM | POA: Diagnosis not present

## 2020-11-20 DIAGNOSIS — M654 Radial styloid tenosynovitis [de Quervain]: Secondary | ICD-10-CM | POA: Diagnosis not present

## 2020-11-20 DIAGNOSIS — M79642 Pain in left hand: Secondary | ICD-10-CM | POA: Diagnosis not present

## 2020-11-20 DIAGNOSIS — M778 Other enthesopathies, not elsewhere classified: Secondary | ICD-10-CM | POA: Diagnosis not present

## 2020-11-21 DIAGNOSIS — M19032 Primary osteoarthritis, left wrist: Secondary | ICD-10-CM | POA: Diagnosis not present

## 2020-11-21 DIAGNOSIS — M25532 Pain in left wrist: Secondary | ICD-10-CM | POA: Diagnosis not present

## 2020-11-21 DIAGNOSIS — M25632 Stiffness of left wrist, not elsewhere classified: Secondary | ICD-10-CM | POA: Diagnosis not present

## 2020-11-21 DIAGNOSIS — M79642 Pain in left hand: Secondary | ICD-10-CM | POA: Diagnosis not present

## 2020-11-21 DIAGNOSIS — M654 Radial styloid tenosynovitis [de Quervain]: Secondary | ICD-10-CM | POA: Diagnosis not present

## 2020-11-21 DIAGNOSIS — M778 Other enthesopathies, not elsewhere classified: Secondary | ICD-10-CM | POA: Diagnosis not present

## 2020-11-21 DIAGNOSIS — R531 Weakness: Secondary | ICD-10-CM | POA: Diagnosis not present

## 2020-11-21 DIAGNOSIS — M25642 Stiffness of left hand, not elsewhere classified: Secondary | ICD-10-CM | POA: Diagnosis not present

## 2020-11-22 DIAGNOSIS — M25632 Stiffness of left wrist, not elsewhere classified: Secondary | ICD-10-CM | POA: Diagnosis not present

## 2020-11-22 DIAGNOSIS — M19032 Primary osteoarthritis, left wrist: Secondary | ICD-10-CM | POA: Diagnosis not present

## 2020-11-22 DIAGNOSIS — M25642 Stiffness of left hand, not elsewhere classified: Secondary | ICD-10-CM | POA: Diagnosis not present

## 2020-11-22 DIAGNOSIS — M25532 Pain in left wrist: Secondary | ICD-10-CM | POA: Diagnosis not present

## 2020-11-22 DIAGNOSIS — R531 Weakness: Secondary | ICD-10-CM | POA: Diagnosis not present

## 2020-11-22 DIAGNOSIS — M778 Other enthesopathies, not elsewhere classified: Secondary | ICD-10-CM | POA: Diagnosis not present

## 2020-11-22 DIAGNOSIS — M79642 Pain in left hand: Secondary | ICD-10-CM | POA: Diagnosis not present

## 2020-11-22 DIAGNOSIS — M654 Radial styloid tenosynovitis [de Quervain]: Secondary | ICD-10-CM | POA: Diagnosis not present

## 2020-11-27 DIAGNOSIS — M25532 Pain in left wrist: Secondary | ICD-10-CM | POA: Diagnosis not present

## 2020-11-27 DIAGNOSIS — M19032 Primary osteoarthritis, left wrist: Secondary | ICD-10-CM | POA: Diagnosis not present

## 2020-11-27 DIAGNOSIS — M778 Other enthesopathies, not elsewhere classified: Secondary | ICD-10-CM | POA: Diagnosis not present

## 2020-11-27 DIAGNOSIS — R531 Weakness: Secondary | ICD-10-CM | POA: Diagnosis not present

## 2020-11-27 DIAGNOSIS — M79642 Pain in left hand: Secondary | ICD-10-CM | POA: Diagnosis not present

## 2020-11-27 DIAGNOSIS — M654 Radial styloid tenosynovitis [de Quervain]: Secondary | ICD-10-CM | POA: Diagnosis not present

## 2020-11-27 DIAGNOSIS — M25632 Stiffness of left wrist, not elsewhere classified: Secondary | ICD-10-CM | POA: Diagnosis not present

## 2020-11-27 DIAGNOSIS — M25642 Stiffness of left hand, not elsewhere classified: Secondary | ICD-10-CM | POA: Diagnosis not present

## 2020-11-29 DIAGNOSIS — M19032 Primary osteoarthritis, left wrist: Secondary | ICD-10-CM | POA: Diagnosis not present

## 2020-11-29 DIAGNOSIS — M25632 Stiffness of left wrist, not elsewhere classified: Secondary | ICD-10-CM | POA: Diagnosis not present

## 2020-11-29 DIAGNOSIS — M25642 Stiffness of left hand, not elsewhere classified: Secondary | ICD-10-CM | POA: Diagnosis not present

## 2020-11-29 DIAGNOSIS — M79642 Pain in left hand: Secondary | ICD-10-CM | POA: Diagnosis not present

## 2020-11-29 DIAGNOSIS — M778 Other enthesopathies, not elsewhere classified: Secondary | ICD-10-CM | POA: Diagnosis not present

## 2020-11-29 DIAGNOSIS — R531 Weakness: Secondary | ICD-10-CM | POA: Diagnosis not present

## 2020-11-29 DIAGNOSIS — M25532 Pain in left wrist: Secondary | ICD-10-CM | POA: Diagnosis not present

## 2020-11-29 DIAGNOSIS — M654 Radial styloid tenosynovitis [de Quervain]: Secondary | ICD-10-CM | POA: Diagnosis not present

## 2020-11-30 DIAGNOSIS — M19039 Primary osteoarthritis, unspecified wrist: Secondary | ICD-10-CM | POA: Diagnosis not present

## 2020-11-30 DIAGNOSIS — M19032 Primary osteoarthritis, left wrist: Secondary | ICD-10-CM | POA: Diagnosis not present

## 2020-11-30 DIAGNOSIS — M778 Other enthesopathies, not elsewhere classified: Secondary | ICD-10-CM | POA: Diagnosis not present

## 2020-11-30 DIAGNOSIS — M654 Radial styloid tenosynovitis [de Quervain]: Secondary | ICD-10-CM | POA: Diagnosis not present

## 2020-12-06 DIAGNOSIS — M778 Other enthesopathies, not elsewhere classified: Secondary | ICD-10-CM | POA: Diagnosis not present

## 2020-12-06 DIAGNOSIS — M25532 Pain in left wrist: Secondary | ICD-10-CM | POA: Diagnosis not present

## 2020-12-06 DIAGNOSIS — M19032 Primary osteoarthritis, left wrist: Secondary | ICD-10-CM | POA: Diagnosis not present

## 2020-12-06 DIAGNOSIS — M25642 Stiffness of left hand, not elsewhere classified: Secondary | ICD-10-CM | POA: Diagnosis not present

## 2020-12-06 DIAGNOSIS — M25632 Stiffness of left wrist, not elsewhere classified: Secondary | ICD-10-CM | POA: Diagnosis not present

## 2020-12-06 DIAGNOSIS — R531 Weakness: Secondary | ICD-10-CM | POA: Diagnosis not present

## 2020-12-06 DIAGNOSIS — M654 Radial styloid tenosynovitis [de Quervain]: Secondary | ICD-10-CM | POA: Diagnosis not present

## 2020-12-06 DIAGNOSIS — M79642 Pain in left hand: Secondary | ICD-10-CM | POA: Diagnosis not present

## 2020-12-19 DIAGNOSIS — Z85820 Personal history of malignant melanoma of skin: Secondary | ICD-10-CM | POA: Diagnosis not present

## 2020-12-19 DIAGNOSIS — L723 Sebaceous cyst: Secondary | ICD-10-CM | POA: Diagnosis not present

## 2020-12-19 DIAGNOSIS — L570 Actinic keratosis: Secondary | ICD-10-CM | POA: Diagnosis not present

## 2020-12-27 DIAGNOSIS — Z79899 Other long term (current) drug therapy: Secondary | ICD-10-CM | POA: Diagnosis not present

## 2020-12-27 DIAGNOSIS — K8681 Exocrine pancreatic insufficiency: Secondary | ICD-10-CM | POA: Diagnosis not present

## 2020-12-27 DIAGNOSIS — R109 Unspecified abdominal pain: Secondary | ICD-10-CM | POA: Diagnosis not present

## 2020-12-27 DIAGNOSIS — K219 Gastro-esophageal reflux disease without esophagitis: Secondary | ICD-10-CM | POA: Diagnosis not present

## 2020-12-27 DIAGNOSIS — K861 Other chronic pancreatitis: Secondary | ICD-10-CM | POA: Diagnosis not present

## 2020-12-27 DIAGNOSIS — Z8619 Personal history of other infectious and parasitic diseases: Secondary | ICD-10-CM | POA: Diagnosis not present

## 2020-12-28 DIAGNOSIS — M810 Age-related osteoporosis without current pathological fracture: Secondary | ICD-10-CM | POA: Diagnosis not present

## 2021-01-04 DIAGNOSIS — D485 Neoplasm of uncertain behavior of skin: Secondary | ICD-10-CM | POA: Diagnosis not present

## 2021-01-04 DIAGNOSIS — L720 Epidermal cyst: Secondary | ICD-10-CM | POA: Diagnosis not present

## 2021-01-10 DIAGNOSIS — Z8619 Personal history of other infectious and parasitic diseases: Secondary | ICD-10-CM | POA: Diagnosis not present

## 2021-03-08 DIAGNOSIS — M79675 Pain in left toe(s): Secondary | ICD-10-CM | POA: Diagnosis not present

## 2021-03-08 DIAGNOSIS — M79674 Pain in right toe(s): Secondary | ICD-10-CM | POA: Diagnosis not present

## 2021-03-08 DIAGNOSIS — M1990 Unspecified osteoarthritis, unspecified site: Secondary | ICD-10-CM | POA: Diagnosis not present

## 2021-03-08 DIAGNOSIS — M2021 Hallux rigidus, right foot: Secondary | ICD-10-CM | POA: Diagnosis not present

## 2021-03-08 DIAGNOSIS — M779 Enthesopathy, unspecified: Secondary | ICD-10-CM | POA: Diagnosis not present

## 2021-03-08 DIAGNOSIS — M2022 Hallux rigidus, left foot: Secondary | ICD-10-CM | POA: Diagnosis not present

## 2021-03-08 DIAGNOSIS — M79671 Pain in right foot: Secondary | ICD-10-CM | POA: Diagnosis not present

## 2021-03-08 DIAGNOSIS — M79672 Pain in left foot: Secondary | ICD-10-CM | POA: Diagnosis not present

## 2021-03-12 DIAGNOSIS — R109 Unspecified abdominal pain: Secondary | ICD-10-CM | POA: Diagnosis not present

## 2021-03-12 DIAGNOSIS — K219 Gastro-esophageal reflux disease without esophagitis: Secondary | ICD-10-CM | POA: Diagnosis not present

## 2021-03-12 DIAGNOSIS — N183 Chronic kidney disease, stage 3 unspecified: Secondary | ICD-10-CM | POA: Diagnosis not present

## 2021-03-12 DIAGNOSIS — K861 Other chronic pancreatitis: Secondary | ICD-10-CM | POA: Diagnosis not present

## 2021-03-12 DIAGNOSIS — E7800 Pure hypercholesterolemia, unspecified: Secondary | ICD-10-CM | POA: Diagnosis not present

## 2021-03-12 DIAGNOSIS — E7801 Familial hypercholesterolemia: Secondary | ICD-10-CM | POA: Diagnosis not present

## 2021-03-15 DIAGNOSIS — E7849 Other hyperlipidemia: Secondary | ICD-10-CM | POA: Diagnosis not present

## 2021-03-15 DIAGNOSIS — N401 Enlarged prostate with lower urinary tract symptoms: Secondary | ICD-10-CM | POA: Diagnosis not present

## 2021-03-15 DIAGNOSIS — R42 Dizziness and giddiness: Secondary | ICD-10-CM | POA: Diagnosis not present

## 2021-03-15 DIAGNOSIS — N138 Other obstructive and reflux uropathy: Secondary | ICD-10-CM | POA: Diagnosis not present

## 2021-03-15 DIAGNOSIS — K859 Acute pancreatitis without necrosis or infection, unspecified: Secondary | ICD-10-CM | POA: Diagnosis not present

## 2021-03-15 DIAGNOSIS — R4189 Other symptoms and signs involving cognitive functions and awareness: Secondary | ICD-10-CM | POA: Diagnosis not present

## 2021-03-15 DIAGNOSIS — H8190 Unspecified disorder of vestibular function, unspecified ear: Secondary | ICD-10-CM | POA: Diagnosis not present

## 2021-03-28 ENCOUNTER — Ambulatory Visit (INDEPENDENT_AMBULATORY_CARE_PROVIDER_SITE_OTHER): Payer: Medicare Other | Admitting: Orthopaedic Surgery

## 2021-03-28 ENCOUNTER — Ambulatory Visit (INDEPENDENT_AMBULATORY_CARE_PROVIDER_SITE_OTHER): Payer: Medicare Other

## 2021-03-28 ENCOUNTER — Other Ambulatory Visit: Payer: Self-pay

## 2021-03-28 ENCOUNTER — Encounter: Payer: Self-pay | Admitting: Orthopaedic Surgery

## 2021-03-28 VITALS — Ht 66.0 in | Wt 158.0 lb

## 2021-03-28 DIAGNOSIS — M25532 Pain in left wrist: Secondary | ICD-10-CM

## 2021-03-28 MED ORDER — BUPIVACAINE HCL 0.25 % IJ SOLN
2.0000 mL | INTRAMUSCULAR | Status: AC | PRN
  Administered 2021-03-28: 2 mL

## 2021-03-28 NOTE — Progress Notes (Signed)
Office Visit Note   Patient: Joshua Strong           Date of Birth: Feb 11, 1931           MRN: [id number] Visit Date: 03/28/2021              Requested by: Sasser, Silvestre Moment, MD [place],  [place] [address] PCP: [name], MD   Assessment & Plan: Visit Diagnoses:  1. Pain in left wrist     Plan: Joshua Strong relates that he sustained a left nondominant wrist fracture in January.  He thinks he passed out at home and landed on his wrist.  He went to the ER at [facility] and it was treated with a cast for about 11 days and then a splint.  Been wearing the splint since that time and relates that he was told that he had "bone-on-bone with arthritis and tendinitis".  He takes over-the-counter medicines and notes that he has mostly pain at night.  He does wear a wrist brace that helps.  I reviewed his most recent films.  He is had a transverse nonarticular fracture of the distal radius with some shortening and dorsal tilt.  Fracture is healed with abundant callus.  He also has significant arthritis along the radial carpus which may be contributing to his pain and possibly even de Quervain's.  I injected several areas of tenderness in the dorsum of the wrist and he felt much better.  I like him to remove the splint and begin range of motion exercises.  He can always use Voltaren gel and continue with the over-the-counter medicines.  He might be a candidate for physical therapy but I want to see how he responds to the injection.  He will let me know  Follow-Up Instructions: Return if symptoms worsen or fail to improve.   Orders:  Orders Placed This Encounter  Procedures   Hand/UE Inj   XR Wrist Complete Left   No orders of the defined types were placed in this encounter.     Procedures: Hand/UE Inj for osteoarthritis on 03/28/2021 12:01 PM Indications: pain Details: 25 G needle Medications: 2 mL bupivacaine 0.25 %  6 mg betamethasone injected with Marcaine into 2 locations in the  dorsum of the left hand correspond to the areas of discomfort.  The first is in the area of the x-ray findings of arthritis along the radial carpus and the other was along the distal radius     Clinical Data: No additional findings.   Subjective: Chief Complaint  Patient presents with   Left Wrist - Pain, Injury    January 2022  Patient presents today with left wrist pain. He states that he fell in January of this year and was taken to [facility] ED in [place]. He states that he was told he had fractured his wrist and was treated with a cast. He has since been told it has healed, but he continues to have pain. His pain is located at the lateral wrist and base of his thumb. It does swell daily. He wears a velcro wrist brace for support and when working outside he wears a custom wrist brace with thumb spica. He takes over the counter medicine as needed. He is right hand dominant.   HPI  Review of Systems   Objective: Vital Signs: Ht 5\' 6"  (1.676 m)   Wt 158 lb (71.7 kg)   BMI 25.50 kg/m   Physical Exam Constitutional:  Appearance: He is well-developed.  Pulmonary:     Effort: Pulmonary effort is normal.  Skin:    General: Skin is warm and dry.  Neurological:     Mental Status: He is alert and oriented to person, place, and time.  Psychiatric:        Behavior: Behavior normal.    Ortho Exam left nondominant wrist with some hypertrophic changes of the distal radius consistent with his fracture.  He had very little minimal radial deviation and some tenderness over the distal radius.  Also had an area of tenderness along the radial carpus just proximal to the basilar thumb joint.  Did have some pain with motion of the thumb and a positive Finkelstein's test.  There was some tenderness along the first dorsal extensor compartment.  Good capillary refill to the fingers.  Had some loss of flexion extension of the wrist but it did not appear to be a functional loss.  Skin intact.  Normal  sensation.  After injection had much better motion in significantly less pain  Specialty Comments:  No specialty comments available.  Imaging: XR Wrist Complete Left  Result Date: 03/28/2021 Films of the left nondominant wrist were obtained in several projections.  There is been a previous transverse distal radius fracture without intra-articular extension that has healed and slightly shortened.  The tip of the radius is still higher than the tip of the ulna.  There is about a 15 degree dorsal tilt of the radius.  There are degenerative changes at the base of the thumb at the carpometacarpal joint and in the radial carpus where there is considerable subchondral sclerosis    PMFS History: Patient Active Problem List   Diagnosis Date Noted   Pain in left wrist 03/28/2021   Cerebellar ataxia ([place]) 11/15/2020   Balance disorder 11/15/2020   Spinal stenosis of lumbar region with neurogenic claudication 09/10/2019   Lumbar radiculopathy 09/10/2019   Presence of right artificial knee joint 05/20/2017   Unilateral primary osteoarthritis, left knee 05/20/2017   History of total right knee replacement 02/17/2017   Chronic pain of left knee 02/17/2017   Osteoarthritis of right knee 05/24/2016   Status post total right knee replacement 05/24/2016   Diverticulosis of colon without hemorrhage    CONSTIPATION 04/19/2010   ABDOMINAL PAIN, LEFT LOWER QUADRANT 09/18/2009   EPIGASTRIC PAIN 09/18/2009   CARCINOMA, RENAL CELL 08/08/2008   PNEUMONIA 08/08/2008   ACUTE PANCREATITIS 08/08/2008   NAUSEA 08/08/2008   ABDOMINAL PAIN, CHRONIC 08/08/2008   Past Medical History:  Diagnosis Date   Arthritis    Dizziness    GERD (gastroesophageal reflux disease)    GI bleed    ulcer   Hemorrhoid    Memory loss    Pancreatitis    Pneumonia    Renal cell carcinoma ([place])    Syncope    Tremor    Tubular adenoma 2013    Family History  Adopted: Yes  Problem Relation Age of Onset   Cancer Mother      Past Surgical History:  Procedure Laterality Date   [place]   COLONOSCOPY  06/16/2007   Dr. Gala Romney- minimal internal hemorrhoids o/w normal rectum,tubular adenoma   COLONOSCOPY  07/02/2012   Dr.Rourk- normal rectum, 3 5-7 mm peduncuated polyos, one in the mid sigmoid segment and 2 in the ascending segment. remainder of the colonic mucosa appeared normal. bx= tubular  adenoma   COLONOSCOPY N/A 07/17/2015   Procedure: COLONOSCOPY;  Surgeon: Daneil Dolin, MD;  Location: [facility];  Service: Endoscopy;  Laterality: N/A;  730    EYE SURGERY     cataract with lens implants   HAND SURGERY Right 1989, 1991   KNEE SURGERY Right 2000   NEPHRECTOMY Right 2000   pancreatic duct stent     mult times   SHOULDER SURGERY Left 09/16/2011   SPINAL FUSION     testicular tumor  1989   TOTAL KNEE ARTHROPLASTY Right 05/24/2016   Procedure: RIGHT TOTAL KNEE ARTHROPLASTY;  Surgeon: Mcarthur Rossetti, MD;  Location: [facility];  Service: Orthopedics;  Laterality: Right;   Social History   Occupational History    Comment: retired  Tobacco Use   Smoking status: Former    Packs/day: 1.00    Years: 3.00    Pack years: 3.00    Types: Cigarettes    Quit date: 05/17/1956    Years since quitting: 64.9   Smokeless tobacco: Never   Tobacco comments:    quit in [place] Use   Vaping Use: Never used  Substance and Sexual Activity   Alcohol use: No    Comment: 08/11/19 "too much for 6 yrs", quit in 1958   Drug use: No   Sexual activity: Not on file

## 2021-03-29 DIAGNOSIS — M779 Enthesopathy, unspecified: Secondary | ICD-10-CM | POA: Diagnosis not present

## 2021-03-29 DIAGNOSIS — M79671 Pain in right foot: Secondary | ICD-10-CM | POA: Diagnosis not present

## 2021-03-29 DIAGNOSIS — M1990 Unspecified osteoarthritis, unspecified site: Secondary | ICD-10-CM | POA: Diagnosis not present

## 2021-03-29 DIAGNOSIS — M79672 Pain in left foot: Secondary | ICD-10-CM | POA: Diagnosis not present

## 2021-04-09 DIAGNOSIS — Z1152 Encounter for screening for COVID-19: Secondary | ICD-10-CM | POA: Diagnosis not present

## 2021-04-25 DIAGNOSIS — Z79899 Other long term (current) drug therapy: Secondary | ICD-10-CM | POA: Diagnosis not present

## 2021-04-25 DIAGNOSIS — K861 Other chronic pancreatitis: Secondary | ICD-10-CM | POA: Diagnosis not present

## 2021-04-25 DIAGNOSIS — K219 Gastro-esophageal reflux disease without esophagitis: Secondary | ICD-10-CM | POA: Diagnosis not present

## 2021-04-25 DIAGNOSIS — Z8619 Personal history of other infectious and parasitic diseases: Secondary | ICD-10-CM | POA: Diagnosis not present

## 2021-05-04 DIAGNOSIS — Z20828 Contact with and (suspected) exposure to other viral communicable diseases: Secondary | ICD-10-CM | POA: Diagnosis not present

## 2021-05-04 DIAGNOSIS — R059 Cough, unspecified: Secondary | ICD-10-CM | POA: Diagnosis not present

## 2021-05-22 DIAGNOSIS — Z1152 Encounter for screening for COVID-19: Secondary | ICD-10-CM | POA: Diagnosis not present

## 2021-06-08 DIAGNOSIS — Z1152 Encounter for screening for COVID-19: Secondary | ICD-10-CM | POA: Diagnosis not present

## 2021-06-18 DIAGNOSIS — L309 Dermatitis, unspecified: Secondary | ICD-10-CM | POA: Diagnosis not present

## 2021-06-18 DIAGNOSIS — L570 Actinic keratosis: Secondary | ICD-10-CM | POA: Diagnosis not present

## 2021-06-18 DIAGNOSIS — Z85820 Personal history of malignant melanoma of skin: Secondary | ICD-10-CM | POA: Diagnosis not present

## 2021-06-21 DIAGNOSIS — I739 Peripheral vascular disease, unspecified: Secondary | ICD-10-CM | POA: Diagnosis not present

## 2021-06-21 DIAGNOSIS — M79671 Pain in right foot: Secondary | ICD-10-CM | POA: Diagnosis not present

## 2021-06-21 DIAGNOSIS — M79672 Pain in left foot: Secondary | ICD-10-CM | POA: Diagnosis not present

## 2021-06-21 DIAGNOSIS — L110 Acquired keratosis follicularis: Secondary | ICD-10-CM | POA: Diagnosis not present

## 2021-06-21 DIAGNOSIS — M79674 Pain in right toe(s): Secondary | ICD-10-CM | POA: Diagnosis not present

## 2021-06-21 DIAGNOSIS — M79675 Pain in left toe(s): Secondary | ICD-10-CM | POA: Diagnosis not present

## 2021-07-03 DIAGNOSIS — M810 Age-related osteoporosis without current pathological fracture: Secondary | ICD-10-CM | POA: Diagnosis not present

## 2021-07-05 DIAGNOSIS — M353 Polymyalgia rheumatica: Secondary | ICD-10-CM | POA: Diagnosis not present

## 2021-07-05 DIAGNOSIS — K861 Other chronic pancreatitis: Secondary | ICD-10-CM | POA: Diagnosis not present

## 2021-07-05 DIAGNOSIS — Z85528 Personal history of other malignant neoplasm of kidney: Secondary | ICD-10-CM | POA: Diagnosis not present

## 2021-07-05 DIAGNOSIS — Z9181 History of falling: Secondary | ICD-10-CM | POA: Diagnosis not present

## 2021-07-05 DIAGNOSIS — M810 Age-related osteoporosis without current pathological fracture: Secondary | ICD-10-CM | POA: Diagnosis not present

## 2021-07-05 DIAGNOSIS — N289 Disorder of kidney and ureter, unspecified: Secondary | ICD-10-CM | POA: Diagnosis not present

## 2021-07-05 DIAGNOSIS — Z5181 Encounter for therapeutic drug level monitoring: Secondary | ICD-10-CM | POA: Diagnosis not present

## 2021-07-05 DIAGNOSIS — K219 Gastro-esophageal reflux disease without esophagitis: Secondary | ICD-10-CM | POA: Diagnosis not present

## 2021-07-05 DIAGNOSIS — Z7952 Long term (current) use of systemic steroids: Secondary | ICD-10-CM | POA: Diagnosis not present

## 2021-07-08 DIAGNOSIS — Z1152 Encounter for screening for COVID-19: Secondary | ICD-10-CM | POA: Diagnosis not present

## 2021-07-24 DIAGNOSIS — Z23 Encounter for immunization: Secondary | ICD-10-CM | POA: Diagnosis not present

## 2021-07-24 DIAGNOSIS — Z20828 Contact with and (suspected) exposure to other viral communicable diseases: Secondary | ICD-10-CM | POA: Diagnosis not present

## 2021-08-01 ENCOUNTER — Telehealth: Payer: Self-pay | Admitting: Physical Medicine and Rehabilitation

## 2021-08-01 DIAGNOSIS — F411 Generalized anxiety disorder: Secondary | ICD-10-CM

## 2021-08-01 DIAGNOSIS — M48062 Spinal stenosis, lumbar region with neurogenic claudication: Secondary | ICD-10-CM

## 2021-08-01 NOTE — Telephone Encounter (Signed)
Pt called requesting a call back to set an appt. Please call pt at [phone].

## 2021-08-01 NOTE — Telephone Encounter (Signed)
Bilateral L4 TF on 10/05/20. Ok to repeat if helped, same problem/side, and no new injury?

## 2021-08-02 MED ORDER — DIAZEPAM 5 MG PO TABS: ORAL_TABLET | ORAL | 0 refills | Status: DC

## 2021-08-02 NOTE — Telephone Encounter (Signed)
Patient would like valium prior to 11/21 injection. Pharmacy is correct.

## 2021-08-20 ENCOUNTER — Ambulatory Visit: Payer: Medicare Other | Admitting: Physical Medicine and Rehabilitation

## 2021-08-27 ENCOUNTER — Telehealth: Payer: Self-pay | Admitting: Physical Medicine and Rehabilitation

## 2021-08-27 ENCOUNTER — Telehealth: Payer: Self-pay

## 2021-08-27 MED ORDER — DIAZEPAM 5 MG PO TABS: ORAL_TABLET | ORAL | 0 refills | Status: DC

## 2021-08-27 NOTE — Telephone Encounter (Signed)
Pt called wondering if you can send in valium in for him before his appt tomorrow?   CB [phone]

## 2021-08-27 NOTE — Telephone Encounter (Signed)
Pt requested Rx for his inj tomorrow, please advise

## 2021-08-28 ENCOUNTER — Ambulatory Visit (INDEPENDENT_AMBULATORY_CARE_PROVIDER_SITE_OTHER): Payer: Medicare Other | Admitting: Physical Medicine and Rehabilitation

## 2021-08-28 ENCOUNTER — Ambulatory Visit: Payer: Medicare Other

## 2021-08-28 ENCOUNTER — Encounter: Payer: Self-pay | Admitting: Physical Medicine and Rehabilitation

## 2021-08-28 ENCOUNTER — Other Ambulatory Visit: Payer: Self-pay

## 2021-08-28 VITALS — BP 99/57 | HR 75

## 2021-08-28 DIAGNOSIS — M48062 Spinal stenosis, lumbar region with neurogenic claudication: Secondary | ICD-10-CM | POA: Diagnosis not present

## 2021-08-28 DIAGNOSIS — M5416 Radiculopathy, lumbar region: Secondary | ICD-10-CM

## 2021-08-28 MED ORDER — METHYLPREDNISOLONE ACETATE 80 MG/ML IJ SUSP
80.0000 mg | Freq: Once | INTRAMUSCULAR | Status: AC
Start: 2021-08-28 — End: 2021-08-28
  Administered 2021-08-28: 80 mg

## 2021-08-28 NOTE — Progress Notes (Signed)
Pt state lower back pain that travels down to his right leg and knee. Pt state getting out of bed make the pain worse. Pt state he take pain meds to help ease the pain  Numeric Pain Rating Scale and Functional Assessment Average Pain 8   In the last MONTH (on 0-10 scale) has pain interfered with the following?  1. General activity like being  able to carry out your everyday physical activities such as walking, climbing stairs, carrying groceries, or moving a chair?  Rating(10)   +Driver, -BT, -Dye Allergies.

## 2021-08-28 NOTE — Patient Instructions (Signed)

## 2021-08-29 DIAGNOSIS — Z20828 Contact with and (suspected) exposure to other viral communicable diseases: Secondary | ICD-10-CM | POA: Diagnosis not present

## 2021-09-04 DIAGNOSIS — E782 Mixed hyperlipidemia: Secondary | ICD-10-CM | POA: Diagnosis not present

## 2021-09-04 DIAGNOSIS — D529 Folate deficiency anemia, unspecified: Secondary | ICD-10-CM | POA: Diagnosis not present

## 2021-09-04 DIAGNOSIS — N183 Chronic kidney disease, stage 3 unspecified: Secondary | ICD-10-CM | POA: Diagnosis not present

## 2021-09-04 DIAGNOSIS — E7801 Familial hypercholesterolemia: Secondary | ICD-10-CM | POA: Diagnosis not present

## 2021-09-04 DIAGNOSIS — R5383 Other fatigue: Secondary | ICD-10-CM | POA: Diagnosis not present

## 2021-09-04 DIAGNOSIS — K219 Gastro-esophageal reflux disease without esophagitis: Secondary | ICD-10-CM | POA: Diagnosis not present

## 2021-09-04 DIAGNOSIS — E7849 Other hyperlipidemia: Secondary | ICD-10-CM | POA: Diagnosis not present

## 2021-09-04 DIAGNOSIS — Z1329 Encounter for screening for other suspected endocrine disorder: Secondary | ICD-10-CM | POA: Diagnosis not present

## 2021-09-04 DIAGNOSIS — K861 Other chronic pancreatitis: Secondary | ICD-10-CM | POA: Diagnosis not present

## 2021-09-04 DIAGNOSIS — Z1159 Encounter for screening for other viral diseases: Secondary | ICD-10-CM | POA: Diagnosis not present

## 2021-09-04 DIAGNOSIS — D519 Vitamin B12 deficiency anemia, unspecified: Secondary | ICD-10-CM | POA: Diagnosis not present

## 2021-09-06 DIAGNOSIS — I739 Peripheral vascular disease, unspecified: Secondary | ICD-10-CM | POA: Diagnosis not present

## 2021-09-06 DIAGNOSIS — L110 Acquired keratosis follicularis: Secondary | ICD-10-CM | POA: Diagnosis not present

## 2021-09-06 DIAGNOSIS — M79675 Pain in left toe(s): Secondary | ICD-10-CM | POA: Diagnosis not present

## 2021-09-06 DIAGNOSIS — M79672 Pain in left foot: Secondary | ICD-10-CM | POA: Diagnosis not present

## 2021-09-06 DIAGNOSIS — M79671 Pain in right foot: Secondary | ICD-10-CM | POA: Diagnosis not present

## 2021-09-06 DIAGNOSIS — M79674 Pain in right toe(s): Secondary | ICD-10-CM | POA: Diagnosis not present

## 2021-09-12 DIAGNOSIS — Z0001 Encounter for general adult medical examination with abnormal findings: Secondary | ICD-10-CM | POA: Diagnosis not present

## 2021-09-12 DIAGNOSIS — R4189 Other symptoms and signs involving cognitive functions and awareness: Secondary | ICD-10-CM | POA: Diagnosis not present

## 2021-09-12 DIAGNOSIS — H8190 Unspecified disorder of vestibular function, unspecified ear: Secondary | ICD-10-CM | POA: Diagnosis not present

## 2021-09-12 DIAGNOSIS — Z1331 Encounter for screening for depression: Secondary | ICD-10-CM | POA: Diagnosis not present

## 2021-09-12 DIAGNOSIS — Z23 Encounter for immunization: Secondary | ICD-10-CM | POA: Diagnosis not present

## 2021-09-12 DIAGNOSIS — Z1389 Encounter for screening for other disorder: Secondary | ICD-10-CM | POA: Diagnosis not present

## 2021-09-12 DIAGNOSIS — N138 Other obstructive and reflux uropathy: Secondary | ICD-10-CM | POA: Diagnosis not present

## 2021-09-12 DIAGNOSIS — N401 Enlarged prostate with lower urinary tract symptoms: Secondary | ICD-10-CM | POA: Diagnosis not present

## 2021-09-16 NOTE — Procedures (Signed)
Lumbosacral Transforaminal Epidural Steroid Injection - Sub-Pedicular Approach with Fluoroscopic Guidance  Patient: Joshua Strong      Date of Birth: [birth year] MRN: [id number] PCP: [name], MD      Visit Date: 08/28/2021   Universal Protocol:    Date/Time: 08/28/2021  Consent Given By: the patient  Position: PRONE  Additional Comments: Vital signs were monitored before and after the procedure. Patient was prepped and draped in the usual sterile fashion. The correct patient, procedure, and site was verified.   Injection Procedure Details:   Procedure diagnoses: Lumbar radiculopathy [M54.16]    Meds Administered:  Meds ordered this encounter  Medications   methylPREDNISolone acetate (DEPO-MEDROL) injection 80 mg    Laterality: Bilateral  Location/Site: L4  Needle:5.0 in., 22 ga.  Short bevel or Quincke spinal needle  Needle Placement: Transforaminal  Findings:    -Comments: Excellent flow of contrast along the nerve, nerve root and into the epidural space.  Procedure Details: After squaring off the end-plates to get a true AP view, the C-arm was positioned so that an oblique view of the foramen as noted above was visualized. The target area is just inferior to the "nose of the scotty dog" or sub pedicular. The soft tissues overlying this structure were infiltrated with 2-3 ml. of 1% Lidocaine without Epinephrine.  The spinal needle was inserted toward the target using a "trajectory" view along the fluoroscope beam.  Under AP and lateral visualization, the needle was advanced so it did not puncture dura and was located close the 6 O'Clock position of the pedical in AP tracterory. Biplanar projections were used to confirm position. Aspiration was confirmed to be negative for CSF and/or blood. A 1-2 ml. volume of Isovue-250 was injected and flow of contrast was noted at each level. Radiographs were obtained for documentation purposes.   After attaining the desired flow  of contrast documented above, a 0.5 to 1.0 ml test dose of 0.25% Marcaine was injected into each respective transforaminal space.  The patient was observed for 90 seconds post injection.  After no sensory deficits were reported, and normal lower extremity motor function was noted,   the above injectate was administered so that equal amounts of the injectate were placed at each foramen (level) into the transforaminal epidural space.   Additional Comments:  No complications occurred Dressing: 2 x 2 sterile gauze and Band-Aid    Post-procedure details: Patient was observed during the procedure. Post-procedure instructions were reviewed.  Patient left the clinic in stable condition.

## 2021-09-16 NOTE — Progress Notes (Signed)
Joshua Strong - 87 y.o. male MRN [id number]  Date of birth: 12-Oct-1930  Office Visit Note: Visit Date: 08/28/2021 PCP: [name], MD Referred by: Sasser, Silvestre Moment, MD  Subjective: Chief Complaint  Patient presents with   Lower Back - Pain   Right Leg - Pain   Right Knee - Pain   HPI:  Joshua Strong is a 87 y.o. male who comes in today for planned repeat Bilateral L4-5  Lumbar Transforaminal epidural steroid injection with fluoroscopic guidance.  The patient has failed conservative care including home exercise, medications, time and activity modification.  This injection will be diagnostic and hopefully therapeutic.  Please see requesting physician notes for further details and justification. Patient received more than 50% pain relief from prior injection.   Referring: Dr. Jean Rosenthal   ROS Otherwise per HPI.  Assessment & Plan: Visit Diagnoses:    ICD-10-CM   1. Lumbar radiculopathy  M54.16 XR C-ARM NO REPORT    Epidural Steroid injection    methylPREDNISolone acetate (DEPO-MEDROL) injection 80 mg    2. Spinal stenosis of lumbar region with neurogenic claudication  M48.062 XR C-ARM NO REPORT    Epidural Steroid injection    methylPREDNISolone acetate (DEPO-MEDROL) injection 80 mg      Plan: No additional findings.   Meds & Orders:  Meds ordered this encounter  Medications   methylPREDNISolone acetate (DEPO-MEDROL) injection 80 mg    Orders Placed This Encounter  Procedures   XR C-ARM NO REPORT   Epidural Steroid injection    Follow-up: Return if symptoms worsen or fail to improve.   Procedures: No procedures performed  Lumbosacral Transforaminal Epidural Steroid Injection - Sub-Pedicular Approach with Fluoroscopic Guidance  Patient: Joshua Strong      Date of Birth: 1930-10-11 MRN: [id number] PCP: [name], MD      Visit Date: 08/28/2021   Universal Protocol:    Date/Time: 08/28/2021  Consent Given By: the patient  Position: PRONE  Additional  Comments: Vital signs were monitored before and after the procedure. Patient was prepped and draped in the usual sterile fashion. The correct patient, procedure, and site was verified.   Injection Procedure Details:   Procedure diagnoses: Lumbar radiculopathy [M54.16]    Meds Administered:  Meds ordered this encounter  Medications   methylPREDNISolone acetate (DEPO-MEDROL) injection 80 mg    Laterality: Bilateral  Location/Site: L4  Needle:5.0 in., 22 ga.  Short bevel or Quincke spinal needle  Needle Placement: Transforaminal  Findings:    -Comments: Excellent flow of contrast along the nerve, nerve root and into the epidural space.  Procedure Details: After squaring off the end-plates to get a true AP view, the C-arm was positioned so that an oblique view of the foramen as noted above was visualized. The target area is just inferior to the "nose of the scotty dog" or sub pedicular. The soft tissues overlying this structure were infiltrated with 2-3 ml. of 1% Lidocaine without Epinephrine.  The spinal needle was inserted toward the target using a "trajectory" view along the fluoroscope beam.  Under AP and lateral visualization, the needle was advanced so it did not puncture dura and was located close the 6 O'Clock position of the pedical in AP tracterory. Biplanar projections were used to confirm position. Aspiration was confirmed to be negative for CSF and/or blood. A 1-2 ml. volume of Isovue-250 was injected and flow of contrast was noted at each level. Radiographs were obtained for documentation purposes.  After attaining the desired flow of contrast documented above, a 0.5 to 1.0 ml test dose of 0.25% Marcaine was injected into each respective transforaminal space.  The patient was observed for 90 seconds post injection.  After no sensory deficits were reported, and normal lower extremity motor function was noted,   the above injectate was administered so that equal amounts of  the injectate were placed at each foramen (level) into the transforaminal epidural space.   Additional Comments:  No complications occurred Dressing: 2 x 2 sterile gauze and Band-Aid    Post-procedure details: Patient was observed during the procedure. Post-procedure instructions were reviewed.  Patient left the clinic in stable condition.    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 At L2-3 with mild to moderate central narrowing with left-sided lateral recess narrowing. At L4-5 there is moderate multifactorial stenosis and facet arthropathy with lateral recess narrowing. At L5-S1 he is status post laminectomy with bulky fibrosis but otherwise open canal.     Objective:  VS:  HT:     WT:    BMI:      BP:(!) 99/57   HR:75bpm   TEMP: ( )   RESP:  Physical Exam Vitals and nursing note reviewed.  Constitutional:      General: He is not in acute distress.    Appearance: Normal appearance. He is not ill-appearing.  HENT:     Head: Normocephalic and atraumatic.     Right Ear: External ear normal.     Left Ear: External ear normal.     Nose: No congestion.  Eyes:     Extraocular Movements: Extraocular movements intact.  Cardiovascular:     Rate and Rhythm: Normal rate.     Pulses: Normal pulses.  Pulmonary:     Effort: Pulmonary effort is normal. No respiratory distress.  Abdominal:     General: There is no distension.     Palpations: Abdomen is soft.  Musculoskeletal:        General: No tenderness or signs of injury.     Cervical back: Neck supple.     Right lower leg: No edema.     Left lower leg: No edema.     Comments: Patient has good distal strength without clonus.  Skin:    Findings: No erythema or rash.  Neurological:     General: No focal deficit present.     Mental Status: He is alert and oriented to person, place, and time.     Sensory: No sensory deficit.     Motor: No  weakness or abnormal muscle tone.     Coordination: Coordination normal.  Psychiatric:        Mood and Affect: Mood normal.        Behavior: Behavior normal.     Imaging: No results found.

## 2021-11-22 DIAGNOSIS — M79672 Pain in left foot: Secondary | ICD-10-CM | POA: Diagnosis not present

## 2021-11-22 DIAGNOSIS — M79675 Pain in left toe(s): Secondary | ICD-10-CM | POA: Diagnosis not present

## 2021-11-22 DIAGNOSIS — M79671 Pain in right foot: Secondary | ICD-10-CM | POA: Diagnosis not present

## 2021-11-22 DIAGNOSIS — I739 Peripheral vascular disease, unspecified: Secondary | ICD-10-CM | POA: Diagnosis not present

## 2021-11-22 DIAGNOSIS — L110 Acquired keratosis follicularis: Secondary | ICD-10-CM | POA: Diagnosis not present

## 2021-11-22 DIAGNOSIS — M79674 Pain in right toe(s): Secondary | ICD-10-CM | POA: Diagnosis not present

## 2021-11-27 DIAGNOSIS — H353111 Nonexudative age-related macular degeneration, right eye, early dry stage: Secondary | ICD-10-CM | POA: Diagnosis not present

## 2021-12-01 IMAGING — MR MR LUMBAR SPINE W/O CM
4 of 5 series · 27 of 48 positions shown · non-contrast
Comparison: None.

CLINICAL DATA: Lumbar back pain for 6 cc

EXAM:
MRI LUMBAR SPINE WITHOUT CONTRAST
TECHNIQUE: Multiplanar, multisequence MR imaging of the lumbar spine was
performed. No intravenous contrast was administered.

[Series 2: T2 · sagittal · 4.0mm · 1.09mm/px · 6 of 17 slices shown (1 of 2)]
[im 1/17]
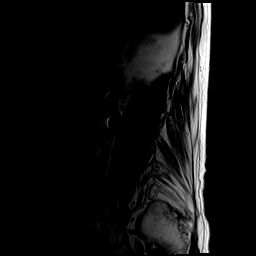
[im 4/17]
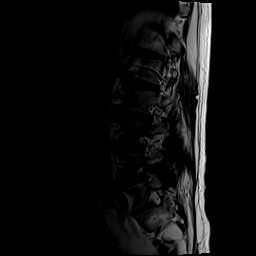
[im 7/17]
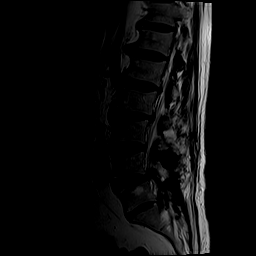
[im 10/17]
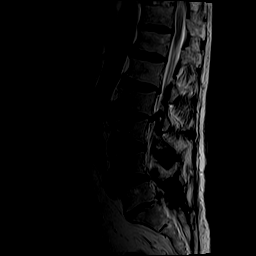
[im 13/17]
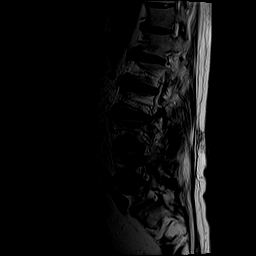
[im 17/17]
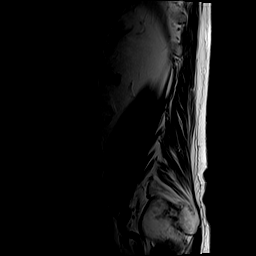

[Series 4: T1 · sagittal · 4.0mm · 1.09mm/px · 6 of 17 slices shown (1 of 2)]
[im 1/17]
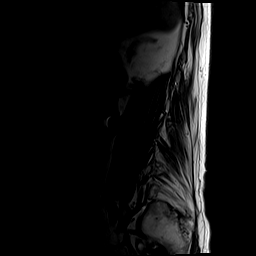
[im 4/17]
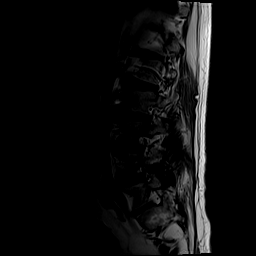
[im 7/17]
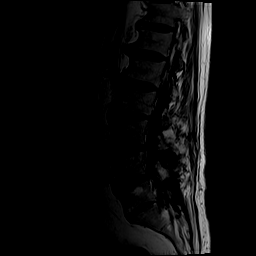
[im 10/17]
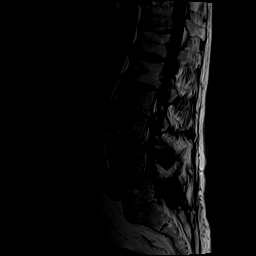
[im 13/17]
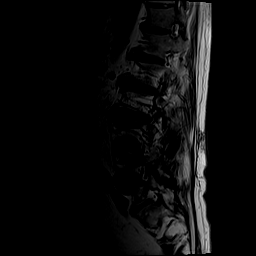
[im 17/17]
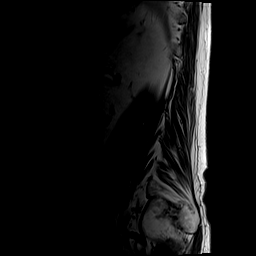

[Series 5: T2 · axial · 4.0mm · 0.39mm/px · z∈[-80,+127]mm · 9 of 40 slices shown (2 of 2)]
[im 1/40]
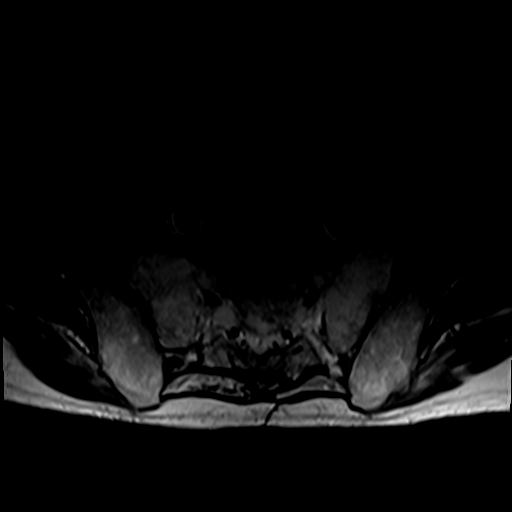
[im 6/40]
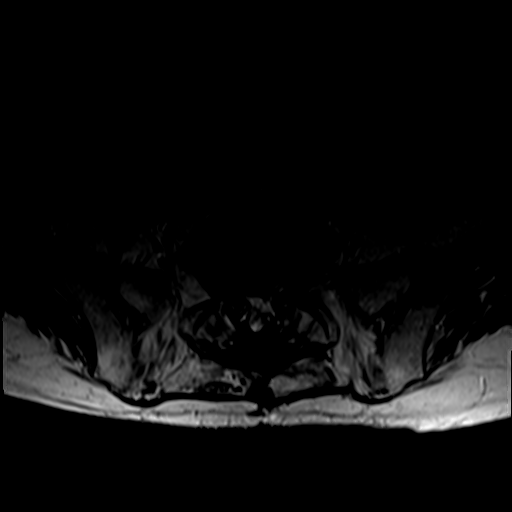
[im 12/40]
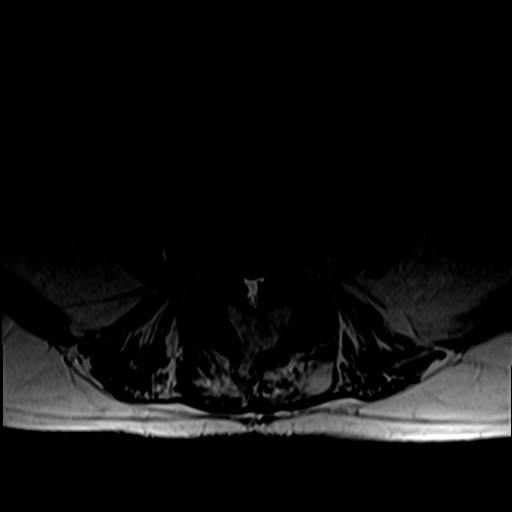
[im 17/40]
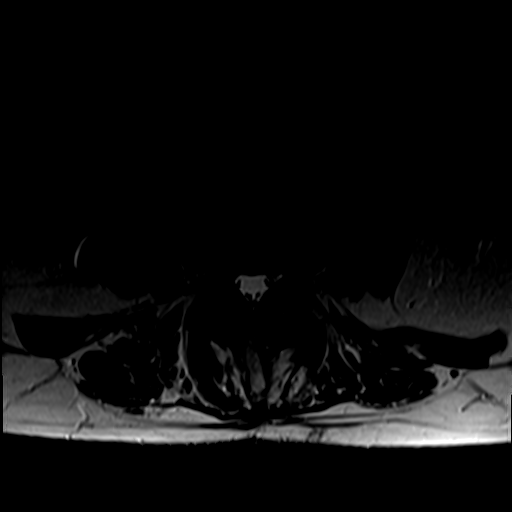
[im 20/40]
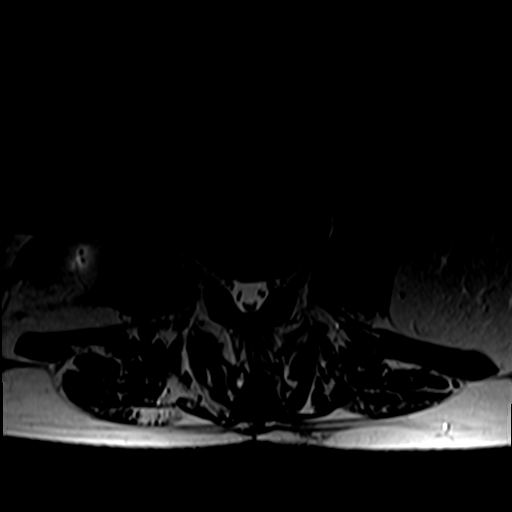
[im 23/40]
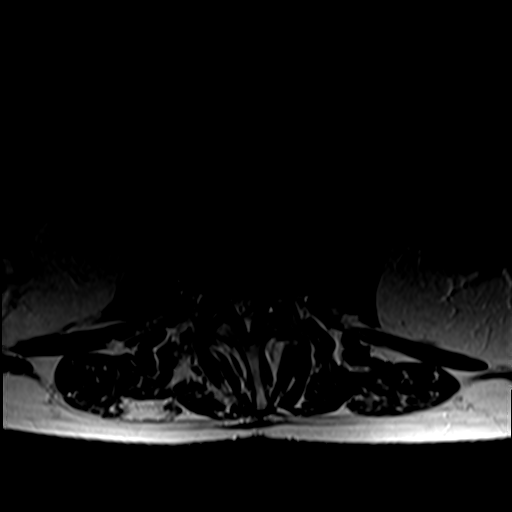
[im 28/40]
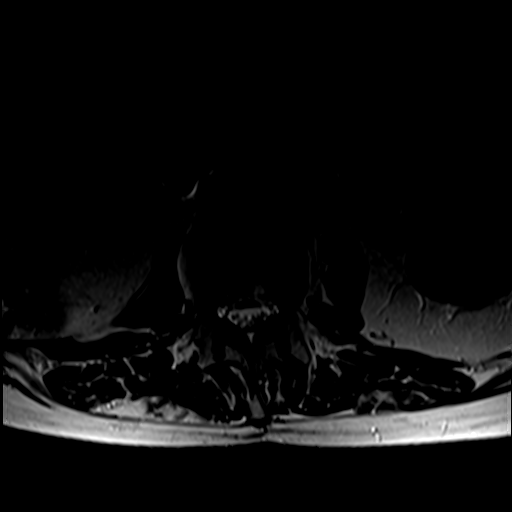
[im 34/40]
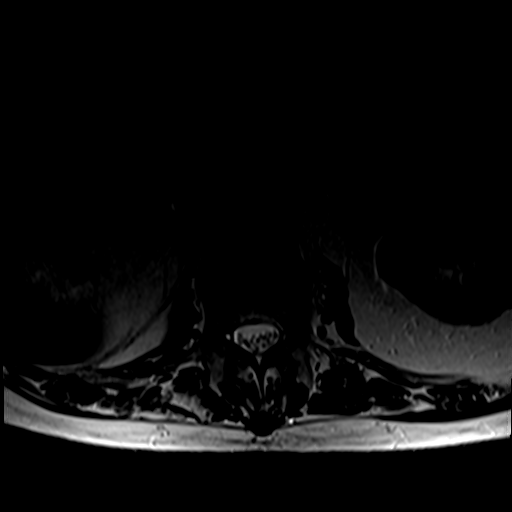
[im 40/40]
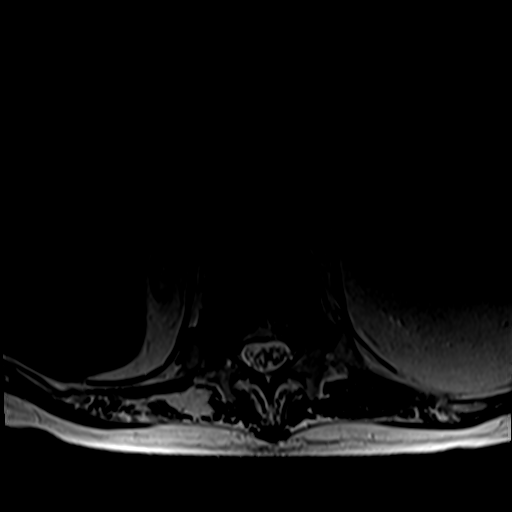

[Series 6: T1 · axial · 4.0mm · 0.39mm/px · z∈[-80,+98]mm · 6 of 40 slices shown (2 of 2)]
[im 1/40]
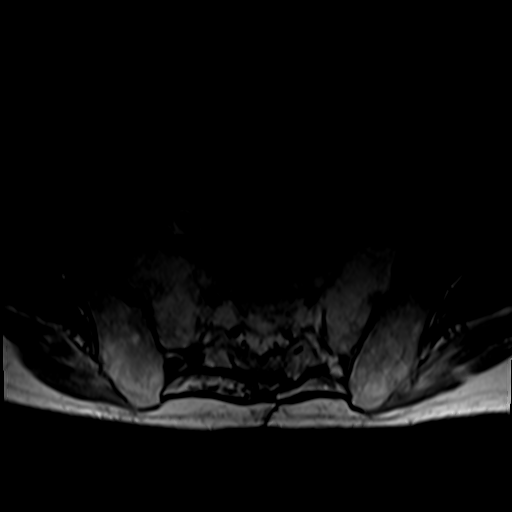
[im 6/40]
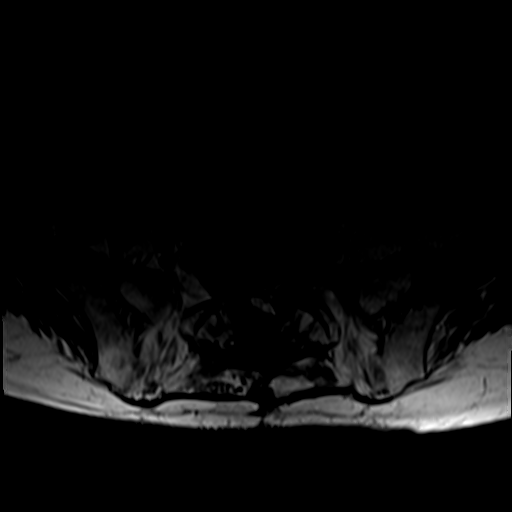
[im 12/40]
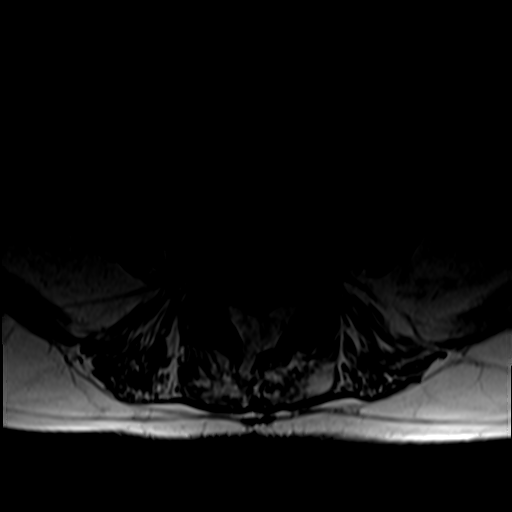
[im 17/40]
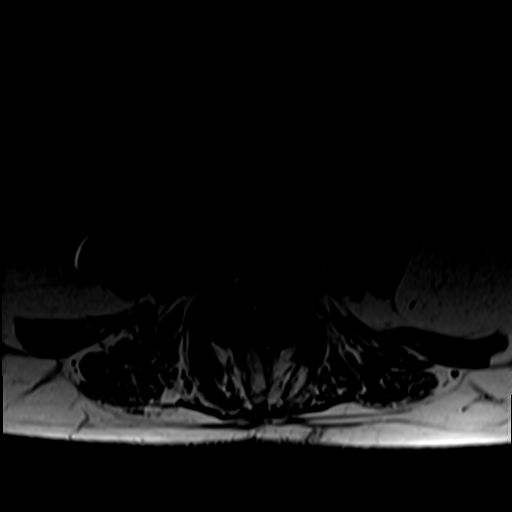
[im 20/40]
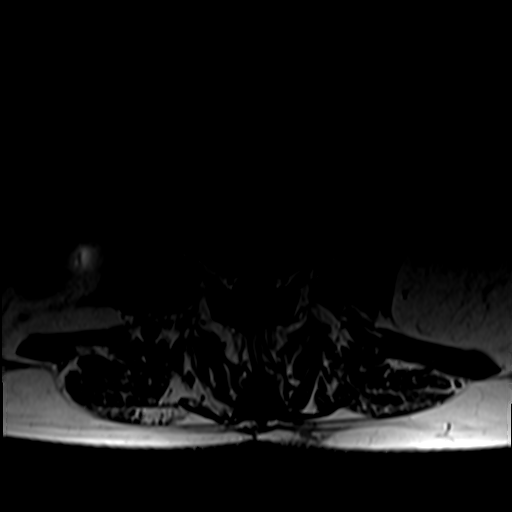
[im 34/40]
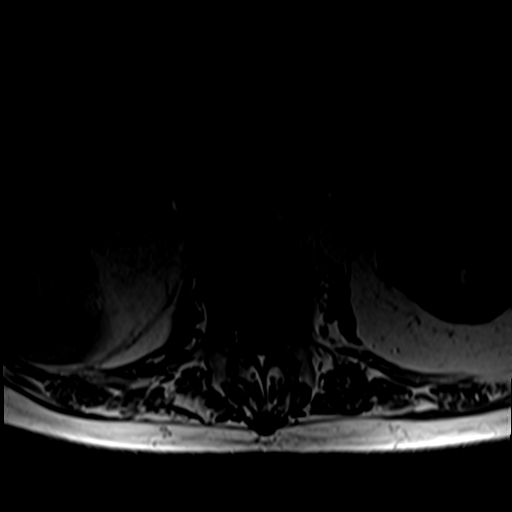

[27 of 48 positions shown; findings below may reference images not displayed]

FINDINGS: Segmentation:  5 lumbar type vertebrae.

Alignment:  L4-5 anterolisthesis, grade 1.  Mild dextrocurvature.

Vertebrae: No fracture, evidence of discitis, or bone lesion.
Laminectomy at L5

Conus medullaris and cauda equina: Conus extends to the T12 level.
Conus and cauda equina appear normal.

Paraspinal and other soft tissues: Postoperative scarring
posteriorly at L5.

Disc levels:

T12- L1: Spondylosis.  No impingement

L1-L2: Spondylosis with disc narrowing and circumferential bulging.
Mild spinal stenosis. Patent foramina

L2-L3: Disc narrowing and bulging which is left eccentric. Mild
facet spurring and ligamentum flavum thickening. Moderate spinal
stenosis. Mild left foraminal narrowing.

L3-L4: Degenerative facet spurring on both sides. Negative disc
space. No neural impingement

L4-L5: Facet osteoarthritis with bulky spurring and mild
anterolisthesis. The disc is narrowed and bulging. Moderate to
advanced spinal stenosis. Bilateral L5 impingement in the
subarticular recesses

L5-S1:Greatest level of degenerative disc narrowing. There has been
laminectomy and probable posterior-lateral arthrodesis. Narrow
thecal sac but no focal neural compression.
IMPRESSION: 1. L4-5 advanced facet osteoarthritis with anterolisthesis and
moderate to advanced spinal stenosis. There is impingement of both
L5 nerve roots at the subarticular recesses.
2. L2-3 moderate spinal stenosis.
3. L5 S1 laminectomy and posterior-lateral arthrodesis.

## 2021-12-12 NOTE — Progress Notes (Signed)
?[facility] Note ? ?12/14/2021 ? ?  ? ?CHIEF COMPLAINT ?Patient presents for Retina Evaluation ? ? ?HISTORY OF PRESENT ILLNESS: ?Joshua Strong is a 88 y.o. male who presents to the clinic today for:  ? ?HPI   ? ? Retina Evaluation   ?In both eyes.  This started 1.5 years ago.  Duration of 1.5 years.  Context:  distance vision.  I, the attending physician,  performed the HPI with the patient and updated documentation appropriately. ? ?  ?  ? ? Comments   ?Patient here for Retina Evaluation. Referred by Dr Lanell Matar. Patient states vision is pretty good. OD bets blurry more so. OS is bright. Some days blurrier than other days. Can't wear glasses at night gets glare. Has problems when it is raining and headlights. Has pain at temple on right side. On finesteride, tamsulosen. Vit C, Zinc Prolia every 6 months. Systane gel BID OU, refresh optive prn. ? ?  ?  ?Last edited by Bernarda Caffey, MD on 12/15/2021  6:00 PM.  ?  ?Previously followed by Dr. Zigmund Daniel, s/p retinal break and CME OS, last appt was 10.12.15, pt is back on the referral of Dr. Rosana Hoes, pt states his right eye vision gets blurry / fuzzy, he states left eye is much brighter than the right, he has had cataract sx OU with Dr. Dolores Lory, pt also complains of headaches and right side temple pain. Denies jaw claudication, scalp tenderness. ? ?Referring physician: ?Leticia Clas, OD ?[address]  ?[place], [place] [id number] ? ?HISTORICAL INFORMATION:  ? ?Selected notes from the [facility] ?Referred by Dr. Rosana Hoes -- concern for GCA ?LEE:  ?Ocular Hx- ?PMH- ?  ? ?CURRENT MEDICATIONS: ?No current outpatient medications on file. (Ophthalmic Drugs)  ? ?No current facility-administered medications for this visit. (Ophthalmic Drugs)  ? ?Current Outpatient Medications (Other)  ?Medication Sig  ? acetaminophen (TYLENOL) 500 MG tablet Take 500 mg by mouth every 6 (six) hours as needed for mild pain.  ? Multiple Vitamin (MULTIVITAMIN)  tablet Take 1 tablet by mouth daily.  ? pantoprazole (PROTONIX) 20 MG tablet Take 1 tablet (20 mg total) by mouth daily.  ? polyethylene glycol (MIRALAX / GLYCOLAX) 17 g packet Take 17 g by mouth daily.  ? PRESCRIPTION MEDICATION Injection every 6 months for osteoporosis (receives at [facility])  ? Apoaequorin (PREVAGEN PO) Take by mouth daily. (Patient not taking: Reported on 12/14/2021)  ? aspirin 81 MG chewable tablet Chew 81 mg by mouth daily. (Patient not taking: Reported on 12/14/2021)  ? cyclobenzaprine (FLEXERIL) 10 MG tablet Take 10 mg by mouth 3 (three) times daily as needed for muscle spasms. (Patient not taking: Reported on 12/14/2021)  ? diazepam (VALIUM) 5 MG tablet Take 1 by mouth 1 hour  pre-procedure with very light food. May bring 2nd tablet to appointment. (Patient not taking: Reported on 12/14/2021)  ? OVER THE COUNTER MEDICATION Super Beta Prostate twice daily (Patient not taking: Reported on 12/14/2021)  ? ?No current facility-administered medications for this visit. (Other)  ? ?REVIEW OF SYSTEMS: ?ROS   ?Positive for: Eyes ?Negative for: Constitutional, Gastrointestinal, Neurological, Skin, Genitourinary, Musculoskeletal, HENT, Endocrine, Cardiovascular, Respiratory, Psychiatric, Allergic/Imm, Heme/Lymph ?Last edited by Kingsley Spittle, COT on 12/14/2021  9:26 AM.  ?  ? ?ALLERGIES ?Allergies  ?Allergen Reactions  ? Morphine And Related Other (See Comments)  ?  Hallucinations.  ? Metoclopramide Hcl Rash  ? ?PAST MEDICAL HISTORY ?Past Medical History:  ?  Diagnosis Date  ? Arthritis   ? Dizziness   ? GERD (gastroesophageal reflux disease)   ? GI bleed   ? ulcer  ? Hemorrhoid   ? Memory loss   ? Pancreatitis   ? Pneumonia   ? Renal cell carcinoma ([place])   ? Syncope   ? Tremor   ? Tubular adenoma 2013  ? ?Past Surgical History:  ?Procedure Laterality Date  ? APPENDECTOMY  1975  ? [place]  ? CHOLECYSTECTOMY  1975  ? COLONOSCOPY  06/16/2007  ? Dr. Gala Romney- minimal internal  hemorrhoids o/w normal rectum,tubular adenoma  ? COLONOSCOPY  07/02/2012  ? Dr.Rourk- normal rectum, 3 5-7 mm peduncuated polyos, one in the mid sigmoid segment and 2 in the ascending segment. remainder of the colonic mucosa appeared normal. bx= tubular adenoma  ? COLONOSCOPY N/A 07/17/2015  ? Procedure: COLONOSCOPY;  Surgeon: Daneil Dolin, MD;  Location: [facility];  Service: Endoscopy;  Laterality: N/A;  730 ?  ? EYE SURGERY    ? cataract with lens implants  ? HAND SURGERY Right 1989, 1991  ? KNEE SURGERY Right 2000  ? NEPHRECTOMY Right 2000  ? pancreatic duct stent    ? mult times  ? SHOULDER SURGERY Left 09/16/2011  ? SPINAL FUSION    ? testicular tumor  1989  ? TOTAL KNEE ARTHROPLASTY Right 05/24/2016  ? Procedure: RIGHT TOTAL KNEE ARTHROPLASTY;  Surgeon: Mcarthur Rossetti, MD;  Location: [facility];  Service: Orthopedics;  Laterality: Right;  ? ?FAMILY HISTORY ?Family History  ?Adopted: Yes  ?Problem Relation Age of Onset  ? Cancer Mother   ? ?SOCIAL HISTORY ?Social History  ? ?Tobacco Use  ? Smoking status: Former  ?  Packs/day: 1.00  ?  Years: 3.00  ?  Pack years: 3.00  ?  Types: Cigarettes  ?  Quit date: 05/17/1956  ?  Years since quitting: 65.6  ? Smokeless tobacco: Never  ? Tobacco comments:  ?  quit in 1958  ?Vaping Use  ? Vaping Use: Never used  ?Substance Use Topics  ? Alcohol use: No  ?  Comment: 08/11/19 "too much for 6 yrs", quit in 1958  ? Drug use: No  ?  ? ?  ?OPHTHALMIC EXAM: ? ?Base Eye Exam   ? ? Visual Acuity (Snellen - Linear)   ? ?   Right Left  ? Dist cc 20/20 20/20 -1  ? ? Correction: Glasses  ? ?  ?  ? ? Tonometry (Tonopen, 9:02 AM)   ? ?   Right Left  ? Pressure 16 17  ? ?  ?  ? ? Pupils   ? ?   Dark Light Shape React APD  ? Right 1.5 1 Round Minimal None  ? Left 1.5 1 Round Minimal None  ? ?  ?  ? ? Visual Fields (Counting fingers)   ? ?   Left Right  ?  Full Full  ? ?  ?  ? ? Extraocular Movement   ? ?   Right Left  ?  Full, Ortho Full, Ortho  ? ?  ?  ? ? Neuro/Psych   ? ? Oriented  x3: Yes  ? Mood/Affect: Normal  ? ?  ?  ? ? Dilation   ? ? Both eyes: 1.0% Mydriacyl, 2.5% Phenylephrine @ 9:02 AM  ? ?  ?  ? ?  ? ?Slit Lamp and Fundus Exam   ? ? Slit Lamp  Exam   ? ?   Right Left  ? Lids/Lashes Dermatochalasis - upper lid Dermatochalasis - upper lid, Meibomian gland dysfunction  ? Conjunctiva/Sclera White and quiet White and quiet  ? Cornea arcus, well healed cataract wound, nasal LRI Mild arcus, well healed cataract wound, nasal LRI, trace PEE  ? Anterior Chamber Deep and quiet Deep and quiet  ? Iris round and poorly dilated to 3.5, Transillumination defects from 0130-0330 round and poorly dilated to 3.5, faint peripheral TID from 0245-0400  ? Lens PC IOL in good position with open PC PC IOL in good position  ? Anterior Vitreous Vitreous syneresis Vitreous syneresis, Posterior vitreous detachment, vitreous condensations  ? ?  ?  ? ? Fundus Exam   ? ?   Right Left  ? Disc Pink and Sharp mild Pallor, Sharp rim  ? C/D Ratio  0.4  ? Macula Flat, Blunted foveal reflex, Drusen, RPE mottling, rare MA, no edema Flat, Blunted foveal reflex, Drusen, RPE mottling and clumping, No heme or edema  ? Vessels attenuated, mild Copper wiring attenuated, mild AV crossing changes  ? Periphery Attached, reticular degeneration, No RT/RD Attached, retinal tear with pigmented laser scars surrounding at 0300, reticular degeneration, no new RT/RD  ? ?  ?  ? ?  ? ?Refraction   ? ? Wearing Rx   ? ?   Sphere Cylinder Axis Add  ? Right -1.25 +1.75 168 +2.50  ? Left -0.75 +0.75 031 +2.50  ? ?  ?  ? ? Manifest Refraction   ? ?   Sphere Cylinder Axis Dist VA  ? Right -1.25 +2.00 175 20/20  ? Left -0.75 +0.75 030 20/20  ? ?  ?  ? ?  ? ?IMAGING AND PROCEDURES  ?Imaging and Procedures for 12/14/2021 ? ?OCT, Retina - OU - Both Eyes   ? ?   ?Right Eye ?Quality was good. Central Foveal Thickness: 243. Progression has been stable. Findings include normal foveal contour, no IRF, no SRF.  ? ?Left Eye ?Quality was good. Central Foveal  Thickness: 240. Findings include normal foveal contour, no IRF, no SRF, retinal drusen (Focal prominent druse IN to fovea).  ? ?Notes ?*Images captured and stored on drive ? ?Diagnosis / Impression:  ?NFP, no IRF/

## 2021-12-14 ENCOUNTER — Ambulatory Visit (INDEPENDENT_AMBULATORY_CARE_PROVIDER_SITE_OTHER): Payer: Medicare Other | Admitting: Ophthalmology

## 2021-12-14 ENCOUNTER — Encounter (INDEPENDENT_AMBULATORY_CARE_PROVIDER_SITE_OTHER): Payer: Self-pay | Admitting: Ophthalmology

## 2021-12-14 ENCOUNTER — Other Ambulatory Visit: Payer: Self-pay

## 2021-12-14 DIAGNOSIS — Z9889 Other specified postprocedural states: Secondary | ICD-10-CM | POA: Diagnosis not present

## 2021-12-14 DIAGNOSIS — Z8669 Personal history of other diseases of the nervous system and sense organs: Secondary | ICD-10-CM

## 2021-12-14 DIAGNOSIS — H35033 Hypertensive retinopathy, bilateral: Secondary | ICD-10-CM

## 2021-12-14 DIAGNOSIS — H3581 Retinal edema: Secondary | ICD-10-CM

## 2021-12-14 DIAGNOSIS — I10 Essential (primary) hypertension: Secondary | ICD-10-CM

## 2021-12-14 DIAGNOSIS — Z961 Presence of intraocular lens: Secondary | ICD-10-CM | POA: Diagnosis not present

## 2021-12-14 DIAGNOSIS — H353132 Nonexudative age-related macular degeneration, bilateral, intermediate dry stage: Secondary | ICD-10-CM | POA: Diagnosis not present

## 2021-12-15 ENCOUNTER — Encounter (INDEPENDENT_AMBULATORY_CARE_PROVIDER_SITE_OTHER): Payer: Self-pay | Admitting: Ophthalmology

## 2021-12-17 DIAGNOSIS — Z85820 Personal history of malignant melanoma of skin: Secondary | ICD-10-CM | POA: Diagnosis not present

## 2021-12-17 DIAGNOSIS — L570 Actinic keratosis: Secondary | ICD-10-CM | POA: Diagnosis not present

## 2021-12-17 DIAGNOSIS — L280 Lichen simplex chronicus: Secondary | ICD-10-CM | POA: Diagnosis not present

## 2022-02-18 ENCOUNTER — Telehealth: Payer: Self-pay | Admitting: Physical Medicine and Rehabilitation

## 2022-02-18 ENCOUNTER — Other Ambulatory Visit: Payer: Self-pay | Admitting: Physical Medicine and Rehabilitation

## 2022-02-18 MED ORDER — DIAZEPAM 5 MG PO TABS: ORAL_TABLET | ORAL | 0 refills | Status: DC

## 2022-02-18 NOTE — Telephone Encounter (Signed)
Patient called needing to schedule an appointment with Dr. Ernestina Patches for an injection in his back. The number to contact patient is [phone]

## 2022-02-18 NOTE — Progress Notes (Signed)
Prescription placed for procedure Valium.

## 2022-02-26 DIAGNOSIS — I739 Peripheral vascular disease, unspecified: Secondary | ICD-10-CM | POA: Diagnosis not present

## 2022-02-26 DIAGNOSIS — M79674 Pain in right toe(s): Secondary | ICD-10-CM | POA: Diagnosis not present

## 2022-02-26 DIAGNOSIS — L110 Acquired keratosis follicularis: Secondary | ICD-10-CM | POA: Diagnosis not present

## 2022-02-26 DIAGNOSIS — M79672 Pain in left foot: Secondary | ICD-10-CM | POA: Diagnosis not present

## 2022-02-26 DIAGNOSIS — M79671 Pain in right foot: Secondary | ICD-10-CM | POA: Diagnosis not present

## 2022-02-26 DIAGNOSIS — M79675 Pain in left toe(s): Secondary | ICD-10-CM | POA: Diagnosis not present

## 2022-02-27 NOTE — Progress Notes (Addendum)
[facility] Note  03/04/2022     CHIEF COMPLAINT Patient presents for Retina Follow Up   HISTORY OF PRESENT ILLNESS: Joshua Strong is a 88 y.o. male who presents to the clinic today for:   HPI     Retina Follow Up   Patient presents with  Dry AMD.  In both eyes.  This started months ago.  Severity is mild.  Duration of 3 months.  Since onset it is stable.  I, the attending physician,  performed the HPI with the patient and updated documentation appropriately.        Comments   Patient states that their has been a decrease in vision with the right eye. He has seen a a black circle in the center of his vision and the circle does not move in the left eye. The right eye the vision gets fuzzy. He states that he sees floaters in the left eye.       Last edited by Bernarda Caffey, MD on 03/04/2022  1:34 PM.    Patient states vision has decreased OD. Had black circle in center of vision OS that dd not move, but does not see now. Sees some floaters OS.   Referring physician: Leticia Clas, [address]  [place], [place] [address]  HISTORICAL INFORMATION:   Selected notes from the MEDICAL RECORD NUMBER Referred by Dr. Rosana Hoes -- concern for GCA LEE:  Ocular Hx- PMH-    CURRENT MEDICATIONS: No current outpatient medications on file. (Ophthalmic Drugs)   No current facility-administered medications for this visit. (Ophthalmic Drugs)   Current Outpatient Medications (Other)  Medication Sig   acetaminophen (TYLENOL) 500 MG tablet Take 500 mg by mouth every 6 (six) hours as needed for mild pain.   pantoprazole (PROTONIX) 20 MG tablet Take 1 tablet (20 mg total) by mouth daily.   polyethylene glycol (MIRALAX / GLYCOLAX) 17 g packet Take 17 g by mouth daily.   PRESCRIPTION MEDICATION Injection every 6 months for osteoporosis (receives at [facility])   Apoaequorin (PREVAGEN PO) Take by mouth daily. (Patient not taking: Reported on 12/14/2021)   aspirin 81  MG chewable tablet Chew 81 mg by mouth daily.   cyclobenzaprine (FLEXERIL) 10 MG tablet Take 10 mg by mouth 3 (three) times daily as needed for muscle spasms. (Patient not taking: Reported on 12/14/2021)   diazepam (VALIUM) 5 MG tablet Take one tablet by mouth with food one hour prior to procedure. May repeat 30 minutes prior if needed.   Multiple Vitamin (MULTIVITAMIN) tablet Take 1 tablet by mouth daily. (Patient not taking: Reported on 03/04/2022)   OVER THE COUNTER MEDICATION Super Beta Prostate twice daily (Patient not taking: Reported on 12/14/2021)   No current facility-administered medications for this visit. (Other)   REVIEW OF SYSTEMS:   ALLERGIES Allergies  Allergen Reactions   Morphine And Related Other (See Comments)    Hallucinations.   Metoclopramide Hcl Rash   PAST MEDICAL HISTORY Past Medical History:  Diagnosis Date   Arthritis    Dizziness    GERD (gastroesophageal reflux disease)    GI bleed    ulcer   Hemorrhoid    Memory loss    Pancreatitis    Pneumonia    Renal cell carcinoma (HCC)    Syncope    Tremor    Tubular adenoma 2013   Past Surgical History:  Procedure Laterality Date   APPENDECTOMY  1975   BACK SURGERY  [place], [place]   COLONOSCOPY  06/16/2007   Dr. Gala Romney- minimal internal hemorrhoids o/w normal rectum,tubular adenoma   COLONOSCOPY  07/02/2012   Dr.Rourk- normal rectum, 3 5-7 mm peduncuated polyos, one in the mid sigmoid segment and 2 in the ascending segment. remainder of the colonic mucosa appeared normal. bx= tubular adenoma   COLONOSCOPY N/A 07/17/2015   Procedure: COLONOSCOPY;  Surgeon: Daneil Dolin, MD;  [facility];  Service: Endoscopy;  Laterality: N/A;  730    EYE SURGERY     cataract with lens implants   HAND SURGERY Right 1989, 1991   KNEE SURGERY Right 2000   NEPHRECTOMY Right 2000   pancreatic duct stent     mult times   SHOULDER SURGERY Left 09/16/2011   SPINAL FUSION      testicular tumor  1989   TOTAL KNEE ARTHROPLASTY Right 05/24/2016   Procedure: RIGHT TOTAL KNEE ARTHROPLASTY;  Surgeon: Mcarthur Rossetti, MD;  Location: [facility];  Service: Orthopedics;  Laterality: Right;   FAMILY HISTORY Family History  Adopted: Yes  Problem Relation Age of Onset   Cancer Mother    SOCIAL HISTORY Social History   Tobacco Use   Smoking status: Former    Packs/day: 1.00    Years: 3.00    Pack years: 3.00    Types: Cigarettes    Quit date: 05/17/1956    Years since quitting: 65.8   Smokeless tobacco: Never   Tobacco comments:    quit in [place] Use   Vaping Use: Never used  Substance Use Topics   Alcohol use: No    Comment: 08/11/19 "too much for 6 yrs", quit in 1958   Drug use: No       OPHTHALMIC EXAM:  Base Eye Exam     Visual Acuity (Snellen - Linear)       Right Left   Dist cc 20/20 20/20    Correction: Glasses         Tonometry (Tonopen, 9:25 AM)       Right Left   Pressure 18 20         Pupils       Dark Light Shape React APD   Right 1 1 Round Minimal None   Left 1 1 Round Minimal None         Visual Fields       Left Right    Full Full         Extraocular Movement       Right Left    Full, Ortho Full, Ortho         Neuro/Psych     Oriented x3: Yes   Mood/Affect: Normal         Dilation     Both eyes: 1.0% Mydriacyl @ 9:24 AM           Slit Lamp and Fundus Exam     Slit Lamp Exam       Right Left   Lids/Lashes Dermatochalasis - upper lid Dermatochalasis - upper lid, Meibomian gland dysfunction   Conjunctiva/Sclera White and quiet White and quiet   Cornea arcus, well healed cataract wound, nasal LRI, tear film debris, trace PEE Mild arcus, well healed cataract wound, nasal LRI, trace PEE, fine endopigment   Anterior Chamber Deep and quiet Deep and quiet   Iris round and poorly dilated to 3.75, Transillumination defects from 0130-0330 round and poorly dilated to 3.5, faint peripheral  TID from 0245-0400   Lens PC IOL in good position with open PC PC IOL in good position   Anterior Vitreous Vitreous syneresis Vitreous syneresis, Posterior vitreous detachment, vitreous condensations         Fundus Exam       Right Left   Disc Pink and Sharp mild Pallor, Sharp rim   C/D Ratio 0.1 0.4   Macula Flat, Blunted foveal reflex, Drusen, RPE mottling, no heme or edema Flat, Blunted foveal reflex, Drusen, RPE mottling and clumping, No heme or edema   Vessels attenuated, mild Copper wiring attenuated, mild AV crossing changes   Periphery Attached, reticular degeneration, No RT/RD Attached, retinal tear with pigmented laser scars surrounding at 0300, reticular degeneration, no new RT/RD           Refraction     Wearing Rx       Sphere Cylinder Axis Add   Right -1.25 +1.75 168 +2.50   Left -0.75 +0.75 031 +2.50           IMAGING AND PROCEDURES  Imaging and Procedures for 03/04/2022  OCT, Retina - OU - Both Eyes       Right Eye Quality was good. Central Foveal Thickness: 249. Progression has been stable. Findings include normal foveal contour, no IRF, no SRF, retinal drusen .   Left Eye Quality was good. Central Foveal Thickness: 253. Progression has been stable. Findings include normal foveal contour, no IRF, no SRF, retinal drusen (Focal prominent druse IN to fovea--stable).   Notes *Images captured and stored on drive  Diagnosis / Impression:  NFP, no IRF/SRF OU OS: Focal prominent druse IN to fovea--stable  Clinical management:  See below  Abbreviations: NFP - Normal foveal profile. CME - cystoid macular edema. PED - pigment epithelial detachment. IRF - intraretinal fluid. SRF - subretinal fluid. EZ - ellipsoid zone. ERM - epiretinal membrane. ORA - outer retinal atrophy. ORT - outer retinal tubulation. SRHM - subretinal hyper-reflective material. IRHM - intraretinal hyper-reflective material      Fluorescein Angiography Optos (Transit OD)        Right Eye Progression has no prior data. Early phase findings include normal observations. Mid/Late phase findings include normal observations, staining (No CNV).   Left Eye Progression has no prior data. Early phase findings include staining, window defect. Mid/Late phase findings include staining, window defect (No CNV, no leakage).   Notes Images stored on drive;   Impression: Non-exudative ARMD OU No CNV, no leakage OU           ASSESSMENT/PLAN:    ICD-10-CM   1. Intermediate stage nonexudative age-related macular degeneration of both eyes  H35.3132 OCT, Retina - OU - Both Eyes    Fluorescein Angiography Optos (Transit OD)    2. History of repair of retinal tear by laser photocoagulation  Z98.890     3. Essential hypertension  I10     4. Hypertensive retinopathy of both eyes  H35.033     5. Pseudophakia, both eyes  Z96.1      Age related macular degeneration, non-exudative, both eyes  - intermediate stage w/ mild drusen OU  - BCVA 20/20 OU  - OS w/ prominent focal druse IN to fovea -- stable on OCT  - FA 6.5.23 w/o leakage or CNV OU  - The incidence, anatomy, and pathology of dry AMD, risk of progression, and the AREDS and AREDS 2 study including smoking risks discussed with patient.  - Recommend amsler grid monitoring  - f/u  6-9 months, DFE, OCT  2. History of retinal tear s/p laser retinopexy OS  - retinal tear at 0300 with good laser in place (JDM)  3,4. Hypertensive retinopathy OU - discussed importance of tight BP control - monitor  5. Pseudophakia OU  - s/p CE/IOL (Dr. Dolores Lory) - doing well  - monitor  Ophthalmic Meds Ordered this visit:  No orders of the defined types were placed in this encounter.    Return 6-9 months, for DFE, OCT.  There are no Patient Instructions on file for this visit.  Explained the diagnoses, plan, and follow up with the patient and they expressed understanding.  Patient expressed understanding of the importance of  proper follow up care. .  This document serves as a record of services personally performed by Gardiner Sleeper, MD, PhD. It was created on their behalf by Roselee Nova, COMT. The creation of this record is the provider's dictation and/or activities during the visit.  Electronically signed by: Roselee Nova, COMT 03/04/22 1:42 PM  Gardiner Sleeper, M.D., Ph.D. Diseases & Surgery of the Retina and Vitreous [facility]  I have reviewed the above documentation for accuracy and completeness, and I agree with the above. Gardiner Sleeper, M.D., Ph.D. 03/04/22 1:42 PM  Abbreviations: M myopia (nearsighted); A astigmatism; H hyperopia (farsighted); P presbyopia; Mrx spectacle prescription;  CTL contact lenses; OD right eye; OS left eye; OU both eyes  XT exotropia; ET esotropia; PEK punctate epithelial keratitis; PEE punctate epithelial erosions; DES dry eye syndrome; MGD meibomian gland dysfunction; ATs artificial tears; PFAT's preservative free artificial tears; [place] nuclear sclerotic cataract; PSC posterior subcapsular cataract; ERM epi-retinal membrane; PVD posterior vitreous detachment; RD retinal detachment; DM diabetes mellitus; DR diabetic retinopathy; NPDR non-proliferative diabetic retinopathy; PDR proliferative diabetic retinopathy; CSME clinically significant macular edema; DME diabetic macular edema; dbh dot blot hemorrhages; CWS cotton wool spot; POAG primary open angle glaucoma; C/D cup-to-disc ratio; HVF humphrey visual field; GVF goldmann visual field; OCT optical coherence tomography; IOP intraocular pressure; BRVO Branch retinal vein occlusion; CRVO central retinal vein occlusion; CRAO central retinal artery occlusion; BRAO branch retinal artery occlusion; RT retinal tear; SB scleral buckle; PPV pars plana vitrectomy; VH Vitreous hemorrhage; PRP panretinal laser photocoagulation; IVK intravitreal kenalog; VMT vitreomacular traction; MH Macular hole;  NVD neovascularization of  the disc; NVE neovascularization elsewhere; AREDS age related eye disease study; ARMD age related macular degeneration; POAG primary open angle glaucoma; EBMD epithelial/anterior basement membrane dystrophy; ACIOL anterior chamber intraocular lens; IOL intraocular lens; PCIOL posterior chamber intraocular lens; Phaco/IOL phacoemulsification with intraocular lens placement; [place] photorefractive keratectomy; LASIK laser assisted in situ keratomileusis; HTN hypertension; DM diabetes mellitus; COPD chronic obstructive pulmonary disease

## 2022-03-04 ENCOUNTER — Ambulatory Visit (INDEPENDENT_AMBULATORY_CARE_PROVIDER_SITE_OTHER): Payer: Medicare Other | Admitting: Ophthalmology

## 2022-03-04 ENCOUNTER — Encounter (INDEPENDENT_AMBULATORY_CARE_PROVIDER_SITE_OTHER): Payer: Self-pay | Admitting: Ophthalmology

## 2022-03-04 DIAGNOSIS — I10 Essential (primary) hypertension: Secondary | ICD-10-CM

## 2022-03-04 DIAGNOSIS — Z9889 Other specified postprocedural states: Secondary | ICD-10-CM

## 2022-03-04 DIAGNOSIS — Z961 Presence of intraocular lens: Secondary | ICD-10-CM | POA: Diagnosis not present

## 2022-03-04 DIAGNOSIS — H35033 Hypertensive retinopathy, bilateral: Secondary | ICD-10-CM

## 2022-03-04 DIAGNOSIS — H353132 Nonexudative age-related macular degeneration, bilateral, intermediate dry stage: Secondary | ICD-10-CM

## 2022-03-14 ENCOUNTER — Encounter: Payer: Self-pay | Admitting: Physical Medicine and Rehabilitation

## 2022-03-14 ENCOUNTER — Ambulatory Visit: Payer: Medicare Other

## 2022-03-14 ENCOUNTER — Ambulatory Visit (INDEPENDENT_AMBULATORY_CARE_PROVIDER_SITE_OTHER): Payer: Medicare Other | Admitting: Physical Medicine and Rehabilitation

## 2022-03-14 VITALS — BP 135/63 | HR 74

## 2022-03-14 DIAGNOSIS — M5416 Radiculopathy, lumbar region: Secondary | ICD-10-CM | POA: Diagnosis not present

## 2022-03-14 MED ORDER — METHYLPREDNISOLONE ACETATE 80 MG/ML IJ SUSP
80.0000 mg | Freq: Once | INTRAMUSCULAR | Status: AC
  Administered 2022-03-14: 80 mg

## 2022-03-14 NOTE — Progress Notes (Unsigned)
Pt state lower back pain that travels down to his right leg and knee. Pt state getting out of bed make the pain worse. Pt state he take pain meds to help ease the pain  Numeric Pain Rating Scale and Functional Assessment Average Pain 4   In the last MONTH (on 0-10 scale) has pain interfered with the following?  1. General activity like being  able to carry out your everyday physical activities such as walking, climbing stairs, carrying groceries, or moving a chair?  Rating(9)   +Driver, -BT, -Dye Allergies.

## 2022-03-14 NOTE — Patient Instructions (Signed)

## 2022-03-22 DIAGNOSIS — R5383 Other fatigue: Secondary | ICD-10-CM | POA: Diagnosis not present

## 2022-03-22 DIAGNOSIS — E7849 Other hyperlipidemia: Secondary | ICD-10-CM | POA: Diagnosis not present

## 2022-03-22 DIAGNOSIS — N183 Chronic kidney disease, stage 3 unspecified: Secondary | ICD-10-CM | POA: Diagnosis not present

## 2022-03-22 DIAGNOSIS — E7800 Pure hypercholesterolemia, unspecified: Secondary | ICD-10-CM | POA: Diagnosis not present

## 2022-03-22 DIAGNOSIS — K219 Gastro-esophageal reflux disease without esophagitis: Secondary | ICD-10-CM | POA: Diagnosis not present

## 2022-03-22 DIAGNOSIS — A084 Viral intestinal infection, unspecified: Secondary | ICD-10-CM | POA: Diagnosis not present

## 2022-03-22 DIAGNOSIS — E782 Mixed hyperlipidemia: Secondary | ICD-10-CM | POA: Diagnosis not present

## 2022-03-22 DIAGNOSIS — D529 Folate deficiency anemia, unspecified: Secondary | ICD-10-CM | POA: Diagnosis not present

## 2022-03-22 DIAGNOSIS — E7801 Familial hypercholesterolemia: Secondary | ICD-10-CM | POA: Diagnosis not present

## 2022-03-27 DIAGNOSIS — R5383 Other fatigue: Secondary | ICD-10-CM | POA: Diagnosis not present

## 2022-03-27 DIAGNOSIS — N189 Chronic kidney disease, unspecified: Secondary | ICD-10-CM | POA: Diagnosis not present

## 2022-03-27 DIAGNOSIS — E7849 Other hyperlipidemia: Secondary | ICD-10-CM | POA: Diagnosis not present

## 2022-03-27 DIAGNOSIS — N401 Enlarged prostate with lower urinary tract symptoms: Secondary | ICD-10-CM | POA: Diagnosis not present

## 2022-03-27 DIAGNOSIS — R4189 Other symptoms and signs involving cognitive functions and awareness: Secondary | ICD-10-CM | POA: Diagnosis not present

## 2022-03-27 DIAGNOSIS — H8190 Unspecified disorder of vestibular function, unspecified ear: Secondary | ICD-10-CM | POA: Diagnosis not present

## 2022-03-27 DIAGNOSIS — N138 Other obstructive and reflux uropathy: Secondary | ICD-10-CM | POA: Diagnosis not present

## 2022-03-27 DIAGNOSIS — K859 Acute pancreatitis without necrosis or infection, unspecified: Secondary | ICD-10-CM | POA: Diagnosis not present

## 2022-03-27 DIAGNOSIS — Z6823 Body mass index (BMI) 23.0-23.9, adult: Secondary | ICD-10-CM | POA: Diagnosis not present

## 2022-03-27 DIAGNOSIS — R42 Dizziness and giddiness: Secondary | ICD-10-CM | POA: Diagnosis not present

## 2022-03-27 DIAGNOSIS — R7301 Impaired fasting glucose: Secondary | ICD-10-CM | POA: Diagnosis not present

## 2022-05-09 DIAGNOSIS — L110 Acquired keratosis follicularis: Secondary | ICD-10-CM | POA: Diagnosis not present

## 2022-05-09 DIAGNOSIS — M79675 Pain in left toe(s): Secondary | ICD-10-CM | POA: Diagnosis not present

## 2022-05-09 DIAGNOSIS — I739 Peripheral vascular disease, unspecified: Secondary | ICD-10-CM | POA: Diagnosis not present

## 2022-05-09 DIAGNOSIS — M79672 Pain in left foot: Secondary | ICD-10-CM | POA: Diagnosis not present

## 2022-05-09 DIAGNOSIS — M79671 Pain in right foot: Secondary | ICD-10-CM | POA: Diagnosis not present

## 2022-05-09 DIAGNOSIS — M79674 Pain in right toe(s): Secondary | ICD-10-CM | POA: Diagnosis not present

## 2022-05-18 ENCOUNTER — Ambulatory Visit
Admission: EM | Admit: 2022-05-18 | Discharge: 2022-05-18 | Disposition: A | Discharge status: Home / Self Care | Payer: Medicare Other | Attending: Nurse Practitioner | Admitting: Nurse Practitioner

## 2022-05-18 DIAGNOSIS — Z20822 Contact with and (suspected) exposure to covid-19: Secondary | ICD-10-CM | POA: Diagnosis not present

## 2022-05-18 DIAGNOSIS — R0989 Other specified symptoms and signs involving the circulatory and respiratory systems: Secondary | ICD-10-CM | POA: Insufficient documentation

## 2022-05-18 MED ORDER — ALBUTEROL SULFATE HFA 108 (90 BASE) MCG/ACT IN AERS: 2.0000 | INHALATION_SPRAY | Freq: Four times a day (QID) | RESPIRATORY_TRACT | 0 refills | Status: DC | PRN

## 2022-05-18 NOTE — ED Provider Notes (Signed)
[facility]    CSN: [id number] Arrival date & time: 05/18/22  1253      History   Chief Complaint Chief Complaint  Patient presents with   Cough   Headache   Fatigue         HPI Joshua Strong is a 88 y.o. male.   The history is provided by the patient.   Patient presents for complaints of cough, headache, shortness of breath, fatigue, and generalized body aches for the past 24 hours.  Patient denies fever, chills, cough, wheezing, abdominal pain, nausea, vomiting, or diarrhea.  Patient states that he was recently informed that 3 of the church members that he has been around was recently diagnosed with COVID.  Patient reports that he has received 5 COVID vaccines to date.  He is requesting a COVID test today.  He has not taken any medication for symptoms.  Past Medical History:  Diagnosis Date   Arthritis    Dizziness    GERD (gastroesophageal reflux disease)    GI bleed    ulcer   Hemorrhoid    Memory loss    Pancreatitis    Pneumonia    Renal cell carcinoma ([place])    Syncope    Tremor    Tubular adenoma 2013    Patient Active Problem List   Diagnosis Date Noted   Pain in left wrist 03/28/2021   Cerebellar ataxia ([place]) 11/15/2020   Balance disorder 11/15/2020   Spinal stenosis of lumbar region with neurogenic claudication 09/10/2019   Lumbar radiculopathy 09/10/2019   Presence of right artificial knee joint 05/20/2017   Unilateral primary osteoarthritis, left knee 05/20/2017   History of total right knee replacement 02/17/2017   Chronic pain of left knee 02/17/2017   Osteoarthritis of right knee 05/24/2016   Status post total right knee replacement 05/24/2016   Diverticulosis of colon without hemorrhage    CONSTIPATION 04/19/2010   ABDOMINAL PAIN, LEFT LOWER QUADRANT 09/18/2009   EPIGASTRIC PAIN 09/18/2009   CARCINOMA, RENAL CELL 08/08/2008   PNEUMONIA 08/08/2008   ACUTE PANCREATITIS 08/08/2008   NAUSEA 08/08/2008   ABDOMINAL PAIN, CHRONIC  08/08/2008    Past Surgical History:  Procedure Laterality Date   [place]   COLONOSCOPY  06/16/2007   Dr. Gala Romney- minimal internal hemorrhoids o/w normal rectum,tubular adenoma   COLONOSCOPY  07/02/2012   Dr.Rourk- normal rectum, 3 5-7 mm peduncuated polyos, one in the mid sigmoid segment and 2 in the ascending segment. remainder of the colonic mucosa appeared normal. bx= tubular adenoma   COLONOSCOPY N/A 07/17/2015   Procedure: COLONOSCOPY;  Surgeon: Daneil Dolin, MD;  [facility];  Service: Endoscopy;  Laterality: N/A;  730    EYE SURGERY     cataract with lens implants   HAND SURGERY Right 1989, 1991   KNEE SURGERY Right 2000   NEPHRECTOMY Right 2000   pancreatic duct stent     mult times   SHOULDER SURGERY Left 09/16/2011   SPINAL FUSION     testicular tumor  1989   TOTAL KNEE ARTHROPLASTY Right 05/24/2016   Procedure: RIGHT TOTAL KNEE ARTHROPLASTY;  Surgeon: Mcarthur Rossetti, MD;  Location: [facility];  Service: Orthopedics;  Laterality: Right;       Home Medications    Prior to Admission medications   Medication Sig Start Date End Date Taking? Authorizing Provider  albuterol (VENTOLIN HFA)  108 (90 Base) MCG/ACT inhaler Inhale 2 puffs into the lungs every 6 (six) hours as needed for wheezing or shortness of breath. 05/18/22  Yes [place] Navarrette-Warren, Alda Lea, NP  acetaminophen (TYLENOL) 500 MG tablet Take 500 mg by mouth every 6 (six) hours as needed for mild pain.    [provider]  Apoaequorin (PREVAGEN PO) Take by mouth daily.    [provider]  aspirin 81 MG chewable tablet Chew 81 mg by mouth daily.    [provider]  cyclobenzaprine (FLEXERIL) 10 MG tablet Take 10 mg by mouth 3 (three) times daily as needed for muscle spasms.    [provider]  diazepam (VALIUM) 5 MG tablet Take one tablet by mouth with food one hour prior to procedure. May repeat  30 minutes prior if needed. 02/18/22   Lorine Bears, NP  Multiple Vitamin (MULTIVITAMIN) tablet Take 1 tablet by mouth daily.    [provider]  OVER THE COUNTER MEDICATION Super Beta Prostate twice daily    [provider]  pantoprazole (PROTONIX) 20 MG tablet Take 1 tablet (20 mg total) by mouth daily. 12/22/19   Carlis Stable, NP  polyethylene glycol (MIRALAX / GLYCOLAX) 17 g packet Take 17 g by mouth daily.    [provider]  PRESCRIPTION MEDICATION Injection every 6 months for osteoporosis (receives at [facility])    [provider]    Family History Family History  Adopted: Yes  Problem Relation Age of Onset   Cancer Mother     Social History Social History   Tobacco Use   Smoking status: Former    Packs/day: 1.00    Years: 3.00    Total pack years: 3.00    Types: Cigarettes    Quit date: 05/17/1956    Years since quitting: 66.0   Smokeless tobacco: Never   Tobacco comments:    quit in [place] Use   Vaping Use: Never used  Substance Use Topics   Alcohol use: No    Comment: 08/11/19 "too much for 6 yrs", quit in 1958   Drug use: No     Allergies   Morphine and related and Metoclopramide hcl   Review of Systems Review of Systems Per HPI  Physical Exam Triage Vital Signs ED Triage Vitals  Enc Vitals Group     BP 05/18/22 1332 127/60     Pulse Rate 05/18/22 1332 67     Resp 05/18/22 1332 18     Temp 05/18/22 1332 98.2 F (36.8 C)     Temp Source 05/18/22 1332 Oral     SpO2 05/18/22 1332 98 %     Weight --      Height --      Head Circumference --      Peak Flow --      Pain Score 05/18/22 1333 6     Pain Loc --      Pain Edu? --      Excl. in [place]? --    No data found.  Updated Vital Signs BP 127/60 (BP Location: Right Arm)   Pulse 67   Temp 98.2 F (36.8 C) (Oral)   Resp 18   SpO2 98%   Visual Acuity Right Eye Distance:   Left Eye Distance:   Bilateral Distance:    Right Eye Near:   Left Eye  Near:    Bilateral Near:     Physical Exam Vitals and nursing note reviewed.  Constitutional:  General: He is not in acute distress.    Appearance: He is well-developed.  HENT:     Head: Normocephalic.     Right Ear: Tympanic membrane, ear canal and external ear normal.     Left Ear: Tympanic membrane, ear canal and external ear normal.     Nose: Rhinorrhea present.     Mouth/Throat:     Mouth: Mucous membranes are moist.  Eyes:     Extraocular Movements: Extraocular movements intact.     Pupils: Pupils are equal, round, and reactive to light.  Cardiovascular:     Rate and Rhythm: Regular rhythm.     Pulses: Normal pulses.     Heart sounds: Normal heart sounds.  Pulmonary:     Effort: Pulmonary effort is normal. No respiratory distress.     Breath sounds: Normal breath sounds. No stridor. No wheezing, rhonchi or rales.  Abdominal:     General: Bowel sounds are normal.     Palpations: Abdomen is soft.     Tenderness: There is no abdominal tenderness.  Musculoskeletal:     Cervical back: Normal range of motion.  Lymphadenopathy:     Cervical: No cervical adenopathy.  Skin:    General: Skin is warm and dry.     Capillary Refill: Capillary refill takes less than 2 seconds.  Neurological:     General: No focal deficit present.     Mental Status: He is alert and oriented to person, place, and time.  Psychiatric:        Mood and Affect: Mood normal.        Behavior: Behavior normal.      UC Treatments / Results  Labs (all labs ordered are listed, but only abnormal results are displayed) Labs Reviewed  SARS CORONAVIRUS 2 (TAT 6-24 HRS)    EKG   Radiology No results found.  Procedures Procedures (including critical care time)  Medications Ordered in UC Medications - No data to display  Initial Impression / Assessment and Plan / UC Course  I have reviewed the triage vital signs and the nursing notes.  Pertinent labs & imaging results that were available  during my care of the patient were reviewed by me and considered in my medical decision making (see chart for details).  Presents for upper respiratory symptoms that been present for the past day.  Patient complains of headache, shortness of breath, fatigue, and generalized body aches.  Patient's vital signs are stable, exam is otherwise benign.  COVID test is pending.  Discussed antiviral options with the patient.  After review of the patient's chart he does not have any recent creatinine results.  Patient will be a candidate for molnupiravir, patient is in agreement with this plan.  Supportive care recommendations were provided to the patient.  Will prescribe patient an albuterol inhaler for his shortness of breath.  Patient advised to continue Tylenol as needed.  Patient advised to follow-up with his primary care physician to advise if his COVID test is positive..  Patient was given strict indications of when to go to the emergency department. Final Clinical Impressions(s) / UC Diagnoses   Final diagnoses:  Exposure to COVID-19 virus  Symptoms of upper respiratory infection (URI)     Discharge Instructions      Your COVID test is pending.  If the results are positive, you will be contacted.  As discussed, you will be a candidate for molnupiravir as the antiviral use for treatment. May continue to take Tylenol for pain  or fever. Increase fluids and allow for plenty of rest. Recommend sleeping elevated on 2 pillows while cough symptoms persist. May also use a humidifier in the bedroom during sleep during sleep to help with cough symptoms. Go to the emergency department immediately if you develop worsening shortness of breath, difficulty breathing, trouble breathing, or other concerns. Remain isolated until you receive your COVID results.     ED Prescriptions     Medication Sig Dispense Auth. Provider   albuterol (VENTOLIN HFA) 108 (90 Base) MCG/ACT inhaler Inhale 2 puffs into the lungs  every 6 (six) hours as needed for wheezing or shortness of breath. 8 g Houda Brau-Warren, Alda Lea, NP      PDMP not reviewed this encounter.   Tish Men, NP 05/18/22 1422

## 2022-05-18 NOTE — ED Triage Notes (Signed)
Pt reports cough, headache, shortness of breath,fatigue and body aches x 1 day. Pt requested COVID test.

## 2022-05-18 NOTE — Discharge Instructions (Addendum)
Your COVID test is pending.  If the results are positive, you will be contacted.  As discussed, you will be a candidate for molnupiravir as the antiviral use for treatment. May continue to take Tylenol for pain or fever. Increase fluids and allow for plenty of rest. Recommend sleeping elevated on 2 pillows while cough symptoms persist. May also use a humidifier in the bedroom during sleep during sleep to help with cough symptoms. Go to the emergency department immediately if you develop worsening shortness of breath, difficulty breathing, trouble breathing, or other concerns. Remain isolated until you receive your COVID results.

## 2022-05-19 LAB — SARS CORONAVIRUS 2 (TAT 6-24 HRS): SARS Coronavirus 2: NEGATIVE

## 2022-05-25 ENCOUNTER — Emergency Department (HOSPITAL_COMMUNITY)
Admission: EM | Admit: 2022-05-25 | Discharge: 2022-05-25 | Disposition: A | Discharge status: Home / Self Care | Payer: Medicare Other | Attending: Emergency Medicine | Admitting: Emergency Medicine

## 2022-05-25 ENCOUNTER — Encounter (HOSPITAL_COMMUNITY): Payer: Self-pay | Admitting: Emergency Medicine

## 2022-05-25 ENCOUNTER — Other Ambulatory Visit: Payer: Self-pay

## 2022-05-25 ENCOUNTER — Emergency Department (HOSPITAL_COMMUNITY): Payer: Medicare Other

## 2022-05-25 DIAGNOSIS — M25561 Pain in right knee: Secondary | ICD-10-CM | POA: Diagnosis not present

## 2022-05-25 DIAGNOSIS — S51012A Laceration without foreign body of left elbow, initial encounter: Secondary | ICD-10-CM | POA: Insufficient documentation

## 2022-05-25 DIAGNOSIS — Z471 Aftercare following joint replacement surgery: Secondary | ICD-10-CM | POA: Diagnosis not present

## 2022-05-25 DIAGNOSIS — Y9389 Activity, other specified: Secondary | ICD-10-CM | POA: Insufficient documentation

## 2022-05-25 DIAGNOSIS — W1830XA Fall on same level, unspecified, initial encounter: Secondary | ICD-10-CM | POA: Insufficient documentation

## 2022-05-25 DIAGNOSIS — Z96651 Presence of right artificial knee joint: Secondary | ICD-10-CM | POA: Diagnosis not present

## 2022-05-25 DIAGNOSIS — M25522 Pain in left elbow: Secondary | ICD-10-CM | POA: Diagnosis not present

## 2022-05-25 DIAGNOSIS — Z043 Encounter for examination and observation following other accident: Secondary | ICD-10-CM | POA: Diagnosis not present

## 2022-05-25 DIAGNOSIS — W19XXXA Unspecified fall, initial encounter: Secondary | ICD-10-CM

## 2022-05-25 DIAGNOSIS — G9389 Other specified disorders of brain: Secondary | ICD-10-CM | POA: Diagnosis not present

## 2022-05-25 DIAGNOSIS — Y92094 Garage of other non-institutional residence as the place of occurrence of the external cause: Secondary | ICD-10-CM | POA: Diagnosis not present

## 2022-05-25 DIAGNOSIS — R55 Syncope and collapse: Secondary | ICD-10-CM | POA: Diagnosis not present

## 2022-05-25 DIAGNOSIS — N179 Acute kidney failure, unspecified: Secondary | ICD-10-CM | POA: Diagnosis not present

## 2022-05-25 DIAGNOSIS — S61512A Laceration without foreign body of left wrist, initial encounter: Secondary | ICD-10-CM | POA: Diagnosis not present

## 2022-05-25 DIAGNOSIS — Z7982 Long term (current) use of aspirin: Secondary | ICD-10-CM | POA: Diagnosis not present

## 2022-05-25 DIAGNOSIS — R079 Chest pain, unspecified: Secondary | ICD-10-CM | POA: Diagnosis not present

## 2022-05-25 DIAGNOSIS — I6782 Cerebral ischemia: Secondary | ICD-10-CM | POA: Diagnosis not present

## 2022-05-25 DIAGNOSIS — M25511 Pain in right shoulder: Secondary | ICD-10-CM | POA: Diagnosis not present

## 2022-05-25 DIAGNOSIS — M21932 Unspecified acquired deformity of left forearm: Secondary | ICD-10-CM | POA: Diagnosis not present

## 2022-05-25 DIAGNOSIS — R102 Pelvic and perineal pain: Secondary | ICD-10-CM | POA: Diagnosis not present

## 2022-05-25 LAB — CBC WITH DIFFERENTIAL/PLATELET
Abs Immature Granulocytes: 0.02 10*3/uL (ref 0.00–0.07)
Basophils Absolute: 0.1 10*3/uL (ref 0.0–0.1)
Basophils Relative: 1 %
Eosinophils Absolute: 0.2 10*3/uL (ref 0.0–0.5)
Eosinophils Relative: 3 %
HCT: 44.8 % (ref 39.0–52.0)
Hemoglobin: 14.9 g/dL (ref 13.0–17.0)
Immature Granulocytes: 0 %
Lymphocytes Relative: 25 %
Lymphs Abs: 1.8 10*3/uL (ref 0.7–4.0)
MCH: 30.3 pg (ref 26.0–34.0)
MCHC: 33.3 g/dL (ref 30.0–36.0)
MCV: 91.1 fL (ref 80.0–100.0)
Monocytes Absolute: 0.7 10*3/uL (ref 0.1–1.0)
Monocytes Relative: 10 %
Neutro Abs: 4.4 10*3/uL (ref 1.7–7.7)
Neutrophils Relative %: 61 %
Platelets: 213 10*3/uL (ref 150–400)
RBC: 4.92 MIL/uL (ref 4.22–5.81)
RDW: 13.5 % (ref 11.5–15.5)
WBC: 7.3 10*3/uL (ref 4.0–10.5)
nRBC: 0 % (ref 0.0–0.2)

## 2022-05-25 LAB — BASIC METABOLIC PANEL
Anion gap: 9 (ref 5–15)
BUN: 28 mg/dL — ABNORMAL HIGH (ref 8–23)
CO2: 23 mmol/L (ref 22–32)
Calcium: 10.1 mg/dL (ref 8.9–10.3)
Chloride: 107 mmol/L (ref 98–111)
Creatinine, Ser: 1.53 mg/dL — ABNORMAL HIGH (ref 0.61–1.24)
GFR, Estimated: 43 mL/min — ABNORMAL LOW (ref >60)
Glucose, Bld: 87 mg/dL (ref 70–99)
Potassium: 4.7 mmol/L (ref 3.5–5.1)
Sodium: 139 mmol/L (ref 135–145)

## 2022-05-25 LAB — TROPONIN I (HIGH SENSITIVITY)
Troponin I (High Sensitivity): 12 ng/L (ref <18)
Troponin I (High Sensitivity): 12 ng/L (ref <18)

## 2022-05-25 NOTE — ED Provider Notes (Signed)
[facility] Provider Note   CSN: [id number] Arrival date & time: 05/25/22  1427     History  Chief Complaint  Patient presents with   Joshua Strong is a 88 y.o. male.   Fall   Patient with medical history of osteoarthritis, spinal stenosis, cerebral ataxia, previous syncopal episode presents today due to fall/syncope.  Patient was unloading boxes in the garage, he felt lightheaded, and lost consciousness and fell..  Does not think he hit his head, he has pain in his left wrist, elbow, right knee, neck.  Was unconscious for less than a minutes.  Denies chest pain or shortness of breath.  He is not have any lateralized weakness or numbness.  No history of seizures.  Denies any history of ACS, PE, previous TIA stroke.  No recent changes in medication.    Home Medications Prior to Admission medications   Medication Sig Start Date End Date Taking? Authorizing Provider  acetaminophen (TYLENOL) 500 MG tablet Take 500 mg by mouth every 6 (six) hours as needed for mild pain.    [provider]  albuterol (VENTOLIN HFA) 108 (90 Base) MCG/ACT inhaler Inhale 2 puffs into the lungs every 6 (six) hours as needed for wheezing or shortness of breath. 05/18/22   Leath-Warren, Alda Lea, NP  Apoaequorin (PREVAGEN PO) Take by mouth daily.    [provider]  aspirin 81 MG chewable tablet Chew 81 mg by mouth daily.    [provider]  cyclobenzaprine (FLEXERIL) 10 MG tablet Take 10 mg by mouth 3 (three) times daily as needed for muscle spasms.    [provider]  diazepam (VALIUM) 5 MG tablet Take one tablet by mouth with food one hour prior to procedure. May repeat 30 minutes prior if needed. 02/18/22   Lorine Bears, NP  Multiple Vitamin (MULTIVITAMIN) tablet Take 1 tablet by mouth daily.    [provider]  OVER THE COUNTER MEDICATION Super Beta Prostate twice daily    [provider]  pantoprazole (PROTONIX) 20 MG  tablet Take 1 tablet (20 mg total) by mouth daily. 12/22/19   Carlis Stable, NP  polyethylene glycol (MIRALAX / GLYCOLAX) 17 g packet Take 17 g by mouth daily.    [provider]  PRESCRIPTION MEDICATION Injection every 6 months for osteoporosis (receives at [facility])    [provider]      Allergies    Morphine and related and Metoclopramide hcl    Review of Systems   Review of Systems  Physical Exam Updated Vital Signs BP (!) 162/67 (BP Location: Right Arm)   Pulse 62   Temp 98.2 F (36.8 C) (Oral)   Resp (!) 22   Ht '5\' 6"'$  (1.676 m)   Wt 71.7 kg   SpO2 100%   BMI 25.51 kg/m  Physical Exam Vitals and nursing note reviewed. Exam conducted with a chaperone present.  Constitutional:      Appearance: Normal appearance.  HENT:     Head: Normocephalic and atraumatic.  Eyes:     General: No scleral icterus.       Right eye: No discharge.        Left eye: No discharge.     Extraocular Movements: Extraocular movements intact.     Pupils: Pupils are equal, round, and reactive to light.  Cardiovascular:     Rate and Rhythm: Normal rate and regular rhythm.     Pulses: Normal pulses.  Heart sounds: Normal heart sounds. No murmur heard.    No friction rub. No gallop.  Pulmonary:     Effort: Pulmonary effort is normal. No respiratory distress.     Breath sounds: Normal breath sounds.  Abdominal:     General: Abdomen is flat. Bowel sounds are normal. There is no distension.     Palpations: Abdomen is soft.     Tenderness: There is no abdominal tenderness.  Musculoskeletal:        General: Tenderness present.     Cervical back: Normal range of motion. No tenderness.     Comments: Tender to left wrist, left elbow.  Skin tears to the left elbow and wrist.  No contusions to the chest wall, moving extremities without any difficulty.  Some tenderness to the right knee without any crepitus.  Skin:    General: Skin is warm and dry.     Coloration: Skin is not  jaundiced.  Neurological:     Mental Status: He is alert. Mental status is at baseline.     Coordination: Coordination normal.     Comments: Cranial nerves II through XII are grossly intact.  Grip strength equal bilaterally, lower extremity strength equal bilaterally.     ED Results / Procedures / Treatments   Labs (all labs ordered are listed, but only abnormal results are displayed) Labs Reviewed  BASIC METABOLIC PANEL - Abnormal; Notable for the following components:      Result Value   BUN 28 (*)    Creatinine, Ser 1.53 (*)    GFR, Estimated 43 (*)    All other components within normal limits  CBC WITH DIFFERENTIAL/PLATELET  TROPONIN I (HIGH SENSITIVITY)  TROPONIN I (HIGH SENSITIVITY)    EKG EKG Interpretation  Date/Time:  Saturday May 25 2022 14:51:05 EDT Ventricular Rate:  69 PR Interval:  226 QRS Duration: 148 QT Interval:  414 QTC Calculation: 443 R Axis:   106 Text Interpretation: Sinus rhythm with 1st degree A-V block Right bundle branch block T wave abnormality, consider inferior ischemia Abnormal ECG No previous ECGs available Confirmed by Octaviano Glow [id number]) on 05/25/2022 5:31:39 PM  Radiology CT Head Wo Contrast  Result Date: 05/25/2022 CLINICAL DATA:  [place]. EXAM: CT HEAD WITHOUT CONTRAST CT CERVICAL SPINE WITHOUT CONTRAST TECHNIQUE: Multidetector CT imaging of the head and cervical spine was performed following the standard protocol without intravenous contrast. Multiplanar CT image reconstructions of the cervical spine were also generated. RADIATION DOSE REDUCTION: This exam was performed according to the departmental dose-optimization program which includes automated exposure control, adjustment of the mA and/or kV according to patient size and/or use of iterative reconstruction technique. COMPARISON:  Head CT report dated 12/16/2018. FINDINGS: CT HEAD FINDINGS Brain: Mildly enlarged ventricles and cortical sulci. Minimal patchy white matter low density in  both cerebral hemispheres. No intracranial hemorrhage, mass lesion or CT evidence of acute infarction. Vascular: No hyperdense vessel or unexpected calcification. Skull: Normal. Negative for fracture or focal lesion. Sinuses/Orbits: Status post bilateral cataract extraction. Unremarkable bones and included paranasal sinuses. Other: None. CT CERVICAL SPINE FINDINGS Alignment: Minimal anterolisthesis at the C6-7 level. Skull base and vertebrae: No acute fracture. No primary bone lesion or focal pathologic process. Soft tissues and spinal canal: No prevertebral fluid or swelling. No visible canal hematoma. Disc levels: Mild multilevel degenerative changes. More pronounced facet degenerative changes throughout the cervical spine. Upper chest: Clear lung apices. Other: Mild bilateral carotid artery calcification. IMPRESSION: 1. No skull fracture or intracranial hemorrhage. 2. No cervical  spine fracture or traumatic subluxation. 3. Mild diffuse cerebral and cerebellar atrophy and minimal chronic small vessel white matter ischemic changes in both cerebral hemispheres. 4. Multilevel cervical spine degenerative changes, most pronounced involving the facets. 5. Mild bilateral carotid artery atheromatous calcification. Electronically Signed   By: Claudie Revering M.D.   On: 05/25/2022 19:10   CT Cervical Spine Wo Contrast  Result Date: 05/25/2022 CLINICAL DATA:  [place]. EXAM: CT HEAD WITHOUT CONTRAST CT CERVICAL SPINE WITHOUT CONTRAST TECHNIQUE: Multidetector CT imaging of the head and cervical spine was performed following the standard protocol without intravenous contrast. Multiplanar CT image reconstructions of the cervical spine were also generated. RADIATION DOSE REDUCTION: This exam was performed according to the departmental dose-optimization program which includes automated exposure control, adjustment of the mA and/or kV according to patient size and/or use of iterative reconstruction technique. COMPARISON:  Head CT  report dated 12/16/2018. FINDINGS: CT HEAD FINDINGS Brain: Mildly enlarged ventricles and cortical sulci. Minimal patchy white matter low density in both cerebral hemispheres. No intracranial hemorrhage, mass lesion or CT evidence of acute infarction. Vascular: No hyperdense vessel or unexpected calcification. Skull: Normal. Negative for fracture or focal lesion. Sinuses/Orbits: Status post bilateral cataract extraction. Unremarkable bones and included paranasal sinuses. Other: None. CT CERVICAL SPINE FINDINGS Alignment: Minimal anterolisthesis at the C6-7 level. Skull base and vertebrae: No acute fracture. No primary bone lesion or focal pathologic process. Soft tissues and spinal canal: No prevertebral fluid or swelling. No visible canal hematoma. Disc levels: Mild multilevel degenerative changes. More pronounced facet degenerative changes throughout the cervical spine. Upper chest: Clear lung apices. Other: Mild bilateral carotid artery calcification. IMPRESSION: 1. No skull fracture or intracranial hemorrhage. 2. No cervical spine fracture or traumatic subluxation. 3. Mild diffuse cerebral and cerebellar atrophy and minimal chronic small vessel white matter ischemic changes in both cerebral hemispheres. 4. Multilevel cervical spine degenerative changes, most pronounced involving the facets. 5. Mild bilateral carotid artery atheromatous calcification. Electronically Signed   By: Claudie Revering M.D.   On: 05/25/2022 19:10   DG Shoulder Right  Result Date: 05/25/2022 CLINICAL DATA:  Recent fall with right shoulder pain, initial encounter EXAM: RIGHT SHOULDER - 2+ VIEW COMPARISON:  None Available. FINDINGS: Mild degenerative changes of the acromioclavicular joint are seen. No fracture or dislocation is noted. Underlying bony thorax is unremarkable. No soft tissue changes are noted. IMPRESSION: Degenerative change without acute abnormality. Electronically Signed   By: Inez Catalina M.D.   On: 05/25/2022 19:06   DG  Pelvis 1-2 Views  Result Date: 05/25/2022 CLINICAL DATA:  Recent fall with pelvic pain, initial encounter EXAM: PELVIS - 1-VIEW COMPARISON:  None Available. FINDINGS: Pelvic ring is intact. Mild degenerative changes of the hip joints are noted bilaterally. No acute fracture or dislocation is seen. No soft tissue changes are noted. IMPRESSION: No acute abnormality noted. Electronically Signed   By: Inez Catalina M.D.   On: 05/25/2022 19:05   DG Chest 2 View  Result Date: 05/25/2022 CLINICAL DATA:  Recent fall with chest pain, initial encounter EXAM: CHEST - 2 VIEW COMPARISON:  10/12/2012 FINDINGS: Cardiac shadow is within normal limits. The lungs are well aerated bilaterally. Multiple calcified granulomas are noted particularly within the left lung. No focal infiltrate or effusion is seen. No definitive rib fractures are seen. No compression deformity is noted. IMPRESSION: No acute abnormality noted. Changes consistent with prior granulomatous disease. Electronically Signed   By: Inez Catalina M.D.   On: 05/25/2022 19:04   DG Wrist  Complete Left  Result Date: 05/25/2022 CLINICAL DATA:  Fall EXAM: LEFT WRIST - COMPLETE 3 VIEW COMPARISON:  None Available. FINDINGS: No evidence of acute fracture or dislocation. Osseous deformity of the distal radius. Advanced degenerative changes of the triscaphe joint. Diffuse demineralization. Vascular calcifications. Soft tissues are unremarkable. IMPRESSION: Osseous deformity of the distal radius which is likely related to remote prior fracture. Correlate with clinical history. No evidence of acute fracture or dislocation. Electronically Signed   By: Yetta Glassman M.D.   On: 05/25/2022 19:02   DG Elbow Complete Left  Result Date: 05/25/2022 CLINICAL DATA:  Recent fall with left elbow pain, initial encounter EXAM: LEFT ELBOW - COMPLETE 3+ VIEW COMPARISON:  None Available. FINDINGS: Mild olecranon spurring is noted. No acute fracture or dislocation is noted. No joint  effusion is seen. IMPRESSION: Mild degenerative change without acute abnormality. Electronically Signed   By: Inez Catalina M.D.   On: 05/25/2022 19:01   DG Knee Complete 4 Views Right  Result Date: 05/25/2022 CLINICAL DATA:  Fall. Right knee pain. EXAM: RIGHT KNEE - COMPLETE 4 VIEW COMPARISON:  Right knee radiographs 12/07/2018 and 05/20/2017 FINDINGS: Right total knee arthroplasty noted. The knee is located. No acute fracture or effusion is present. Vascular calcifications are present. IMPRESSION: 1. No acute abnormality. 2. Right total knee arthroplasty. 3. Atherosclerosis. Electronically Signed   By: San Morelle M.D.   On: 05/25/2022 18:58    Procedures Procedures    Medications Ordered in ED Medications - No data to display  ED Course/ Medical Decision Making/ A&P Clinical Course as of 05/25/22 2141  Sat May 25, 2022  1901 DG Knee Complete 4 Views Right No acute process [HS]  2031 This is a 88 year old gentleman presenting to ED after a fall in his garage today, does not recall what caused the fall but says he was up repairing a shelf.  He says he has had episodes of lightheadedness in the past and was working in the high heat in the garage today.  He was worked up in the past for possible cardiac causes of syncope or near syncope, including Holter monitor for 48 hours and echocardiogram in 2020, which showed no significant disease.  He was completely back to normal today.  He reports he has some tenderness in his right knee and also his left wrist, reports he has an old wrist fracture in his left wrist. [MT]  2037 Blood test and x-rays are largely unremarkable.  The patient has an old radial fracture does not correlate with his tenderness which she has at the distal ulna, do not suspect there is a new fracture here and overall he is well-appearing with full range of motion.  He will be referred back to cardiology whom he has not seen in 3 years, he prefers to see a doctor Domenic Polite  locally and will contact their office, but do think it is safe to discharge him at this time [MT]    Clinical Course User Index [HS] Sherrill Raring, PA-C [MT] Langston Masker Carola Rhine, MD                           Medical Decision Making Amount and/or Complexity of Data Reviewed Labs: ordered. Radiology: ordered. Decision-making details documented in ED Course.   Patient presents due to fall due to syncopal episode.  Differential is broad but includes traumatic injuries, ACS, PE, seizures, electrolyte derangement, arrhythmia.  Exam is nonfocal, lungs are clear to  auscultation bilaterally.  Some tenderness as documented above but neurovascular intact broadly speaking.  I ordered and viewed laboratory work-up. -CBC without leukocytosis or anemia -BMP without gross electrolyte derangement, slight AKI with a creatinine 1.53 but not grossly above previous. -Troponin 12.  I reviewed imaging.  See ED course, no acute findings found.  Patient was on cardiac monitoring, EKG shows sinus rhythm without any specific hemic changes.  I do not think is ACS given the lack of chest pain and acute ischemic findings.  Patient has been having syncopal episodes, underlying arrhythmia is definitely a consideration.  No evidence of TIA or stroke, I do not think this is a seizure either.  Ambulatory cardiology referral placed, return precautions discussed.  Discussed HPI, physical exam and plan of care for this patient with attending Octaviano Glow. The attending physician evaluated this patient as part of a shared visit and agrees with plan of care.        Final Clinical Impression(s) / ED Diagnoses Final diagnoses:  Fall, initial encounter  Near syncope    Rx / DC Orders ED Discharge Orders          Ordered    Ambulatory referral to Cardiology       Comments: If you have not heard from the Cardiology office within the next 72 hours please call [phone].   05/25/22 2139              Sherrill Raring, PA-C 05/25/22 2325    Wyvonnia Dusky, MD 05/26/22 1102

## 2022-05-25 NOTE — Discharge Instructions (Addendum)
Please follow-up with a cardiologist for another checkup on your heart.  Your blood work is reassuring today.

## 2022-05-25 NOTE — ED Triage Notes (Signed)
Pt to the ED after a fall in his garage. Pt complains of right knee pain and has a skin tear to his left arm.  Pt denies hitting his head. Pt is not sure is he lost consciousness. Pt denies taking blood thinners.

## 2022-05-25 NOTE — ED Notes (Signed)
Patient transported to X-ray 

## 2022-06-06 ENCOUNTER — Encounter: Payer: Self-pay | Admitting: Internal Medicine

## 2022-06-06 ENCOUNTER — Ambulatory Visit: Payer: Medicare Other | Attending: Internal Medicine | Admitting: Internal Medicine

## 2022-06-06 VITALS — BP 113/62 | HR 69 | Wt 148.6 lb

## 2022-06-06 DIAGNOSIS — R55 Syncope and collapse: Secondary | ICD-10-CM | POA: Diagnosis not present

## 2022-06-06 DIAGNOSIS — R079 Chest pain, unspecified: Secondary | ICD-10-CM | POA: Diagnosis not present

## 2022-06-06 NOTE — Patient Instructions (Signed)
Medication Instructions:  No Changes In Medications at this time.  *If you need a refill on your cardiac medications before your next appointment, please call your pharmacy*  Lab Work: None Ordered At This Time.  If you have labs (blood work) drawn today and your tests are completely normal, you will receive your results only by: [place] (if you have MyChart) OR A paper copy in the mail If you have any lab test that is abnormal or we need to change your treatment, we will call you to review the results.  Testing/Procedures: Your physician has requested that you have an echocardiogram. Echocardiography is a painless test that uses sound waves to create images of your heart. It provides your doctor with information about the size and shape of your heart and how well your heart's chambers and valves are working. You may receive an ultrasound enhancing agent through an IV if needed to better visualize your heart during the echo.This procedure takes approximately one hour. There are no restrictions for this procedure. This will take place at the [address].   [employer] Cardiac Event Monitor Instructions Your physician has requested you wear your cardiac event monitor for __30___ days. [employer] may call or text to confirm a shipping address. The monitor will be sent to a land address via UPS. [employer] will not ship a monitor to a PO BOX. It typically takes 3-5 days to receive your monitor after it has been enrolled. [employer] will assist with USPS tracking if your package is delayed. The telephone number for [employer] is [phone]. Once you have received your monitor, please review the enclosed instructions. Instruction tutorials can also be viewed under help and settings on the enclosed cell phone. Your monitor has already been registered assigning a specific monitor serial # to you.  Applying the monitor Remove cell phone from case and turn it on. The cell  phone works as Dealer and needs to be within [employer] of you at all times. The cell phone will need to be charged on a daily basis. We recommend you plug the cell phone into the enclosed charger at your bedside table every night.  Monitor batteries: You will receive two monitor batteries labelled #1 and #2. These are your recorders. Plug battery #2 onto the second connection on the enclosed charger. Keep one battery on the charger at all times. This will keep the monitor battery deactivated. It will also keep it fully charged for when you need to switch your monitor batteries. A small light will be blinking on the battery emblem when it is charging. The light on the battery emblem will remain on when the battery is fully charged.  Open package of a Monitor strip. Insert battery #1 into black hood on strip and gently squeeze monitor battery onto connection as indicated in instruction booklet. Set aside while preparing skin.  Choose location for your strip, vertical or horizontal, as indicated in the instruction booklet. Shave to remove all hair from location. There cannot be any lotions, oils, powders, or colognes on skin where monitor is to be applied. Wipe skin clean with enclosed Saline wipe. Dry skin completely.  Peel paper labeled #1 off the back of the Monitor strip exposing the adhesive. Place the monitor on the chest in the vertical or horizontal position shown in the instruction booklet. One arrow on the monitor strip must be pointing upward. Carefully remove paper labeled #2, attaching remainder of strip to your skin. Try not  to create any folds or wrinkles in the strip as you apply it.  Firmly press and release the circle in the center of the monitor battery. You will hear a small beep. This is turning the monitor battery on. The heart emblem on the monitor battery will light up every 5 seconds if the monitor battery in turned on and connected to the patient securely. Do  not push and hold the circle down as this turns the monitor battery off. The cell phone will locate the monitor battery. A screen will appear on the cell phone checking the connection of your monitor strip. This may read poor connection initially but change to good connection within the next minute. Once your monitor accepts the connection you will hear a series of 3 beeps followed by a climbing crescendo of beeps. A screen will appear on the cell phone showing the two monitor strip placement options. Touch the picture that demonstrates where you applied the monitor strip.  Your monitor strip and battery are waterproof. You are able to shower, bathe, or swim with the monitor on. They just ask you do not submerge deeper than 3 feet underwater. We recommend removing the monitor if you are swimming in a lake, river, or ocean.  Your monitor battery will need to be switched to a fully charged monitor battery approximately once a week. The cell phone will alert you of an action which needs to be made.  On the cell phone, tap for details to reveal connection status, monitor battery status, and cell phone battery status. The green dots indicates your monitor is in good status. A red dot indicates there is something that needs your attention.  To record a symptom, click the circle on the monitor battery. In 30-60 seconds a list of symptoms will appear on the cell phone. Select your symptom and tap save. Your monitor will record a sustained or significant arrhythmia regardless of you clicking the button. Some patients do not feel the heart rhythm irregularities. [employer] will notify us of any serious or critical events.  Refer to instruction booklet for instructions on switching batteries, changing strips, the Do not disturb or Pause features, or any additional questions.  Call [employer] at [phone], to confirm your monitor is transmitting and record your baseline. They will answer any  questions you may have regarding the monitor instructions at that time.  Returning the monitor to [place] all equipment back into blue box. Peel off strip of paper to expose adhesive and close box securely. There is a prepaid UPS shipping label on this box. Drop in a UPS drop box, or at a UPS facility like [employer]. You may also contact [employer] to arrange UPS to pick up monitor package at your home.  Follow-Up: At [facility], you and your health needs are our priority.  As part of our continuing mission to provide you with exceptional heart care, we have created designated Provider Care Teams.  These Care Teams include your primary Cardiologist (physician) and Advanced Practice Providers (APPs -  Physician Assistants and Nurse Practitioners) who all work together to provide you with the care you need, when you need it.  Your next appointment:   6 week(s) CAN BE IN [place] WITH APP IF MORE CONVENIENT   The format for your next appointment:   In Person  Provider:   Elouise Munroe, MD

## 2022-06-06 NOTE — Progress Notes (Signed)
Cardiology Office Note:    Date:  06/06/2022   ID:  Joshua Strong, DOB [birth year], MRN [id number]  PCP:  [name], MD  Cardiologist:  Elouise Munroe, MD  Electrophysiologist:  None   Referring MD: [name], MD   Chief Complaint/Reason for Referral: Syncope  History of Present Illness:    Joshua Strong is a 88 y.o. male with no significant past cardiovascular history, GERD, renal cell carcinoma, who presents for evaluation of syncope.   Syncope has occurred x 2. Has had this evaluated by cardiology previously, echocardiogram grossly normal in May 2020.   This episode he was working in the garage and fell backward and woke up on the ground. Unclear time of LOC.  Has bee noticing dizzy spells, will get dizzy in the shower or getting of bed. He will also however note it while sitting at the table.  EKG with RBBB and first deg avb, some conduction system disease.  He has also been having one month of CP and SOB. Not entirely clear if symptoms are related or independent of driver of syncope.   The patient denies palpitations, PND, orthopnea, or leg swelling. Denies cough, fever, chills. Denies nausea, vomiting.  Past Medical History:  Diagnosis Date   Arthritis    Dizziness    GERD (gastroesophageal reflux disease)    GI bleed    ulcer   Hemorrhoid    Memory loss    Pancreatitis    Pneumonia    Renal cell carcinoma ([place])    Syncope    Tremor    Tubular adenoma 2013    Past Surgical History:  Procedure Laterality Date   [place]   COLONOSCOPY  06/16/2007   Dr. Gala Romney- minimal internal hemorrhoids o/w normal rectum,tubular adenoma   COLONOSCOPY  07/02/2012   Dr.Rourk- normal rectum, 3 5-7 mm peduncuated polyos, one in the mid sigmoid segment and 2 in the ascending segment. remainder of the colonic mucosa appeared normal. bx= tubular adenoma   COLONOSCOPY N/A 07/17/2015   Procedure:  COLONOSCOPY;  Surgeon: Daneil Dolin, MD;  [facility];  Service: Endoscopy;  Laterality: N/A;  730    EYE SURGERY     cataract with lens implants   HAND SURGERY Right 1989, 1991   KNEE SURGERY Right 2000   NEPHRECTOMY Right 2000   pancreatic duct stent     mult times   SHOULDER SURGERY Left 09/16/2011   SPINAL FUSION     testicular tumor  1989   TOTAL KNEE ARTHROPLASTY Right 05/24/2016   Procedure: RIGHT TOTAL KNEE ARTHROPLASTY;  Surgeon: Mcarthur Rossetti, MD;  Location: [facility];  Service: Orthopedics;  Laterality: Right;    Current Medications: Current Meds  Medication Sig   acetaminophen (TYLENOL) 500 MG tablet Take 500 mg by mouth every 6 (six) hours as needed for mild pain.   albuterol (VENTOLIN HFA) 108 (90 Base) MCG/ACT inhaler Inhale 2 puffs into the lungs every 6 (six) hours as needed for wheezing or shortness of breath.   Apoaequorin (PREVAGEN PO) Take by mouth daily.   OVER THE COUNTER MEDICATION Super Beta Prostate twice daily   pantoprazole (PROTONIX) 20 MG tablet Take 1 tablet (20 mg total) by mouth daily.   polyethylene glycol (MIRALAX / GLYCOLAX) 17 g packet Take 17 g by mouth daily.   PRESCRIPTION MEDICATION Injection every 6 months for  osteoporosis (receives at [facility])     Allergies:   Morphine and related and Metoclopramide hcl   Social History   Tobacco Use   Smoking status: Former    Packs/day: 1.00    Years: 3.00    Total pack years: 3.00    Types: Cigarettes    Quit date: 05/17/1956    Years since quitting: 66.1   Smokeless tobacco: Never   Tobacco comments:    quit in 1958  Vaping Use   Vaping Use: Never used  Substance Use Topics   Alcohol use: No    Comment: 08/11/19 "too much for 6 yrs", quit in 1958   Drug use: No     Family History: The patient's family history includes Cancer in his mother. He was adopted.  ROS:   Please see the history of present illness.    All other systems reviewed and are  negative.  EKGs/Labs/Other Studies Reviewed:    The following studies were reviewed today:  EKG:  SR 1st deg AVB, RBBB QRS 140 ms  Imaging studies that I have independently reviewed today: n/a  Recent Labs: 05/25/2022: BUN 28; Creatinine, Ser 1.53; Hemoglobin 14.9; Platelets 213; Potassium 4.7; Sodium 139  Recent Lipid Panel No results found for: "CHOL", "TRIG", "HDL", "CHOLHDL", "VLDL", "LDLCALC", "LDLDIRECT"  Physical Exam:    VS:  Pulse 69   Wt 148 lb 9.6 oz (67.4 kg)   SpO2 98%   BMI 23.98 kg/m     BP 113/62  weight is 148 lb 9.6 oz (67.4 kg). His blood pressure is 113/62 and his pulse is 69. His oxygen saturation is 98%.    Wt Readings from Last 5 Encounters:  06/06/22 148 lb 9.6 oz (67.4 kg)  05/25/22 158 lb 1.1 oz (71.7 kg)  03/28/21 158 lb (71.7 kg)  08/11/19 158 lb 12.8 oz (72 kg)  02/02/19 155 lb (70.3 kg)    Constitutional: No acute distress Eyes: sclera non-icteric, normal conjunctiva and lids ENMT: normal dentition, moist mucous membranes Cardiovascular: regular rhythm, normal rate, no murmur. S1 and S2 normal. No jugular venous distention.  Respiratory: clear to auscultation bilaterally GI : normal bowel sounds, soft and nontender. No distention.   MSK: extremities warm, well perfused. No edema.  NEURO: grossly nonfocal exam, moves all extremities. PSYCH: alert and oriented x 3, normal mood and affect.   ASSESSMENT:    1. Syncope, unspecified syncope type   2. Chest pain of uncertain etiology    PLAN:    Syncope, unspecified syncope type - Plan: EKG 12-Lead, ECHOCARDIOGRAM COMPLETE, CARDIAC EVENT MONITOR  Chest pain of uncertain etiology  Participated in shared decision making, wife present who provides collaborative history.  Will obtain echo and event monitor to screen for conduction system disease and structural heart disease that could contribute to syncope.  Describe chest pain. Will see what testing shows and if CP continues, would consider  nuclear stress test.    Total time of encounter: 45 minutes total time of encounter, including 30 minutes spent in face-to-face patient care on the date of this encounter. This time includes coordination of care and counseling regarding above mentioned problem list. Remainder of non-face-to-face time involved reviewing chart documents/testing relevant to the patient encounter and documentation in the medical record. I have independently reviewed documentation from referring provider.   Cherlynn Kaiser, MD, [place]   Shared Decision Making/Informed Consent:       Medication Adjustments/Labs and Tests Ordered: Current medicines are reviewed  at length with the patient today.  Concerns regarding medicines are outlined above.   Orders Placed This Encounter  Procedures   CARDIAC EVENT MONITOR   EKG 12-Lead   ECHOCARDIOGRAM COMPLETE    No orders of the defined types were placed in this encounter.   Patient Instructions  Medication Instructions:  No Changes In Medications at this time.  *If you need a refill on your cardiac medications before your next appointment, please call your pharmacy*  Lab Work: None Ordered At This Time.  If you have labs (blood work) drawn today and your tests are completely normal, you will receive your results only by: [place] (if you have MyChart) OR A paper copy in the mail If you have any lab test that is abnormal or we need to change your treatment, we will call you to review the results.  Testing/Procedures: Your physician has requested that you have an echocardiogram. Echocardiography is a painless test that uses sound waves to create images of your heart. It provides your doctor with information about the size and shape of your heart and how well your heart's chambers and valves are working. You may receive an ultrasound enhancing agent through an IV if needed to better visualize your heart during the echo.This  procedure takes approximately one hour. There are no restrictions for this procedure. This will take place at the [address], [address].   [employer] Cardiac Event Monitor Instructions Your physician has requested you wear your cardiac event monitor for __30___ days. [employer] may call or text to confirm a shipping address. The monitor will be sent to a land address via UPS. [employer] will not ship a monitor to a PO BOX. It typically takes 3-5 days to receive your monitor after it has been enrolled. [employer] will assist with USPS tracking if your package is delayed. The telephone number for [employer] is [phone]. Once you have received your monitor, please review the enclosed instructions. Instruction tutorials can also be viewed under help and settings on the enclosed cell phone. Your monitor has already been registered assigning a specific monitor serial # to you.  Applying the monitor Remove cell phone from case and turn it on. The cell phone works as Dealer and needs to be within [employer] of you at all times. The cell phone will need to be charged on a daily basis. We recommend you plug the cell phone into the enclosed charger at your bedside table every night.  Monitor batteries: You will receive two monitor batteries labelled #1 and #2. These are your recorders. Plug battery #2 onto the second connection on the enclosed charger. Keep one battery on the charger at all times. This will keep the monitor battery deactivated. It will also keep it fully charged for when you need to switch your monitor batteries. A small light will be blinking on the battery emblem when it is charging. The light on the battery emblem will remain on when the battery is fully charged.  Open package of a Monitor strip. Insert battery #1 into black hood on strip and gently squeeze monitor battery onto connection as indicated in instruction booklet. Set aside while preparing  skin.  Choose location for your strip, vertical or horizontal, as indicated in the instruction booklet. Shave to remove all hair from location. There cannot be any lotions, oils, powders, or colognes on skin where monitor is to be applied. Wipe skin clean with enclosed Saline wipe. Dry skin completely.  Peel  paper labeled #1 off the back of the Monitor strip exposing the adhesive. Place the monitor on the chest in the vertical or horizontal position shown in the instruction booklet. One arrow on the monitor strip must be pointing upward. Carefully remove paper labeled #2, attaching remainder of strip to your skin. Try not to create any folds or wrinkles in the strip as you apply it.  Firmly press and release the circle in the center of the monitor battery. You will hear a small beep. This is turning the monitor battery on. The heart emblem on the monitor battery will light up every 5 seconds if the monitor battery in turned on and connected to the patient securely. Do not push and hold the circle down as this turns the monitor battery off. The cell phone will locate the monitor battery. A screen will appear on the cell phone checking the connection of your monitor strip. This may read poor connection initially but change to good connection within the next minute. Once your monitor accepts the connection you will hear a series of 3 beeps followed by a climbing crescendo of beeps. A screen will appear on the cell phone showing the two monitor strip placement options. Touch the picture that demonstrates where you applied the monitor strip.  Your monitor strip and battery are waterproof. You are able to shower, bathe, or swim with the monitor on. They just ask you do not submerge deeper than 3 feet underwater. We recommend removing the monitor if you are swimming in a lake, river, or ocean.  Your monitor battery will need to be switched to a fully charged monitor battery approximately once a  week. The cell phone will alert you of an action which needs to be made.  On the cell phone, tap for details to reveal connection status, monitor battery status, and cell phone battery status. The green dots indicates your monitor is in good status. A red dot indicates there is something that needs your attention.  To record a symptom, click the circle on the monitor battery. In 30-60 seconds a list of symptoms will appear on the cell phone. Select your symptom and tap save. Your monitor will record a sustained or significant arrhythmia regardless of you clicking the button. Some patients do not feel the heart rhythm irregularities. [employer] will notify us of any serious or critical events.  Refer to instruction booklet for instructions on switching batteries, changing strips, the Do not disturb or Pause features, or any additional questions.  Call [employer] at [phone], to confirm your monitor is transmitting and record your baseline. They will answer any questions you may have regarding the monitor instructions at that time.  Returning the monitor to [place] all equipment back into blue box. Peel off strip of paper to expose adhesive and close box securely. There is a prepaid UPS shipping label on this box. Drop in a UPS drop box, or at a UPS facility like [employer]. You may also contact [employer] to arrange UPS to pick up monitor package at your home.  Follow-Up: At [facility], you and your health needs are our priority.  As part of our continuing mission to provide you with exceptional heart care, we have created designated Provider Care Teams.  These Care Teams include your primary Cardiologist (physician) and Advanced Practice Providers (APPs -  Physician Assistants and Nurse Practitioners) who all work together to provide you with the care you need, when you need it.  Your next appointment:  6 week(s) CAN BE IN [place] WITH APP IF MORE CONVENIENT    The format for your next appointment:   In Person  Provider:   Elouise Munroe, MD

## 2022-06-07 ENCOUNTER — Encounter: Payer: Self-pay | Admitting: *Deleted

## 2022-06-13 ENCOUNTER — Ambulatory Visit: Payer: Medicare Other | Attending: Internal Medicine

## 2022-06-13 DIAGNOSIS — R55 Syncope and collapse: Secondary | ICD-10-CM | POA: Diagnosis not present

## 2022-06-14 ENCOUNTER — Ambulatory Visit (HOSPITAL_COMMUNITY)
Admission: RE | Admit: 2022-06-14 | Discharge: 2022-06-14 | Disposition: A | Discharge status: Home / Self Care | Payer: Medicare Other | Source: Ambulatory Visit | Attending: Internal Medicine | Admitting: Internal Medicine

## 2022-06-14 DIAGNOSIS — R55 Syncope and collapse: Secondary | ICD-10-CM | POA: Insufficient documentation

## 2022-06-14 LAB — ECHOCARDIOGRAM COMPLETE
Area-P 1/2: 2.42 cm2
S' Lateral: 2.7 cm

## 2022-06-14 NOTE — Progress Notes (Signed)
*  PRELIMINARY RESULTS* Echocardiogram 2D Echocardiogram has been performed.  Joshua Strong 06/14/2022, 11:23 AM

## 2022-06-25 DIAGNOSIS — Z85820 Personal history of malignant melanoma of skin: Secondary | ICD-10-CM | POA: Diagnosis not present

## 2022-06-25 DIAGNOSIS — L570 Actinic keratosis: Secondary | ICD-10-CM | POA: Diagnosis not present

## 2022-06-25 DIAGNOSIS — D239 Other benign neoplasm of skin, unspecified: Secondary | ICD-10-CM | POA: Diagnosis not present

## 2022-07-02 DIAGNOSIS — M810 Age-related osteoporosis without current pathological fracture: Secondary | ICD-10-CM | POA: Diagnosis not present

## 2022-07-15 ENCOUNTER — Ambulatory Visit: Payer: Medicare Other | Attending: Internal Medicine | Admitting: Internal Medicine

## 2022-07-15 ENCOUNTER — Encounter: Payer: Self-pay | Admitting: Internal Medicine

## 2022-07-15 VITALS — BP 185/71 | HR 54 | Ht 65.5 in | Wt 150.6 lb

## 2022-07-15 DIAGNOSIS — R55 Syncope and collapse: Secondary | ICD-10-CM | POA: Diagnosis not present

## 2022-07-15 DIAGNOSIS — R079 Chest pain, unspecified: Secondary | ICD-10-CM | POA: Diagnosis not present

## 2022-07-15 NOTE — Patient Instructions (Signed)
Medication Instructions:  The current medical regimen is effective;  continue present plan and medications.  *If you need a refill on your cardiac medications before your next appointment, please call your pharmacy*   Follow-Up: At [facility], you and your health needs are our priority.  As part of our continuing mission to provide you with exceptional heart care, we have created designated Provider Care Teams.  These Care Teams include your primary Cardiologist (physician) and Advanced Practice Providers (APPs -  Physician Assistants and Nurse Practitioners) who all work together to provide you with the care you need, when you need it.  We recommend signing up for the patient portal called "MyChart".  Sign up information is provided on this After Visit Summary.  MyChart is used to connect with patients for Virtual Visits (Telemedicine).  Patients are able to view lab/test results, encounter notes, upcoming appointments, etc.  Non-urgent messages can be sent to your provider as well.   To learn more about what you can do with MyChart, go to [email or web address].    Your next appointment:   As needed  The format for your next appointment:   In Person  Provider:   Elouise Munroe, MD   Hydrate well, and liberalize salt intake

## 2022-07-15 NOTE — Progress Notes (Signed)
Cardiology Office Note:    Date:  07/15/2022   ID:  Joshua Strong, DOB [birth year], MRN [id number]  PCP:  [name], MD  Cardiologist:  Elouise Munroe, MD  Electrophysiologist:  None   Referring MD: [name], MD   Chief Complaint/Reason for Referral: Syncope  History of Present Illness:    Joshua Strong is a 88 y.o. male with no significant past cardiovascular history, GERD, renal cell carcinoma, who presents for evaluation of syncope.   07/15/22: no recurrent syncope. Feeling well overall. We do not have monitor results to review however no notifications during event monitor period. Here today with his son. Wearing compression socks and thinks that has helped a lot. Echo was unremarkable for source of syncope. Does have moderate MAC, no MS.   Initial consult: Syncope has occurred x 2. Has had this evaluated by cardiology previously, echocardiogram grossly normal in May 2020.   This episode he was working in the garage and fell backward and woke up on the ground. Unclear time of LOC.  Has bee noticing dizzy spells, will get dizzy in the shower or getting of bed. He will also however note it while sitting at the table.  EKG with RBBB and first deg avb, some conduction system disease.  He has also been having one month of CP and SOB. Not entirely clear if symptoms are related or independent of driver of syncope.   The patient denies palpitations, PND, orthopnea, or leg swelling. Denies cough, fever, chills. Denies nausea, vomiting.  Past Medical History:  Diagnosis Date   Arthritis    Dizziness    GERD (gastroesophageal reflux disease)    GI bleed    ulcer   Hemorrhoid    Memory loss    Pancreatitis    Pneumonia    Renal cell carcinoma ([place])    Syncope    Tremor    Tubular adenoma 2013    Past Surgical History:  Procedure Laterality Date   [place]   COLONOSCOPY  06/16/2007   Dr. Gala Romney-  minimal internal hemorrhoids o/w normal rectum,tubular adenoma   COLONOSCOPY  07/02/2012   Dr.Rourk- normal rectum, 3 5-7 mm peduncuated polyos, one in the mid sigmoid segment and 2 in the ascending segment. remainder of the colonic mucosa appeared normal. bx= tubular adenoma   COLONOSCOPY N/A 07/17/2015   Procedure: COLONOSCOPY;  Surgeon: Daneil Dolin, MD;  [facility];  Service: Endoscopy;  Laterality: N/A;  730    EYE SURGERY     cataract with lens implants   HAND SURGERY Right 1989, 1991   KNEE SURGERY Right 2000   NEPHRECTOMY Right 2000   pancreatic duct stent     mult times   SHOULDER SURGERY Left 09/16/2011   SPINAL FUSION     testicular tumor  1989   TOTAL KNEE ARTHROPLASTY Right 05/24/2016   Procedure: RIGHT TOTAL KNEE ARTHROPLASTY;  Surgeon: Mcarthur Rossetti, MD;  Location: [facility];  Service: Orthopedics;  Laterality: Right;    Current Medications: Current Meds  Medication Sig   acetaminophen (TYLENOL) 500 MG tablet Take 500 mg by mouth every 6 (six) hours as needed for mild pain.   OVER THE COUNTER MEDICATION Super Beta Prostate twice daily   pantoprazole (PROTONIX) 20 MG tablet Take 1 tablet (20 mg total) by mouth daily.   PRESCRIPTION MEDICATION Injection every 6 months  for osteoporosis (receives at [facility])     Allergies:   Morphine and related and Metoclopramide hcl   Social History   Tobacco Use   Smoking status: Former    Packs/day: 1.00    Years: 3.00    Total pack years: 3.00    Types: Cigarettes    Quit date: 05/17/1956    Years since quitting: 66.2   Smokeless tobacco: Never   Tobacco comments:    quit in [place] Use   Vaping Use: Never used  Substance Use Topics   Alcohol use: No    Comment: 08/11/19 "too much for 6 yrs", quit in 1958   Drug use: No     Family History: The patient's family history includes Cancer in his mother. He was adopted.  ROS:   Please see the history of present illness.    All other systems  reviewed and are negative.  EKGs/Labs/Other Studies Reviewed:    The following studies were reviewed today:  EKG:  SR 1st deg AVB, RBBB QRS 140 ms  Imaging studies that I have independently reviewed today: n/a  Recent Labs: 05/25/2022: BUN 28; Creatinine, Ser 1.53; Hemoglobin 14.9; Platelets 213; Potassium 4.7; Sodium 139  Recent Lipid Panel No results found for: "CHOL", "TRIG", "HDL", "CHOLHDL", "VLDL", "LDLCALC", "LDLDIRECT"  Physical Exam:    VS:  BP (!) 185/71 (BP Location: Left Arm, Patient Position: Sitting, Cuff Size: Normal)   Pulse (!) 54   Ht 5' 5.5" (1.664 m)   Wt 150 lb 9.6 oz (68.3 kg)   SpO2 99%   BMI 24.68 kg/m     BP 113/62  height is 5' 5.5" (1.664 m) and weight is 150 lb 9.6 oz (68.3 kg). His blood pressure is 185/71 (abnormal) and his pulse is 54 (abnormal). His oxygen saturation is 99%.    Wt Readings from Last 5 Encounters:  07/15/22 150 lb 9.6 oz (68.3 kg)  06/06/22 148 lb 9.6 oz (67.4 kg)  05/25/22 158 lb 1.1 oz (71.7 kg)  03/28/21 158 lb (71.7 kg)  08/11/19 158 lb 12.8 oz (72 kg)    Constitutional: No acute distress Eyes: sclera non-icteric, normal conjunctiva and lids ENMT: normal dentition, moist mucous membranes Cardiovascular: regular rhythm, normal rate, no murmur. S1 and S2 normal. No jugular venous distention.  Respiratory: clear to auscultation bilaterally GI : normal bowel sounds, soft and nontender. No distention.   MSK: extremities warm, well perfused. No edema.  NEURO: grossly nonfocal exam, moves all extremities. PSYCH: alert and oriented x 3, normal mood and affect.   ASSESSMENT:    1. Syncope, unspecified syncope type   2. Chest pain of uncertain etiology     PLAN:    Syncope, unspecified syncope type  Chest pain of uncertain etiology  Participated in shared decision making, son present who provides collaborative history.  Will review monitor results when available. No critical alerts during monitor period.  Echo  stable. No concern for chest pain at this time. If recurrent, would pursue stress testing.  Liberalize salt, hydrate well and wear compression socks.    Total time of encounter: 30 minutes total time of encounter, including 20 minutes spent in face-to-face patient care on the date of this encounter. This time includes coordination of care and counseling regarding above mentioned problem list. Remainder of non-face-to-face time involved reviewing chart documents/testing relevant to the patient encounter and documentation in the medical record. I have independently reviewed documentation from referring provider.   Cherlynn Kaiser, MD, [facility]  [place]  [facility]    Shared Decision Making/Informed Consent:       Medication Adjustments/Labs and Tests Ordered: Current medicines are reviewed at length with the patient today.  Concerns regarding medicines are outlined above.   No orders of the defined types were placed in this encounter.   No orders of the defined types were placed in this encounter.   Patient Instructions  Medication Instructions:  The current medical regimen is effective;  continue present plan and medications.  *If you need a refill on your cardiac medications before your next appointment, please call your pharmacy*   Follow-Up: At [facility], you and your health needs are our priority.  As part of our continuing mission to provide you with exceptional heart care, we have created designated Provider Care Teams.  These Care Teams include your primary Cardiologist (physician) and Advanced Practice Providers (APPs -  Physician Assistants and Nurse Practitioners) who all work together to provide you with the care you need, when you need it.  We recommend signing up for the patient portal called "MyChart".  Sign up information is provided on this After Visit Summary.  MyChart is used to connect with patients for Virtual Visits (Telemedicine).  Patients are  able to view lab/test results, encounter notes, upcoming appointments, etc.  Non-urgent messages can be sent to your provider as well.   To learn more about what you can do with MyChart, go to [email or web address].    Your next appointment:   As needed  The format for your next appointment:   In Person  Provider:   Elouise Munroe, MD   Hydrate well, and liberalize salt intake

## 2022-07-22 DIAGNOSIS — M79672 Pain in left foot: Secondary | ICD-10-CM | POA: Diagnosis not present

## 2022-07-22 DIAGNOSIS — L110 Acquired keratosis follicularis: Secondary | ICD-10-CM | POA: Diagnosis not present

## 2022-07-22 DIAGNOSIS — I739 Peripheral vascular disease, unspecified: Secondary | ICD-10-CM | POA: Diagnosis not present

## 2022-07-22 DIAGNOSIS — M79674 Pain in right toe(s): Secondary | ICD-10-CM | POA: Diagnosis not present

## 2022-07-22 DIAGNOSIS — M79671 Pain in right foot: Secondary | ICD-10-CM | POA: Diagnosis not present

## 2022-07-22 DIAGNOSIS — M79675 Pain in left toe(s): Secondary | ICD-10-CM | POA: Diagnosis not present

## 2022-08-16 DIAGNOSIS — Z23 Encounter for immunization: Secondary | ICD-10-CM | POA: Diagnosis not present

## 2022-08-24 ENCOUNTER — Other Ambulatory Visit: Payer: Self-pay

## 2022-08-24 ENCOUNTER — Encounter (HOSPITAL_COMMUNITY): Payer: Self-pay | Admitting: *Deleted

## 2022-08-24 ENCOUNTER — Emergency Department (HOSPITAL_COMMUNITY): Payer: Medicare Other

## 2022-08-24 ENCOUNTER — Emergency Department (HOSPITAL_COMMUNITY)
Admission: EM | Admit: 2022-08-24 | Discharge: 2022-08-24 | Disposition: A | Discharge status: Home / Self Care | Payer: Medicare Other | Attending: Emergency Medicine | Admitting: Emergency Medicine

## 2022-08-24 ENCOUNTER — Ambulatory Visit
Admission: EM | Admit: 2022-08-24 | Discharge: 2022-08-24 | Disposition: A | Discharge status: Home / Self Care | Payer: Medicare Other | Attending: Nurse Practitioner | Admitting: Nurse Practitioner

## 2022-08-24 DIAGNOSIS — R42 Dizziness and giddiness: Secondary | ICD-10-CM | POA: Insufficient documentation

## 2022-08-24 DIAGNOSIS — R5383 Other fatigue: Secondary | ICD-10-CM | POA: Diagnosis not present

## 2022-08-24 DIAGNOSIS — R519 Headache, unspecified: Secondary | ICD-10-CM | POA: Insufficient documentation

## 2022-08-24 DIAGNOSIS — R4182 Altered mental status, unspecified: Secondary | ICD-10-CM | POA: Diagnosis not present

## 2022-08-24 DIAGNOSIS — I6782 Cerebral ischemia: Secondary | ICD-10-CM | POA: Diagnosis not present

## 2022-08-24 DIAGNOSIS — R55 Syncope and collapse: Secondary | ICD-10-CM

## 2022-08-24 DIAGNOSIS — Z20822 Contact with and (suspected) exposure to covid-19: Secondary | ICD-10-CM | POA: Diagnosis not present

## 2022-08-24 DIAGNOSIS — R531 Weakness: Secondary | ICD-10-CM | POA: Insufficient documentation

## 2022-08-24 DIAGNOSIS — Z1152 Encounter for screening for COVID-19: Secondary | ICD-10-CM | POA: Insufficient documentation

## 2022-08-24 DIAGNOSIS — R079 Chest pain, unspecified: Secondary | ICD-10-CM | POA: Diagnosis not present

## 2022-08-24 DIAGNOSIS — G319 Degenerative disease of nervous system, unspecified: Secondary | ICD-10-CM | POA: Diagnosis not present

## 2022-08-24 DIAGNOSIS — I6529 Occlusion and stenosis of unspecified carotid artery: Secondary | ICD-10-CM | POA: Diagnosis not present

## 2022-08-24 DIAGNOSIS — R059 Cough, unspecified: Secondary | ICD-10-CM | POA: Diagnosis not present

## 2022-08-24 LAB — BLOOD GAS, VENOUS
Acid-Base Excess: 1.5 mmol/L (ref 0.0–2.0)
Bicarbonate: 26.6 mmol/L (ref 20.0–28.0)
Drawn by: 27160
O2 Saturation: 56.9 %
Patient temperature: 36.7
pCO2, Ven: 42 mmHg — ABNORMAL LOW (ref 44–60)
pH, Ven: 7.4 (ref 7.25–7.43)
pO2, Ven: 31 mmHg — CL (ref 32–45)

## 2022-08-24 LAB — CBC
HCT: 47.2 % (ref 39.0–52.0)
Hemoglobin: 15.8 g/dL (ref 13.0–17.0)
MCH: 30.5 pg (ref 26.0–34.0)
MCHC: 33.5 g/dL (ref 30.0–36.0)
MCV: 91.1 fL (ref 80.0–100.0)
Platelets: 227 10*3/uL (ref 150–400)
RBC: 5.18 MIL/uL (ref 4.22–5.81)
RDW: 13.2 % (ref 11.5–15.5)
WBC: 8.7 10*3/uL (ref 4.0–10.5)
nRBC: 0 % (ref 0.0–0.2)

## 2022-08-24 LAB — HEPATIC FUNCTION PANEL
ALT: 22 U/L (ref 0–44)
AST: 42 U/L — ABNORMAL HIGH (ref 15–41)
Albumin: 4.3 g/dL (ref 3.5–5.0)
Alkaline Phosphatase: 45 U/L (ref 38–126)
Bilirubin, Direct: 0.2 mg/dL (ref 0.0–0.2)
Indirect Bilirubin: 0.9 mg/dL (ref 0.3–0.9)
Total Bilirubin: 1.1 mg/dL (ref 0.3–1.2)
Total Protein: 7.7 g/dL (ref 6.5–8.1)

## 2022-08-24 LAB — BASIC METABOLIC PANEL
Anion gap: 9 (ref 5–15)
BUN: 21 mg/dL (ref 8–23)
CO2: 26 mmol/L (ref 22–32)
Calcium: 10.1 mg/dL (ref 8.9–10.3)
Chloride: 105 mmol/L (ref 98–111)
Creatinine, Ser: 1.33 mg/dL — ABNORMAL HIGH (ref 0.61–1.24)
GFR, Estimated: 51 mL/min — ABNORMAL LOW (ref >60)
Glucose, Bld: 89 mg/dL (ref 70–99)
Potassium: 5.1 mmol/L (ref 3.5–5.1)
Sodium: 140 mmol/L (ref 135–145)

## 2022-08-24 LAB — URINALYSIS, ROUTINE W REFLEX MICROSCOPIC
Bilirubin Urine: NEGATIVE
Glucose, UA: NEGATIVE mg/dL
Hgb urine dipstick: NEGATIVE
Ketones, ur: NEGATIVE mg/dL
Nitrite: NEGATIVE
Protein, ur: NEGATIVE mg/dL
Specific Gravity, Urine: 1.005 (ref 1.005–1.030)
pH: 7 (ref 5.0–8.0)

## 2022-08-24 LAB — RESP PANEL BY RT-PCR (FLU A&B, COVID) ARPGX2
Influenza A by PCR: NEGATIVE
Influenza B by PCR: NEGATIVE
SARS Coronavirus 2 by RT PCR: NEGATIVE

## 2022-08-24 LAB — TROPONIN I (HIGH SENSITIVITY)
Troponin I (High Sensitivity): 11 ng/L (ref <18)
Troponin I (High Sensitivity): 13 ng/L (ref <18)

## 2022-08-24 LAB — CBG MONITORING, ED: Glucose-Capillary: 80 mg/dL (ref 70–99)

## 2022-08-24 LAB — TSH: TSH: 1.329 u[IU]/mL (ref 0.350–4.500)

## 2022-08-24 LAB — MAGNESIUM: Magnesium: 2.4 mg/dL (ref 1.7–2.4)

## 2022-08-24 MED ORDER — LACTATED RINGERS IV BOLUS
1000.0000 mL | Freq: Once | INTRAVENOUS | Status: AC
  Administered 2022-08-24: 1000 mL via INTRAVENOUS

## 2022-08-24 MED ORDER — ACETAMINOPHEN 325 MG PO TABS
650.0000 mg | ORAL_TABLET | Freq: Once | ORAL | Status: AC
  Administered 2022-08-24: 650 mg via ORAL
  Filled 2022-08-24: qty 2

## 2022-08-24 NOTE — ED Triage Notes (Signed)
Pt c/o dizziness and headache with frequent falls; pt states he has been having them for a few months but they have gotten progressively worse over the last few weeks  Pt has been seen by a neurologist with no results

## 2022-08-24 NOTE — ED Provider Notes (Signed)
[facility] [facility] Provider Note   CSN: [id number] Arrival date & time: 08/24/22  1112     History {Add pertinent medical, surgical, social history, OB history to HPI:1} Chief Complaint  Patient presents with   Dizziness    Joshua Strong is a 88 y.o. male.   Dizziness Associated symptoms: chest pain, headaches and weakness (Generalized)   Patient presents for multiple symptoms.  Medical history includes arthritis, GERD, pancreatitis, RCC, balance disorder, lumbar spinal stenosis, cerebellar ataxia.  Per chart review, he was last seen by neurology in 2020 for his cerebellar ataxia.  At that time, he was noted to have similar symptoms that he describes today.  He underwent MRI of brain.  He has not followed up since that time.  Patient reports worsening severity of dizziness, headache, fatigue, and near syncope starting today.  He did have a recent cough and was concerned of COVID-19.  For this reason, he presented to urgent care.  Urgent care sent him to the ED for further evaluation.  Currently, patient is endorsing involuntary movements in his distal lower extremities.  He does not describe a cramping sensation.  He denies current chills.  He denies history of the same.  He also endorses a current mild bitemporal headache.  He has not taken anything for analgesia today.  He also describes episodes of feeling as if he is going to fall asleep.  Although he denies any currently, he does state that he had a brief transient episode of left-sided chest discomfort this morning.    Home Medications Prior to Admission medications   Medication Sig Start Date End Date Taking? Authorizing Provider  acetaminophen (TYLENOL) 500 MG tablet Take 500 mg by mouth every 6 (six) hours as needed for mild pain.    [provider]  albuterol (VENTOLIN HFA) 108 (90 Base) MCG/ACT inhaler Inhale 2 puffs into the lungs every 6 (six) hours as needed for wheezing or shortness of breath. Patient  not taking: Reported on 07/15/2022 05/18/22   Leath-Warren, Alda Lea, NP  Apoaequorin (PREVAGEN PO) Take by mouth daily. Patient not taking: Reported on 07/15/2022    [provider]  OVER THE COUNTER MEDICATION Super Beta Prostate twice daily    [provider]  pantoprazole (PROTONIX) 20 MG tablet Take 1 tablet (20 mg total) by mouth daily. 12/22/19   Carlis Stable, NP  polyethylene glycol (MIRALAX / GLYCOLAX) 17 g packet Take 17 g by mouth daily. Patient not taking: Reported on 07/15/2022    [provider]  PRESCRIPTION MEDICATION Injection every 6 months for osteoporosis (receives at [facility])    [provider]      Allergies    Morphine and related and Metoclopramide hcl    Review of Systems   Review of Systems  Constitutional:  Positive for activity change and fatigue.  Cardiovascular:  Positive for chest pain.  Neurological:  Positive for dizziness, tremors, weakness (Generalized), light-headedness and headaches.  All other systems reviewed and are negative.   Physical Exam Updated Vital Signs BP (!) 211/92   Pulse 61   Temp 98 F (36.7 C) (Oral)   Resp (!) 22   Ht '5\' 5"'$  (1.651 m)   Wt 63.5 kg   SpO2 100%   BMI 23.30 kg/m  Physical Exam Vitals and nursing note reviewed.  Constitutional:      General: He is not in acute distress.    Appearance: Normal appearance. He is well-developed and normal weight. He is  not ill-appearing, toxic-appearing or diaphoretic.  HENT:     Head: Normocephalic and atraumatic.     Right Ear: External ear normal.     Left Ear: External ear normal.     Nose: Nose normal.     Mouth/Throat:     Mouth: Mucous membranes are moist.     Pharynx: Oropharynx is clear.  Eyes:     Extraocular Movements: Extraocular movements intact.     Conjunctiva/sclera: Conjunctivae normal.  Cardiovascular:     Rate and Rhythm: Normal rate and regular rhythm.     Heart sounds: No murmur heard. Pulmonary:     Effort:  Pulmonary effort is normal. No respiratory distress.     Breath sounds: Normal breath sounds. No wheezing or rales.  Chest:     Chest wall: No tenderness.  Abdominal:     General: There is no distension.     Palpations: Abdomen is soft.     Tenderness: There is no abdominal tenderness.  Musculoskeletal:        General: No swelling. Normal range of motion.     Cervical back: Normal range of motion and neck supple.     Right lower leg: No edema.     Left lower leg: No edema.  Skin:    General: Skin is warm and dry.     Capillary Refill: Capillary refill takes less than 2 seconds.     Coloration: Skin is not jaundiced or pale.  Neurological:     General: No focal deficit present.     Mental Status: He is alert and oriented to person, place, and time.  Psychiatric:        Mood and Affect: Mood normal.        Behavior: Behavior normal.        Thought Content: Thought content normal.        Judgment: Judgment normal.     ED Results / Procedures / Treatments   Labs (all labs ordered are listed, but only abnormal results are displayed) Labs Reviewed  BASIC METABOLIC PANEL  CBC  URINALYSIS, ROUTINE W REFLEX MICROSCOPIC  CBG MONITORING, ED    EKG None  Radiology No results found.  Procedures Procedures  {Document cardiac monitor, telemetry assessment procedure when appropriate:1}  Medications Ordered in ED Medications - No data to display  ED Course/ Medical Decision Making/ A&P                           Medical Decision Making Amount and/or Complexity of Data Reviewed Labs: ordered. Radiology: ordered.  Risk OTC drugs.   This patient presents to the ED for concern of ***, this involves an extensive number of treatment options, and is a complaint that carries with it a high risk of complications and morbidity.  The differential diagnosis includes ***   Co morbidities that complicate the patient evaluation  ***   Additional history obtained:  Additional  history obtained from *** External records from outside source obtained and reviewed including ***   Lab Tests:  I Ordered, and personally interpreted labs.  The pertinent results include:  ***   Imaging Studies ordered:  I ordered imaging studies including ***  I independently visualized and interpreted imaging which showed *** I agree with the radiologist interpretation   Cardiac Monitoring: / EKG:  The patient was maintained on a cardiac monitor.  I personally viewed and interpreted the cardiac monitored which showed an underlying rhythm of: ***  Consultations Obtained:  I requested consultation with the ***,  and discussed lab and imaging findings as well as pertinent plan - they recommend: ***   Problem List / ED Course / Critical interventions / Medication management  Patient presents for multiple complaints.  He has a history of chronic dizziness, lightheadedness, and gait instability.  He was last seen by neurology for this in 2020.  Today his symptoms seem to more severe.  He went to urgent care but was sent to the ED for further evaluation.  On arrival in the ED, patient is hypertensive.  EKG shows normal sinus rhythm.  This fluctuates from 50s to 60s, which she states is lower than normal for him.  He has RBBB which has been seen on prior EKGs.  He states that he did have a brief episode of chest discomfort earlier this morning.  Lab work, including troponins were ordered.  During exam, he has frequent involuntary shaking in his distal lower extremities.  He denies any associated cramping pain.  He also will fall asleep while I was in the exam room.  He would be easily awakened and not confused.  Although triage note notes frequent falls, patient denies any falls.  He states that he has come close to falling due to his near syncopal symptoms.  He has no focal neurologic deficits.  He does have bilateral lower extremity weakness which he feels is chronic.  Prior to review, he  underwent MRI of lumbar spine in 2021 which showed advanced spinal stenosis.  Broad diagnostic work-up was initiated. ***. I ordered medication including ***  for ***  Reevaluation of the patient after these medicines showed that the patient {resolved/improved/worsened:23923::"improved"} I have reviewed the patients home medicines and have made adjustments as needed   Social Determinants of Health:  ***   Test / Admission - Considered:  ***   {Document critical care time when appropriate:1} {Document review of labs and clinical decision tools ie heart score, Chads2Vasc2 etc:1}  {Document your independent review of radiology images, and any outside records:1} {Document your discussion with family members, caretakers, and with consultants:1} {Document social determinants of health affecting pt's care:1} {Document your decision making why or why not admission, treatments were needed:1} Final Clinical Impression(s) / ED Diagnoses Final diagnoses:  None    Rx / DC Orders ED Discharge Orders     None

## 2022-08-24 NOTE — ED Notes (Signed)
Blood glucose was '80mg'$ /dL

## 2022-08-24 NOTE — ED Triage Notes (Signed)
Pt reports headache, fatigue and dizziness x 3-4 days; cough x 1 day. Dizziness is worse when trying to walk. Tylenol gives some relief.

## 2022-08-24 NOTE — Discharge Instructions (Signed)
Go to the emergency department for further evaluation of your symptoms.

## 2022-08-24 NOTE — ED Notes (Signed)
Unable to obtain labs from IV

## 2022-08-24 NOTE — ED Notes (Signed)
Patient is being discharged from the Urgent Care and sent to the Emergency Department via private vehicle . Per NP, patient is in need of higher level of care due to weakness, dizziness, and abnormal EKG. Patient is aware and verbalizes understanding of plan of care.  Vitals:   08/24/22 0944  BP: (!) 151/62  Pulse: (!) 55  Resp: 18  Temp: 97.8 F (36.6 C)  SpO2: 98%

## 2022-08-24 NOTE — Discharge Instructions (Signed)
The testing done in the ER today is reassuring.  Your symptoms may be a progression of your previously diagnosed cerebellar ataxia.  Below is a telephone number to call for a neurology follow-up.  Schedule follow-up for soon as possible.  They may be able to provide further testing and possible treatments for these chronic symptoms.  Stay hydrated. Return to the emergency department at any time for any new or worsening symptoms of concern.

## 2022-08-24 NOTE — ED Provider Notes (Signed)
[facility]    CSN: [id number] Arrival date & time: 08/24/22  3710      History   Chief Complaint Chief Complaint  Patient presents with   Headache   Dizziness    HPI Joshua Strong is a 88 y.o. male.   The history is provided by the patient and the spouse.   Patient brought in by his wife for complaints of dizziness, headache, and fatigue.  Patient's wife states that symptoms have worsened over the past several days.  Patient endorses a history of dizziness, but states that his symptoms are more "pronounced" at this time.  He states that he has these episodes that come over him and then he just falls asleep.  He states that he has had recent falls.  He was seen by cardiology over the last several months, he states that his evaluation was normal.  He states that he has also seen neurology in the past, but he was never told what was causing his symptoms.  He denies visual changes, light sensitivity, sound sensitivity, nausea, vomiting, feeling of the room spinning, or feeling like he is spinning.  He states that he was around someone approximately a week ago who had COVID.  Patient also endorses nasal congestion.  He states that he does have a history of recurrent sinus infections, but states this "feels different".  Past Medical History:  Diagnosis Date   Arthritis    Dizziness    GERD (gastroesophageal reflux disease)    GI bleed    ulcer   Hemorrhoid    Memory loss    Pancreatitis    Pneumonia    Renal cell carcinoma ([place])    Syncope    Tremor    Tubular adenoma 2013    Patient Active Problem List   Diagnosis Date Noted   Pain in left wrist 03/28/2021   Cerebellar ataxia ([place]) 11/15/2020   Balance disorder 11/15/2020   Spinal stenosis of lumbar region with neurogenic claudication 09/10/2019   Lumbar radiculopathy 09/10/2019   Presence of right artificial knee joint 05/20/2017   Unilateral primary osteoarthritis, left knee 05/20/2017   History of total  right knee replacement 02/17/2017   Chronic pain of left knee 02/17/2017   Osteoarthritis of right knee 05/24/2016   Status post total right knee replacement 05/24/2016   Diverticulosis of colon without hemorrhage    CONSTIPATION 04/19/2010   ABDOMINAL PAIN, LEFT LOWER QUADRANT 09/18/2009   EPIGASTRIC PAIN 09/18/2009   CARCINOMA, RENAL CELL 08/08/2008   PNEUMONIA 08/08/2008   ACUTE PANCREATITIS 08/08/2008   NAUSEA 08/08/2008   ABDOMINAL PAIN, CHRONIC 08/08/2008    Past Surgical History:  Procedure Laterality Date   [place]   COLONOSCOPY  06/16/2007   Dr. Gala Romney- minimal internal hemorrhoids o/w normal rectum,tubular adenoma   COLONOSCOPY  07/02/2012   Dr.Rourk- normal rectum, 3 5-7 mm peduncuated polyos, one in the mid sigmoid segment and 2 in the ascending segment. remainder of the colonic mucosa appeared normal. bx= tubular adenoma   COLONOSCOPY N/A 07/17/2015   Procedure: COLONOSCOPY;  Surgeon: Daneil Dolin, MD;  Location: [facility];  Service: Endoscopy;  Laterality: N/A;  730    EYE SURGERY     cataract with lens implants   HAND SURGERY Right 1989, 1991   KNEE SURGERY Right 2000   NEPHRECTOMY Right 2000   pancreatic duct stent  mult times   SHOULDER SURGERY Left 09/16/2011   SPINAL FUSION     testicular tumor  1989   TOTAL KNEE ARTHROPLASTY Right 05/24/2016   Procedure: RIGHT TOTAL KNEE ARTHROPLASTY;  Surgeon: Mcarthur Rossetti, MD;  Location: [facility];  Service: Orthopedics;  Laterality: Right;       Home Medications    Prior to Admission medications   Medication Sig Start Date End Date Taking? Authorizing Provider  acetaminophen (TYLENOL) 500 MG tablet Take 500 mg by mouth every 6 (six) hours as needed for mild pain.    [provider]  albuterol (VENTOLIN HFA) 108 (90 Base) MCG/ACT inhaler Inhale 2 puffs into the lungs every 6 (six) hours as needed for wheezing or shortness  of breath. Patient not taking: Reported on 07/15/2022 05/18/22   Elizjah Noblet-Warren, Alda Lea, NP  Apoaequorin (PREVAGEN PO) Take by mouth daily. Patient not taking: Reported on 07/15/2022    [provider]  OVER THE COUNTER MEDICATION Super Beta Prostate twice daily    [provider]  pantoprazole (PROTONIX) 20 MG tablet Take 1 tablet (20 mg total) by mouth daily. 12/22/19   Carlis Stable, NP  polyethylene glycol (MIRALAX / GLYCOLAX) 17 g packet Take 17 g by mouth daily. Patient not taking: Reported on 07/15/2022    [provider]  PRESCRIPTION MEDICATION Injection every 6 months for osteoporosis (receives at [facility])    [provider]    Family History Family History  Adopted: Yes  Problem Relation Age of Onset   Cancer Mother     Social History Social History   Tobacco Use   Smoking status: Former    Packs/day: 1.00    Years: 3.00    Total pack years: 3.00    Types: Cigarettes    Quit date: 05/17/1956    Years since quitting: 66.3   Smokeless tobacco: Never   Tobacco comments:    quit in [place] Use   Vaping Use: Never used  Substance Use Topics   Alcohol use: No    Comment: 08/11/19 "too much for 6 yrs", quit in 1958   Drug use: No     Allergies   Morphine and related and Metoclopramide hcl   Review of Systems Review of Systems Per HPI  Physical Exam Triage Vital Signs ED Triage Vitals  Enc Vitals Group     BP 08/24/22 0944 (!) 151/62     Pulse Rate 08/24/22 0944 (!) 55     Resp 08/24/22 0944 18     Temp 08/24/22 0944 97.8 F (36.6 C)     Temp Source 08/24/22 0944 Oral     SpO2 08/24/22 0944 98 %     Weight --      Height --      Head Circumference --      Peak Flow --      Pain Score 08/24/22 0943 8     Pain Loc --      Pain Edu? --      Excl. in [place]? --    No data found.  Updated Vital Signs BP (!) 151/62 (BP Location: Right Arm)   Pulse (!) 55   Temp 97.8 F (36.6 C) (Oral)   Resp 18   SpO2 98%    Visual Acuity Right Eye Distance:   Left Eye Distance:   Bilateral Distance:    Right Eye Near:   Left Eye Near:    Bilateral Near:     Physical  Exam Vitals and nursing note reviewed.  Constitutional:      General: He is not in acute distress.    Appearance: He is well-developed.  HENT:     Head: Normocephalic.  Eyes:     Extraocular Movements: Extraocular movements intact.     Pupils: Pupils are equal, round, and reactive to light.  Cardiovascular:     Rate and Rhythm: Regular rhythm.     Heart sounds: Normal heart sounds.  Pulmonary:     Effort: Pulmonary effort is normal. No respiratory distress.     Breath sounds: Normal breath sounds. No stridor. No wheezing, rhonchi or rales.  Abdominal:     General: Bowel sounds are normal.     Palpations: Abdomen is soft.  Musculoskeletal:     Cervical back: Normal range of motion.  Skin:    General: Skin is warm and dry.  Neurological:     General: No focal deficit present.     Mental Status: He is alert and oriented to person, place, and time.     GCS: GCS eye subscore is 4. GCS verbal subscore is 5. GCS motor subscore is 6.     Cranial Nerves: Cranial nerves 2-12 are intact. No cranial nerve deficit.     Sensory: Sensation is intact.     Motor: No weakness.     Coordination: Finger-Nose-Finger Test normal.  Psychiatric:        Mood and Affect: Mood normal.        Behavior: Behavior normal.      UC Treatments / Results  Labs (all labs ordered are listed, but only abnormal results are displayed) Labs Reviewed  SARS CORONAVIRUS 2 (TAT 6-24 HRS)    EKG: Sinus bradycardia with first-degree AV block, no STEMI.  Compared to EKG performed on 06/07/2022.   Radiology No results found.  Procedures Procedures (including critical care time)  Medications Ordered in UC Medications - No data to display  Initial Impression / Assessment and Plan / UC Course  I have reviewed the triage vital signs and the nursing  notes.  Pertinent labs & imaging results that were available during my care of the patient were reviewed by me and considered in my medical decision making (see chart for details).  Presents with progressive worsening of dizziness and headache that has been present over the past 3 to 4 days.  His wife reports that patient has episodes that are near syncope.  He also reports increased fatigue.  EKG was performed which showed sinus bradycardia, which is new, he continues to have a right bundle branch block.  No STEMI.  Based on the patient's presentation, and worsening of symptoms, recommend that he go to the emergency department for further evaluation.  Patient's vital signs are stable, he is able to travel via private vehicle with his spouse.  Patient verbalizes understanding, and he is in agreement with this recommendation. Final Clinical Impressions(s) / UC Diagnoses   Final diagnoses:  Encounter for screening for COVID-19   Discharge Instructions   None    ED Prescriptions   None    PDMP not reviewed this encounter.   Tish Men, NP 08/24/22 1047

## 2022-08-28 ENCOUNTER — Ambulatory Visit: Payer: Medicare Other | Admitting: Cardiology

## 2022-09-12 DIAGNOSIS — H40013 Open angle with borderline findings, low risk, bilateral: Secondary | ICD-10-CM | POA: Diagnosis not present

## 2022-09-16 ENCOUNTER — Telehealth: Payer: Self-pay | Admitting: Physical Medicine and Rehabilitation

## 2022-09-16 DIAGNOSIS — M5416 Radiculopathy, lumbar region: Secondary | ICD-10-CM

## 2022-09-16 NOTE — Telephone Encounter (Signed)
Patient called needing to schedule an appointment with Dr. Ernestina Patches for an injection in his back. The number to contact patient is [phone] or [phone]

## 2022-09-17 NOTE — Telephone Encounter (Signed)
Spoke with patient and he stated he is having the same type of pain as before. He did have a slight fall since the last injection in June. He stated he has been caring for his wife and has been lifting her and he has been hurting for about 3 weeks. Please advise

## 2022-09-17 NOTE — Telephone Encounter (Signed)
Spoke with patient and informed him that an order has been put in for injection and I will call him once it is approved. Verbalized understanding

## 2022-09-19 DIAGNOSIS — R7301 Impaired fasting glucose: Secondary | ICD-10-CM | POA: Diagnosis not present

## 2022-09-19 DIAGNOSIS — E7849 Other hyperlipidemia: Secondary | ICD-10-CM | POA: Diagnosis not present

## 2022-09-19 DIAGNOSIS — E7801 Familial hypercholesterolemia: Secondary | ICD-10-CM | POA: Diagnosis not present

## 2022-09-19 DIAGNOSIS — K2100 Gastro-esophageal reflux disease with esophagitis, without bleeding: Secondary | ICD-10-CM | POA: Diagnosis not present

## 2022-09-19 DIAGNOSIS — E7800 Pure hypercholesterolemia, unspecified: Secondary | ICD-10-CM | POA: Diagnosis not present

## 2022-09-19 DIAGNOSIS — E782 Mixed hyperlipidemia: Secondary | ICD-10-CM | POA: Diagnosis not present

## 2022-09-26 DIAGNOSIS — E7849 Other hyperlipidemia: Secondary | ICD-10-CM | POA: Diagnosis not present

## 2022-09-26 DIAGNOSIS — R42 Dizziness and giddiness: Secondary | ICD-10-CM | POA: Diagnosis not present

## 2022-09-26 DIAGNOSIS — R5383 Other fatigue: Secondary | ICD-10-CM | POA: Diagnosis not present

## 2022-09-26 DIAGNOSIS — N401 Enlarged prostate with lower urinary tract symptoms: Secondary | ICD-10-CM | POA: Diagnosis not present

## 2022-09-26 DIAGNOSIS — K859 Acute pancreatitis without necrosis or infection, unspecified: Secondary | ICD-10-CM | POA: Diagnosis not present

## 2022-09-26 DIAGNOSIS — R030 Elevated blood-pressure reading, without diagnosis of hypertension: Secondary | ICD-10-CM | POA: Diagnosis not present

## 2022-09-26 DIAGNOSIS — R7301 Impaired fasting glucose: Secondary | ICD-10-CM | POA: Diagnosis not present

## 2022-09-26 DIAGNOSIS — N1832 Chronic kidney disease, stage 3b: Secondary | ICD-10-CM | POA: Diagnosis not present

## 2022-09-26 DIAGNOSIS — H8190 Unspecified disorder of vestibular function, unspecified ear: Secondary | ICD-10-CM | POA: Diagnosis not present

## 2022-09-26 DIAGNOSIS — Z23 Encounter for immunization: Secondary | ICD-10-CM | POA: Diagnosis not present

## 2022-09-26 DIAGNOSIS — R4189 Other symptoms and signs involving cognitive functions and awareness: Secondary | ICD-10-CM | POA: Diagnosis not present

## 2022-09-27 DIAGNOSIS — N1832 Chronic kidney disease, stage 3b: Secondary | ICD-10-CM | POA: Diagnosis not present

## 2022-09-27 DIAGNOSIS — E7849 Other hyperlipidemia: Secondary | ICD-10-CM | POA: Diagnosis not present

## 2022-09-27 DIAGNOSIS — R030 Elevated blood-pressure reading, without diagnosis of hypertension: Secondary | ICD-10-CM | POA: Diagnosis not present

## 2022-09-27 DIAGNOSIS — Z6824 Body mass index (BMI) 24.0-24.9, adult: Secondary | ICD-10-CM | POA: Diagnosis not present

## 2022-09-27 DIAGNOSIS — F17210 Nicotine dependence, cigarettes, uncomplicated: Secondary | ICD-10-CM | POA: Diagnosis not present

## 2022-09-27 DIAGNOSIS — Z0001 Encounter for general adult medical examination with abnormal findings: Secondary | ICD-10-CM | POA: Diagnosis not present

## 2022-10-01 ENCOUNTER — Encounter: Payer: Self-pay | Admitting: Diagnostic Neuroimaging

## 2022-10-01 ENCOUNTER — Ambulatory Visit (INDEPENDENT_AMBULATORY_CARE_PROVIDER_SITE_OTHER): Payer: Medicare Other | Admitting: Diagnostic Neuroimaging

## 2022-10-01 VITALS — BP 160/74 | HR 68 | Ht 66.0 in | Wt 149.0 lb

## 2022-10-01 DIAGNOSIS — R799 Abnormal finding of blood chemistry, unspecified: Secondary | ICD-10-CM | POA: Diagnosis not present

## 2022-10-01 DIAGNOSIS — R42 Dizziness and giddiness: Secondary | ICD-10-CM | POA: Diagnosis not present

## 2022-10-01 NOTE — Progress Notes (Signed)
[facility]  PATIENT: Joshua Strong DOB: [birth year]  REFERRING CLINICIAN: Godfrey Pick, MD  HISTORY FROM: patient  REASON FOR VISIT: new consult    HISTORICAL  CHIEF COMPLAINT:  Chief Complaint  Patient presents with   New Patient (Initial Visit)    Rm 6, pt with, he states he has had 2 bad falls that were because of dizziness. He avg's three times a week having concerns with dizziness. He complains of headaches on left frontal/temporal headaches that are occurring frequently. States sometimes wakes up.Marland Kitchen He states sometimes the dizziness can be positional changes.     HISTORY OF PRESENT ILLNESS:   UPDATE (10/01/22, VRP): Since last visit, doing about the same. Ongoing intermittent  dizziness, lightheadedness, Symptoms are stable.  Mainly occurring during standing and moving.  Had an event where he fell off of a ladder due to passing out in August.  Has been to cardiology for some testing.  PRIOR HPI ([id number]): 89 year old male here for evaluation of memory loss.  Patient reports 1 to 2 years of progressive gait and balance difficulty, memory loss, lightheadedness.  Patient has been having lightheaded episodes when he stands up quickly.  He feels dizzy and faint like he might pass out.  He has fallen down and may have passed out with 1 of these episodes.  At other times he feels his gait and balance feels "off".  Also having some short-term memory problems and confusion.  ADLs not significant affected.    REVIEW OF SYSTEMS: Full 14 system review of systems performed and negative with exception of: As per HPI.  ALLERGIES: Allergies  Allergen Reactions   Morphine And Related Other (See Comments)    Hallucinations.   Metoclopramide Hcl Rash    HOME MEDICATIONS: Outpatient Medications Prior to Visit  Medication Sig Dispense Refill   acetaminophen (TYLENOL) 650 MG CR tablet Take 650 mg by mouth every 8 (eight) hours as needed for pain.     Calcium Carbonate  (CALTRATE 600 PO) Take 1 tablet by mouth daily.     denosumab (PROLIA) 60 MG/ML SOSY injection Inject 60 mg into the skin every 6 (six) months.     fluticasone (FLONASE) 50 MCG/ACT nasal spray Place 1 spray into both nostrils daily as needed for allergies or rhinitis.     meclizine (ANTIVERT) 12.5 MG tablet Take 12.5 mg by mouth 3 (three) times daily as needed for dizziness.     Multiple Vitamin (MULTIVITAMIN WITH MINERALS) TABS tablet Take 1 tablet by mouth daily.     OVER THE COUNTER MEDICATION Super Beta Prostate twice daily     pantoprazole (PROTONIX) 40 MG tablet Take 40 mg by mouth 2 (two) times daily.     Polyethyl Glycol-Propyl Glycol (SYSTANE FREE OP) Apply 1-2 drops to eye daily. Both eyes     acetaminophen (TYLENOL) 500 MG tablet Take 500 mg by mouth every 6 (six) hours as needed for mild pain.     No facility-administered medications prior to visit.    PAST MEDICAL HISTORY: Past Medical History:  Diagnosis Date   Arthritis    Dizziness    GERD (gastroesophageal reflux disease)    GI bleed    ulcer   Hemorrhoid    Memory loss    Pancreatitis    Pneumonia    Renal cell carcinoma (HCC)    Syncope    Tremor    Tubular adenoma 2013    PAST SURGICAL HISTORY: Past Surgical History:  Procedure Laterality Date  [place]   COLONOSCOPY  06/16/2007   Dr. Gala Romney- minimal internal hemorrhoids o/w normal rectum,tubular adenoma   COLONOSCOPY  07/02/2012   Dr.Rourk- normal rectum, 3 5-7 mm peduncuated polyos, one in the mid sigmoid segment and 2 in the ascending segment. remainder of the colonic mucosa appeared normal. bx= tubular adenoma   COLONOSCOPY N/A 07/17/2015   Procedure: COLONOSCOPY;  Surgeon: Daneil Dolin, MD;  [facility];  Service: Endoscopy;  Laterality: N/A;  730    EYE SURGERY     cataract with lens implants   HAND SURGERY Right 1989, 1991   KNEE SURGERY Right 2000    NEPHRECTOMY Right 2000   pancreatic duct stent     mult times   SHOULDER SURGERY Left 09/16/2011   SPINAL FUSION     testicular tumor  1989   TOTAL KNEE ARTHROPLASTY Right 05/24/2016   Procedure: RIGHT TOTAL KNEE ARTHROPLASTY;  Surgeon: Mcarthur Rossetti, MD;  Location: [facility];  Service: Orthopedics;  Laterality: Right;    FAMILY HISTORY: Family History  Adopted: Yes  Problem Relation Age of Onset   Cancer Mother     SOCIAL HISTORY: Social History   Socioeconomic History   Marital status: Married    Spouse name: Chrys Racer   Number of children: 3   Years of education: Not on file   Highest education level: Master's degree (e.g., MA, MS, MEng, MEd, MSW, MBA)  Occupational History    Comment: retired  Tobacco Use   Smoking status: Former    Packs/day: 1.00    Years: 3.00    Total pack years: 3.00    Types: Cigarettes    Quit date: 05/17/1956    Years since quitting: 66.4   Smokeless tobacco: Never   Tobacco comments:    quit in [place] Use   Vaping Use: Never used  Substance and Sexual Activity   Alcohol use: No    Comment: 08/11/19 "too much for 6 yrs", quit in 1958   Drug use: No   Sexual activity: Not on file  Other Topics Concern   Not on file  Social History Narrative   Lives with wife   Caffeine- decaf   Social Determinants of Health   Financial Resource Strain: Not on file  Food Insecurity: Not on file  Transportation Needs: Not on file  Physical Activity: Not on file  Stress: Not on file  Social Connections: Not on file  Intimate Partner Violence: Not on file     PHYSICAL EXAM  GENERAL EXAM/CONSTITUTIONAL: Vitals:  Vitals:   10/01/22 0929  BP: (!) 160/74  Pulse: 68  Weight: 149 lb (67.6 kg)  Height: '5\' 6"'$  (1.676 m)  Orthostatic VS for the past 24 hrs (Last 3 readings):  BP- Lying Pulse- Lying BP- Sitting Pulse- Sitting BP- Standing at 0 minutes Pulse- Standing at 0 minutes  10/01/22 0929 180/82 67 185/80 67 160/74 68    Body  mass index is 24.05 kg/m. Wt Readings from Last 3 Encounters:  10/01/22 149 lb (67.6 kg)  08/24/22 140 lb (63.5 kg)  07/15/22 150 lb 9.6 oz (68.3 kg)   Patient is in no distress; well developed, nourished and groomed; neck is supple  CARDIOVASCULAR: Examination of carotid arteries is normal; no carotid bruits Regular rate and rhythm, no murmurs Examination of peripheral vascular system by observation and palpation is normal  EYES: Ophthalmoscopic  exam of optic discs and posterior segments is normal; no papilledema or hemorrhages No results found.  MUSCULOSKELETAL: Gait, strength, tone, movements noted in Neurologic exam below  NEUROLOGIC: MENTAL STATUS:     08/11/2019    2:19 PM  MMSE - Mini Mental State Exam  Orientation to time 5  Orientation to Place 5  Registration 3  Attention/ Calculation 4  Recall 1  Language- name 2 objects 2  Language- repeat 0  Language- follow 3 step command 3  Language- read & follow direction 1  Write a sentence 1  Copy design 1  Total score 26   awake, alert, oriented to person, place and time recent and remote memory intact normal attention and concentration language fluent, comprehension intact, naming intact fund of knowledge appropriate  CRANIAL NERVE:  2nd - no papilledema on fundoscopic exam 2nd, 3rd, 4th, 6th - pupils equal and reactive to light, visual fields full to confrontation, extraocular muscles intact, no nystagmus 5th - facial sensation symmetric 7th - facial strength symmetric 8th - hearing intact 9th - palate elevates symmetrically, uvula midline 11th - shoulder shrug symmetric 12th - tongue protrusion midline  MOTOR:  normal bulk and tone, full strength in the BUE, BLE  SENSORY:  normal and symmetric to light touch, temperature, vibration  COORDINATION:  finger-nose-finger, fine finger movements normal  REFLEXES:  deep tendon reflexes TRACE and symmetric  GAIT/STATION:  WIDE based gait; UNSTEADY;  USING CANE     DIAGNOSTIC DATA (LABS, IMAGING, TESTING) - I reviewed patient records, labs, notes, testing and imaging myself where available.  Lab Results  Component Value Date   WBC 8.7 08/24/2022   HGB 15.8 08/24/2022   HCT 47.2 08/24/2022   MCV 91.1 08/24/2022   PLT 227 08/24/2022      Component Value Date/Time   NA 140 08/24/2022 1151   K 5.1 08/24/2022 1151   CL 105 08/24/2022 1151   CO2 26 08/24/2022 1151   GLUCOSE 89 08/24/2022 1151   BUN 21 08/24/2022 1151   CREATININE 1.33 (H) 08/24/2022 1151   CREATININE 1.59 (H) 10/27/2018 1246   CALCIUM 10.1 08/24/2022 1151   PROT 7.7 08/24/2022 1151   ALBUMIN 4.3 08/24/2022 1151   AST 42 (H) 08/24/2022 1151   ALT 22 08/24/2022 1151   ALKPHOS 45 08/24/2022 1151   BILITOT 1.1 08/24/2022 1151   GFRNONAA 51 (L) 08/24/2022 1151   GFRAA 46 (L) 05/25/2016 0415   No results found for: "CHOL", "HDL", "LDLCALC", "LDLDIRECT", "TRIG", "CHOLHDL" No results found for: "HGBA1C" No results found for: "VITAMINB12" Lab Results  Component Value Date   TSH 1.329 08/24/2022     06/07/10 MRI brain - nothing acute - mild atrophy - mild chronic small vessel ischemic disease   08/28/19 MRI brain (without) demonstrating: - Mild perisylvian and mesial temporal atrophy. - No acute findings.  08/24/22 CT head  1. No evidence of acute intracranial abnormality. 2. Mild atrophy and chronic small-vessel white matter ischemic changes.   ASSESSMENT AND PLAN  89 y.o. year old male here with recurrent dizziness, lightheadedness.   Dx:  1. Dizziness   2. Abnormal finding of blood chemistry, unspecified     PLAN:  RECURRENT DIZZINESS, SYNCOPE, CHEST PRESSURE, ORTHOSTATIC LIGHTHEADEDNESS (mainly when standing up) - follow up with cardiology and PCP; no driving until event free x 6 months (last event 05/25/22); caution with heights, ladders etc - check labs - monitor BP at home Orthostatic VS for the past 24 hrs (Last 3 readings):  BP-  Lying Pulse- Lying BP- Sitting Pulse- Sitting BP- Standing at 0 minutes Pulse- Standing at 0 minutes  10/01/22 0929 180/82 67 185/80 67 160/74 68    Orders Placed This Encounter  Procedures   Vitamin B12   Hemoglobin J0K   Basic Metabolic Panel   Return for pending if symptoms worsen or fail to improve, return to PCP.    Penni Bombard, MD 06/02/8181, 99:37 AM Certified in Neurology, Neurophysiology and Neuroimaging   [facility] [address] [place], [place] [address] [phone]

## 2022-10-01 NOTE — Patient Instructions (Addendum)
RECURRENT DIZZINESS, SYNCOPE, CHEST PRESSURE, ORTHOSTATIC LIGHTHEADEDNESS (mainly when standing up) - follow up with cardiology and PCP; no driving until event free x 6 months (last event 05/25/22); caution with heights, ladders etc - monitor BP at home - check labs

## 2022-10-02 LAB — BASIC METABOLIC PANEL
BUN/Creatinine Ratio: 18 (ref 10–24)
BUN: 28 mg/dL — ABNORMAL HIGH (ref 8–27)
CO2: 21 mmol/L (ref 20–29)
Calcium: 10.5 mg/dL — ABNORMAL HIGH (ref 8.6–10.2)
Chloride: 98 mmol/L (ref 96–106)
Creatinine, Ser: 1.54 mg/dL — ABNORMAL HIGH (ref 0.76–1.27)
Glucose: 110 mg/dL — ABNORMAL HIGH (ref 70–99)
Potassium: 5.2 mmol/L (ref 3.5–5.2)
Sodium: 135 mmol/L (ref 134–144)
eGFR: 43 mL/min/{1.73_m2} — ABNORMAL LOW (ref >59)

## 2022-10-02 LAB — HEMOGLOBIN A1C
Est. average glucose Bld gHb Est-mCnc: 123 mg/dL
Hgb A1c MFr Bld: 5.9 % — ABNORMAL HIGH (ref 4.8–5.6)

## 2022-10-02 LAB — VITAMIN B12: Vitamin B-12: 877 pg/mL (ref 232–1245)

## 2022-10-03 ENCOUNTER — Ambulatory Visit: Payer: Self-pay

## 2022-10-03 ENCOUNTER — Ambulatory Visit (INDEPENDENT_AMBULATORY_CARE_PROVIDER_SITE_OTHER): Payer: Medicare Other | Admitting: Physical Medicine and Rehabilitation

## 2022-10-03 DIAGNOSIS — M5416 Radiculopathy, lumbar region: Secondary | ICD-10-CM

## 2022-10-03 MED ORDER — METHYLPREDNISOLONE ACETATE 80 MG/ML IJ SUSP
80.0000 mg | Freq: Once | INTRAMUSCULAR | Status: AC
  Administered 2022-10-03: 80 mg

## 2022-10-03 NOTE — Patient Instructions (Signed)

## 2022-10-03 NOTE — Progress Notes (Signed)
Functional Pain Scale - descriptive words and definitions  Unmanageable (7)  Pain interferes with normal ADL's/nothing seems to help/sleep is very difficult/active distractions are very difficult to concentrate on. Severe range order  Average Pain 7   +Driver, -BT, -Dye Allergies.

## 2022-10-07 DIAGNOSIS — M79672 Pain in left foot: Secondary | ICD-10-CM | POA: Diagnosis not present

## 2022-10-07 DIAGNOSIS — I739 Peripheral vascular disease, unspecified: Secondary | ICD-10-CM | POA: Diagnosis not present

## 2022-10-07 DIAGNOSIS — M79675 Pain in left toe(s): Secondary | ICD-10-CM | POA: Diagnosis not present

## 2022-10-07 DIAGNOSIS — L110 Acquired keratosis follicularis: Secondary | ICD-10-CM | POA: Diagnosis not present

## 2022-10-07 DIAGNOSIS — M79674 Pain in right toe(s): Secondary | ICD-10-CM | POA: Diagnosis not present

## 2022-10-07 DIAGNOSIS — M79671 Pain in right foot: Secondary | ICD-10-CM | POA: Diagnosis not present

## 2022-10-10 NOTE — Progress Notes (Signed)
[facility] Note  10/17/2022     CHIEF COMPLAINT Patient presents for Retina Follow Up   HISTORY OF PRESENT ILLNESS: Joshua Strong is a 89 y.o. male who presents to the clinic today for:   HPI     Retina Follow Up   Patient presents with  Dry AMD.  In both eyes.  This started 7 months ago.  I, the attending physician,  performed the HPI with the patient and updated documentation appropriately.        Comments   Patient here for 7 months retina follow up for non exu ARMD OU. Patient states vision doing pretty good. Has new glasses. Sometimes has eye pain OD behind eye. Has headaches and has dizzy spells. Uses systane prn.      Last edited by Bernarda Caffey, MD on 10/17/2022 12:31 PM.    Pt states vision is stable, he has been having problems with dizziness  Referring physician: Leticia Clas, [address]  [place], [place] [address]  HISTORICAL INFORMATION:   Selected notes from the MEDICAL RECORD NUMBER Referred by Dr. Rosana Hoes -- concern for GCA LEE:  Ocular Hx- PMH-    CURRENT MEDICATIONS: Current Outpatient Medications (Ophthalmic Drugs)  Medication Sig   Polyethyl Glycol-Propyl Glycol (SYSTANE FREE OP) Apply 1-2 drops to eye daily. Both eyes   No current facility-administered medications for this visit. (Ophthalmic Drugs)   Current Outpatient Medications (Other)  Medication Sig   acetaminophen (TYLENOL) 650 MG CR tablet Take 650 mg by mouth every 8 (eight) hours as needed for pain.   Calcium Carbonate (CALTRATE 600 PO) Take 1 tablet by mouth daily.   denosumab (PROLIA) 60 MG/ML SOSY injection Inject 60 mg into the skin every 6 (six) months.   fluticasone (FLONASE) 50 MCG/ACT nasal spray Place 1 spray into both nostrils daily as needed for allergies or rhinitis.   meclizine (ANTIVERT) 12.5 MG tablet Take 12.5 mg by mouth 3 (three) times daily as needed for dizziness.   Multiple Vitamin (MULTIVITAMIN WITH MINERALS) TABS tablet  Take 1 tablet by mouth daily.   OVER THE COUNTER MEDICATION Super Beta Prostate twice daily   pantoprazole (PROTONIX) 40 MG tablet Take 40 mg by mouth 2 (two) times daily.   Current Facility-Administered Medications (Other)  Medication Route   methylPREDNISolone acetate (DEPO-MEDROL) injection 80 mg Other   REVIEW OF SYSTEMS: ROS   Positive for: Eyes Negative for: Constitutional, Gastrointestinal, Neurological, Skin, Genitourinary, Musculoskeletal, HENT, Endocrine, Cardiovascular, Respiratory, Psychiatric, Allergic/Imm, Heme/Lymph Last edited by Theodore Demark, COA on 10/17/2022  9:26 AM.     ALLERGIES Allergies  Allergen Reactions   Morphine And Related Other (See Comments)    Hallucinations.   Metoclopramide Hcl Rash   PAST MEDICAL HISTORY Past Medical History:  Diagnosis Date   Arthritis    Dizziness    GERD (gastroesophageal reflux disease)    GI bleed    ulcer   Hemorrhoid    Memory loss    Pancreatitis    Pneumonia    Renal cell carcinoma ([place])    Syncope    Tremor    Tubular adenoma 2013   Past Surgical History:  Procedure Laterality Date   [place]  06/16/2007   Dr. Gala Romney- minimal internal hemorrhoids o/w normal rectum,tubular adenoma   COLONOSCOPY  07/02/2012   Dr.Rourk-  normal rectum, 3 5-7 mm peduncuated polyos, one in the mid sigmoid segment and 2 in the ascending segment. remainder of the colonic mucosa appeared normal. bx= tubular adenoma   COLONOSCOPY N/A 07/17/2015   Procedure: COLONOSCOPY;  Surgeon: Daneil Dolin, MD;  [facility];  Service: Endoscopy;  Laterality: N/A;  730    EYE SURGERY     cataract with lens implants   HAND SURGERY Right 1989, 1991   KNEE SURGERY Right 2000   NEPHRECTOMY Right 2000   pancreatic duct stent     mult times   SHOULDER SURGERY Left 09/16/2011   SPINAL FUSION     testicular tumor  1989   TOTAL KNEE ARTHROPLASTY  Right 05/24/2016   Procedure: RIGHT TOTAL KNEE ARTHROPLASTY;  Surgeon: Mcarthur Rossetti, MD;  Location: [facility];  Service: Orthopedics;  Laterality: Right;   FAMILY HISTORY Family History  Adopted: Yes  Problem Relation Age of Onset   Cancer Mother    SOCIAL HISTORY Social History   Tobacco Use   Smoking status: Former    Packs/day: 1.00    Years: 3.00    Total pack years: 3.00    Types: Cigarettes    Quit date: 05/17/1956    Years since quitting: 66.4   Smokeless tobacco: Never   Tobacco comments:    quit in [place] Use   Vaping Use: Never used  Substance Use Topics   Alcohol use: No    Comment: 08/11/19 "too much for 6 yrs", quit in 1958   Drug use: No       OPHTHALMIC EXAM:  Base Eye Exam     Visual Acuity (Snellen - Linear)       Right Left   Dist cc 20/20 -1 20/20    Correction: Glasses         Tonometry (Tonopen, 9:22 AM)       Right Left   Pressure 16 15         Pupils       Dark Light Shape React APD   Right 1.5 1 Round Minimal None   Left 1.5 1 Round Minimal None         Visual Fields (Counting fingers)       Left Right    Full Full         Extraocular Movement       Right Left    Full, Ortho Full, Ortho         Neuro/Psych     Oriented x3: Yes   Mood/Affect: Normal         Dilation     Both eyes: 1.0% Mydriacyl, 2.5% Phenylephrine @ 9:21 AM           Slit Lamp and Fundus Exam     Slit Lamp Exam       Right Left   Lids/Lashes Dermatochalasis - upper lid, mild MGD Dermatochalasis - upper lid, mild MGD   Conjunctiva/Sclera White and quiet White and quiet   Cornea arcus, well healed cataract wound, nasal LRI, trace tear film debris Mild arcus, well healed cataract wound, nasal LRI, trace PEE, trace fine endopigment   Anterior Chamber Deep and quiet Deep and quiet, vitreous strands wicking to SN cataract wound (1030), No cell or flare   Iris round and poorly dilated to 4.0, Transillumination defects  from 0130-0330 round and poorly dilated to 3.5, faint peripheral TID from 0245-0400   Lens PC IOL in good position with  open PC PC IOL in good position   Anterior Vitreous Vitreous syneresis Vitreous syneresis, Posterior vitreous detachment, vitreous condensations         Fundus Exam       Right Left   Disc Pink and Sharp Pink and Sharp   C/D Ratio 0.1 0.3   Macula Flat, Blunted foveal reflex, Drusen, RPE mottling, no heme or edema Flat, Blunted foveal reflex, Drusen, RPE mottling and clumping, focal pigment clump IN to fovea, No heme or edema   Vessels mild attenuation, mild tortuosity attenuated, mild AV crossing changes   Periphery Attached, reticular degeneration, No RT/RD Attached, retinal tear with pigmented laser scars surrounding at 0300, reticular degeneration, no new RT/RD           Refraction     Wearing Rx       Sphere Cylinder Axis Add   Right -1.75 +1.75 168 +2.50   Left -0.50 +0.75 024 +2.50           IMAGING AND PROCEDURES  Imaging and Procedures for 10/17/2022  OCT, Retina - OU - Both Eyes       Right Eye Quality was good. Central Foveal Thickness: 244. Progression has been stable. Findings include normal foveal contour, no IRF, no SRF, retinal drusen .   Left Eye Quality was good. Central Foveal Thickness: 244. Progression has been stable. Findings include normal foveal contour, no IRF, no SRF, retinal drusen (Focal prominent druse IN to fovea--stable).   Notes *Images captured and stored on drive  Diagnosis / Impression:  NFP, no IRF/SRF OU Mild drusen OU -- stable OS: Focal prominent druse IN to fovea--stable  Clinical management:  See below  Abbreviations: NFP - Normal foveal profile. CME - cystoid macular edema. PED - pigment epithelial detachment. IRF - intraretinal fluid. SRF - subretinal fluid. EZ - ellipsoid zone. ERM - epiretinal membrane. ORA - outer retinal atrophy. ORT - outer retinal tubulation. SRHM - subretinal hyper-reflective  material. IRHM - intraretinal hyper-reflective material            ASSESSMENT/PLAN:    ICD-10-CM   1. Intermediate stage nonexudative age-related macular degeneration of both eyes  H35.3132 OCT, Retina - OU - Both Eyes    2. History of repair of retinal tear by laser photocoagulation  Z98.890     3. Essential hypertension  I10     4. Hypertensive retinopathy of both eyes  H35.033     5. Pseudophakia, both eyes  Z96.1      Age related macular degeneration, non-exudative, both eyes  - intermediate stage w/ mild drusen OU  - BCVA 20/20 OU -- stable  - OS w/ prominent focal druse IN to fovea -- stable on OCT  - FA 6.5.23 w/o leakage or CNV OU  - The incidence, anatomy, and pathology of dry AMD, risk of progression, and the AREDS and AREDS 2 study including smoking risks discussed with patient.  - Recommend amsler grid monitoring  - f/u 9 months, DFE, OCT  2. History of retinal tear s/p laser retinopexy OS  - retinal tear at 0300 with good laser in place (JDM)  - no new RT/RD  3,4. Hypertensive retinopathy OU - discussed importance of tight BP control - monitor  5. Pseudophakia OU  - s/p CE/IOL (Dr. Dolores Lory) - doing well  - monitor  Ophthalmic Meds Ordered this visit:  No orders of the defined types were placed in this encounter.    Return in about 9 months (around 07/18/2023) for  f/u non-exu ARMD OU, DFE, OCT.  There are no Patient Instructions on file for this visit.  Explained the diagnoses, plan, and follow up with the patient and they expressed understanding.  Patient expressed understanding of the importance of proper follow up care. .  This document serves as a record of services personally performed by Gardiner Sleeper, MD, PhD. It was created on their behalf by San Jetty. Owens Shark, OA an ophthalmic technician. The creation of this record is the provider's dictation and/or activities during the visit.    Electronically signed by: San Jetty. Marguerita Merles 01.11.2024 12:32  PM  Gardiner Sleeper, M.D., Ph.D. Diseases & Surgery of the Retina and Vitreous [facility]  I have reviewed the above documentation for accuracy and completeness, and I agree with the above. Gardiner Sleeper, M.D., Ph.D. 10/17/22 12:33 PM   Abbreviations: M myopia (nearsighted); A astigmatism; H hyperopia (farsighted); P presbyopia; Mrx spectacle prescription;  CTL contact lenses; OD right eye; OS left eye; OU both eyes  XT exotropia; ET esotropia; PEK punctate epithelial keratitis; PEE punctate epithelial erosions; DES dry eye syndrome; MGD meibomian gland dysfunction; ATs artificial tears; PFAT's preservative free artificial tears; Casar nuclear sclerotic cataract; PSC posterior subcapsular cataract; ERM epi-retinal membrane; PVD posterior vitreous detachment; RD retinal detachment; DM diabetes mellitus; DR diabetic retinopathy; NPDR non-proliferative diabetic retinopathy; PDR proliferative diabetic retinopathy; CSME clinically significant macular edema; DME diabetic macular edema; dbh dot blot hemorrhages; CWS cotton wool spot; POAG primary open angle glaucoma; C/D cup-to-disc ratio; HVF humphrey visual field; GVF goldmann visual field; OCT optical coherence tomography; IOP intraocular pressure; BRVO Branch retinal vein occlusion; CRVO central retinal vein occlusion; CRAO central retinal artery occlusion; BRAO branch retinal artery occlusion; RT retinal tear; SB scleral buckle; PPV pars plana vitrectomy; VH Vitreous hemorrhage; PRP panretinal laser photocoagulation; IVK intravitreal kenalog; VMT vitreomacular traction; MH Macular hole;  NVD neovascularization of the disc; NVE neovascularization elsewhere; AREDS age related eye disease study; ARMD age related macular degeneration; POAG primary open angle glaucoma; EBMD epithelial/anterior basement membrane dystrophy; ACIOL anterior chamber intraocular lens; IOL intraocular lens; PCIOL posterior chamber intraocular lens; Phaco/IOL  phacoemulsification with intraocular lens placement; [place] photorefractive keratectomy; LASIK laser assisted in situ keratomileusis; HTN hypertension; DM diabetes mellitus; COPD chronic obstructive pulmonary disease

## 2022-10-17 ENCOUNTER — Encounter (INDEPENDENT_AMBULATORY_CARE_PROVIDER_SITE_OTHER): Payer: Self-pay | Admitting: Ophthalmology

## 2022-10-17 ENCOUNTER — Ambulatory Visit (INDEPENDENT_AMBULATORY_CARE_PROVIDER_SITE_OTHER): Payer: Medicare Other | Admitting: Ophthalmology

## 2022-10-17 DIAGNOSIS — H35033 Hypertensive retinopathy, bilateral: Secondary | ICD-10-CM

## 2022-10-17 DIAGNOSIS — Z961 Presence of intraocular lens: Secondary | ICD-10-CM

## 2022-10-17 DIAGNOSIS — H353132 Nonexudative age-related macular degeneration, bilateral, intermediate dry stage: Secondary | ICD-10-CM

## 2022-10-17 DIAGNOSIS — Z9889 Other specified postprocedural states: Secondary | ICD-10-CM

## 2022-10-17 DIAGNOSIS — I10 Essential (primary) hypertension: Secondary | ICD-10-CM | POA: Diagnosis not present

## 2022-10-19 NOTE — Progress Notes (Signed)
Joshua Strong - 89 y.o. male MRN [id number]  Date of birth: [birth year]  Office Visit Note: Visit Date: 10/03/2022 PCP: [name], MD Referred by: Sasser, Silvestre Moment, MD  Subjective: Chief Complaint  Patient presents with   Lower Back - Pain   HPI:  Joshua Strong is a 89 y.o. male who comes in today for planned repeat Bilateral L4-5  Lumbar Transforaminal epidural steroid injection with fluoroscopic guidance.  The patient has failed conservative care including home exercise, medications, time and activity modification.  This injection will be diagnostic and hopefully therapeutic.  Please see requesting physician notes for further details and justification. Patient received more than 50% pain relief from prior injection.   Referring: Barnet Pall, FNP   ROS Otherwise per HPI.  Assessment & Plan: Visit Diagnoses:    ICD-10-CM   1. Lumbar radiculopathy  M54.16 XR C-ARM NO REPORT    Epidural Steroid injection    methylPREDNISolone acetate (DEPO-MEDROL) injection 80 mg      Plan: No additional findings.   Meds & Orders:  Meds ordered this encounter  Medications   methylPREDNISolone acetate (DEPO-MEDROL) injection 80 mg    Orders Placed This Encounter  Procedures   XR C-ARM NO REPORT   Epidural Steroid injection    Follow-up: No follow-ups on file.   Procedures: No procedures performed  Lumbosacral Transforaminal Epidural Steroid Injection - Sub-Pedicular Approach with Fluoroscopic Guidance  Patient: Joshua Strong      Date of Birth: 1931-09-09 MRN: [id number] PCP: [name], MD      Visit Date: 10/03/2022   Universal Protocol:    Date/Time: 10/03/2022  Consent Given By: the patient  Position: PRONE  Additional Comments: Vital signs were monitored before and after the procedure. Patient was prepped and draped in the usual sterile fashion. The correct patient, procedure, and site was verified.   Injection Procedure Details:   Procedure diagnoses: Lumbar  radiculopathy [M54.16]    Meds Administered:  Meds ordered this encounter  Medications   methylPREDNISolone acetate (DEPO-MEDROL) injection 80 mg    Laterality: Bilateral  Location/Site: L4  Needle:5.0 in., 22 ga.  Short bevel or Quincke spinal needle  Needle Placement: Transforaminal  Findings:    -Comments: Excellent flow of contrast along the nerve, nerve root and into the epidural space.  Procedure Details: After squaring off the end-plates to get a true AP view, the C-arm was positioned so that an oblique view of the foramen as noted above was visualized. The target area is just inferior to the "nose of the scotty dog" or sub pedicular. The soft tissues overlying this structure were infiltrated with 2-3 ml. of 1% Lidocaine without Epinephrine.  The spinal needle was inserted toward the target using a "trajectory" view along the fluoroscope beam.  Under AP and lateral visualization, the needle was advanced so it did not puncture dura and was located close the 6 O'Clock position of the pedical in AP tracterory. Biplanar projections were used to confirm position. Aspiration was confirmed to be negative for CSF and/or blood. A 1-2 ml. volume of Isovue-250 was injected and flow of contrast was noted at each level. Radiographs were obtained for documentation purposes.   After attaining the desired flow of contrast documented above, a 0.5 to 1.0 ml test dose of 0.25% Marcaine was injected into each respective transforaminal space.  The patient was observed for 90 seconds post injection.  After no sensory deficits were reported, and normal lower extremity motor function  was noted,   the above injectate was administered so that equal amounts of the injectate were placed at each foramen (level) into the transforaminal epidural space.   Additional Comments:  No complications occurred Dressing: 2 x 2 sterile gauze and Band-Aid    Post-procedure details: Patient was observed during the  procedure. Post-procedure instructions were reviewed.  Patient left the clinic in stable condition.    Clinical History: No specialty comments available.     Objective:  VS:  HT:    WT:   BMI:     BP:   HR: bpm  TEMP: ( )  RESP:  Physical Exam Vitals and nursing note reviewed.  Constitutional:      General: He is not in acute distress.    Appearance: Normal appearance. He is not ill-appearing.  HENT:     Head: Normocephalic and atraumatic.     Right Ear: External ear normal.     Left Ear: External ear normal.     Nose: No congestion.  Eyes:     Extraocular Movements: Extraocular movements intact.  Cardiovascular:     Rate and Rhythm: Normal rate.     Pulses: Normal pulses.  Pulmonary:     Effort: Pulmonary effort is normal. No respiratory distress.  Abdominal:     General: There is no distension.     Palpations: Abdomen is soft.  Musculoskeletal:        General: No tenderness or signs of injury.     Cervical back: Neck supple.     Right lower leg: No edema.     Left lower leg: No edema.     Comments: Patient has good distal strength without clonus.  Skin:    Findings: No erythema or rash.  Neurological:     General: No focal deficit present.     Mental Status: He is alert and oriented to person, place, and time.     Sensory: No sensory deficit.     Motor: No weakness or abnormal muscle tone.     Coordination: Coordination normal.  Psychiatric:        Mood and Affect: Mood normal.        Behavior: Behavior normal.      Imaging: No results found.

## 2022-10-19 NOTE — Procedures (Signed)
Lumbosacral Transforaminal Epidural Steroid Injection - Sub-Pedicular Approach with Fluoroscopic Guidance  Patient: Joshua Strong      Date of Birth: Jun 12, 1931 MRN: [id number] PCP: [name], MD      Visit Date: 10/03/2022   Universal Protocol:    Date/Time: 10/03/2022  Consent Given By: the patient  Position: PRONE  Additional Comments: Vital signs were monitored before and after the procedure. Patient was prepped and draped in the usual sterile fashion. The correct patient, procedure, and site was verified.   Injection Procedure Details:   Procedure diagnoses: Lumbar radiculopathy [M54.16]    Meds Administered:  Meds ordered this encounter  Medications   methylPREDNISolone acetate (DEPO-MEDROL) injection 80 mg    Laterality: Bilateral  Location/Site: L4  Needle:5.0 in., 22 ga.  Short bevel or Quincke spinal needle  Needle Placement: Transforaminal  Findings:    -Comments: Excellent flow of contrast along the nerve, nerve root and into the epidural space.  Procedure Details: After squaring off the end-plates to get a true AP view, the C-arm was positioned so that an oblique view of the foramen as noted above was visualized. The target area is just inferior to the "nose of the scotty dog" or sub pedicular. The soft tissues overlying this structure were infiltrated with 2-3 ml. of 1% Lidocaine without Epinephrine.  The spinal needle was inserted toward the target using a "trajectory" view along the fluoroscope beam.  Under AP and lateral visualization, the needle was advanced so it did not puncture dura and was located close the 6 O'Clock position of the pedical in AP tracterory. Biplanar projections were used to confirm position. Aspiration was confirmed to be negative for CSF and/or blood. A 1-2 ml. volume of Isovue-250 was injected and flow of contrast was noted at each level. Radiographs were obtained for documentation purposes.   After attaining the desired flow  of contrast documented above, a 0.5 to 1.0 ml test dose of 0.25% Marcaine was injected into each respective transforaminal space.  The patient was observed for 90 seconds post injection.  After no sensory deficits were reported, and normal lower extremity motor function was noted,   the above injectate was administered so that equal amounts of the injectate were placed at each foramen (level) into the transforaminal epidural space.   Additional Comments:  No complications occurred Dressing: 2 x 2 sterile gauze and Band-Aid    Post-procedure details: Patient was observed during the procedure. Post-procedure instructions were reviewed.  Patient left the clinic in stable condition.

## 2022-11-22 ENCOUNTER — Ambulatory Visit: Payer: Medicare Other | Attending: Internal Medicine | Admitting: Internal Medicine

## 2022-11-22 ENCOUNTER — Encounter: Payer: Self-pay | Admitting: Internal Medicine

## 2022-11-22 VITALS — BP 152/72 | HR 76 | Ht 66.0 in | Wt 154.8 lb

## 2022-11-22 DIAGNOSIS — R079 Chest pain, unspecified: Secondary | ICD-10-CM | POA: Insufficient documentation

## 2022-11-22 DIAGNOSIS — R55 Syncope and collapse: Secondary | ICD-10-CM | POA: Diagnosis not present

## 2022-11-22 NOTE — Patient Instructions (Signed)
Medication Instructions:  No Changes In Medications at this time.  *If you need a refill on your cardiac medications before your next appointment, please call your pharmacy*  Follow-Up: At [facility], you and your health needs are our priority.  As part of our continuing mission to provide you with exceptional heart care, we have created designated Provider Care Teams.  These Care Teams include your primary Cardiologist (physician) and Advanced Practice Providers (APPs -  Physician Assistants and Nurse Practitioners) who all work together to provide you with the care you need, when you need it.  Your next appointment:   6 month(s)  Provider:   Elouise Munroe, MD     [place]

## 2022-11-23 NOTE — Progress Notes (Signed)
Cardiology Office Note:    Date:  11/22/2022   ID:  Joshua Strong, DOB 1931/05/15, MRN [id number]  PCP:  [name], MD  Cardiologist:  Elouise Munroe, MD  Electrophysiologist:  None   Referring MD: [name], MD   Chief Complaint/Reason for Referral: Syncope  History of Present Illness:    Joshua Strong is a 89 y.o. male with no significant past cardiovascular history, GERD, renal cell carcinoma, who presents for evaluation of syncope.   11/22/22:  Had another syncopal episode since our last visit. Again climbing on a ladder reaching to a shelf. Recently moved, fell into his moving boxes. No injuries. On occasionally wearing his compression socks which he stated helped previously. Has been seen by neurology, not felt to have primary neurologic issue. Orthostatic VS noted. He brings a BP log to review. BP generally in 130s-150s/70s. We discussed at great length my concerns with treating BP of [id number] systolic in setting of known orthostatic syncope. A bit of permissive BP would be advisable, he agrees. If BP consistently >160-170 we will discuss again. Continue to keep his log. Shared decision making - he and his son in law agree. Rare right sided chest pain, no other concerns. Married for 70 years, discussed that marital stressors do raise his BP. Discussed stress management strategies and obtaining home help given his age.   07/15/22: no recurrent syncope. Feeling well overall. We do not have monitor results to review however no notifications during event monitor period. Here today with his son. Wearing compression socks and thinks that has helped a lot. Echo was unremarkable for source of syncope. Does have moderate MAC, no MS.   Initial consult: Syncope has occurred x 2. Has had this evaluated by cardiology previously, echocardiogram grossly normal in May 2020.   This episode he was working in the garage and fell backward and woke up on the ground. Unclear time of LOC.  Has bee  noticing dizzy spells, will get dizzy in the shower or getting of bed. He will also however note it while sitting at the table.  EKG with RBBB and first deg avb, some conduction system disease.  He has also been having one month of CP and SOB. Not entirely clear if symptoms are related or independent of driver of syncope.   The patient denies palpitations, PND, orthopnea, or leg swelling. Denies cough, fever, chills. Denies nausea, vomiting.  Past Medical History:  Diagnosis Date   Arthritis    Dizziness    GERD (gastroesophageal reflux disease)    GI bleed    ulcer   Hemorrhoid    Memory loss    Pancreatitis    Pneumonia    Renal cell carcinoma ([place])    Syncope    Tremor    Tubular adenoma 2013    Past Surgical History:  Procedure Laterality Date   [place]   COLONOSCOPY  06/16/2007   Dr. Gala Romney- minimal internal hemorrhoids o/w normal rectum,tubular adenoma   COLONOSCOPY  07/02/2012   Dr.Rourk- normal rectum, 3 5-7 mm peduncuated polyos, one in the mid sigmoid segment and 2 in the ascending segment. remainder of the colonic mucosa appeared normal. bx= tubular adenoma   COLONOSCOPY N/A 07/17/2015   Procedure: COLONOSCOPY;  Surgeon: Daneil Dolin, MD;  Location: [facility];  Service: Endoscopy;  Laterality: N/A;  730    EYE SURGERY  cataract with lens implants   HAND SURGERY Right [place] Right 2000   NEPHRECTOMY Right 2000   pancreatic duct stent     mult times   SHOULDER SURGERY Left 09/16/2011   SPINAL FUSION     testicular tumor  1989   TOTAL KNEE ARTHROPLASTY Right 05/24/2016   Procedure: RIGHT TOTAL KNEE ARTHROPLASTY;  Surgeon: Mcarthur Rossetti, MD;  Location: [facility];  Service: Orthopedics;  Laterality: Right;    Current Medications: Current Meds  Medication Sig   acetaminophen (TYLENOL) 650 MG CR tablet Take 650 mg by mouth every 8 (eight) hours as needed for  pain.   Calcium Carbonate (CALTRATE 600 PO) Take 1 tablet by mouth daily.   denosumab (PROLIA) 60 MG/ML SOSY injection Inject 60 mg into the skin every 6 (six) months.   fluticasone (FLONASE) 50 MCG/ACT nasal spray Place 1 spray into both nostrils daily as needed for allergies or rhinitis.   meclizine (ANTIVERT) 12.5 MG tablet Take 12.5 mg by mouth 3 (three) times daily as needed for dizziness.   Multiple Vitamin (MULTIVITAMIN WITH MINERALS) TABS tablet Take 1 tablet by mouth daily.   OVER THE COUNTER MEDICATION Super Beta Prostate twice daily   pantoprazole (PROTONIX) 40 MG tablet Take 40 mg by mouth 2 (two) times daily.   Polyethyl Glycol-Propyl Glycol (SYSTANE FREE OP) Apply 1-2 drops to eye daily. Both eyes     Allergies:   Morphine and related and Metoclopramide hcl   Social History   Tobacco Use   Smoking status: Former    Packs/day: 1.00    Years: 3.00    Total pack years: 3.00    Types: Cigarettes    Quit date: 05/17/1956    Years since quitting: 66.5   Smokeless tobacco: Never   Tobacco comments:    quit in [place] Use   Vaping Use: Never used  Substance Use Topics   Alcohol use: No    Comment: 08/11/19 "too much for 6 yrs", quit in 1958   Drug use: No     Family History: The patient's family history includes Cancer in his mother. He was adopted.  ROS:   Please see the history of present illness.    All other systems reviewed and are negative.  EKGs/Labs/Other Studies Reviewed:    The following studies were reviewed today:  EKG:  SR 1st deg AVB, RBBB QRS 140 ms  Imaging studies that I have independently reviewed today: n/a  Recent Labs: 08/24/2022: ALT 22; Hemoglobin 15.8; Magnesium 2.4; Platelets 227; TSH 1.329 10/01/2022: BUN 28; Creatinine, Ser 1.54; Potassium 5.2; Sodium 135  Recent Lipid Panel No results found for: "CHOL", "TRIG", "HDL", "CHOLHDL", "VLDL", "LDLCALC", "LDLDIRECT"  Physical Exam:    VS:  BP (!) 152/72   Pulse 76   Ht '5\' 6"'$   (1.676 m)   Wt 154 lb 12.8 oz (70.2 kg)   SpO2 98%   BMI 24.99 kg/m     BP 113/62  height is '5\' 6"'$  (1.676 m) and weight is 154 lb 12.8 oz (70.2 kg). His blood pressure is 152/72 (abnormal) and his pulse is 76. His oxygen saturation is 98%.    Wt Readings from Last 5 Encounters:  11/22/22 154 lb 12.8 oz (70.2 kg)  10/01/22 149 lb (67.6 kg)  08/24/22 140 lb (63.5 kg)  07/15/22 150 lb 9.6 oz (68.3 kg)  06/06/22 148 lb 9.6 oz (67.4 kg)    Constitutional: No acute distress Eyes: sclera  non-icteric, normal conjunctiva and lids ENMT: normal dentition, moist mucous membranes Cardiovascular: regular rhythm, normal rate, no murmur. S1 and S2 normal. No jugular venous distention.  Respiratory: clear to auscultation bilaterally GI : normal bowel sounds, soft and nontender. No distention.   MSK: extremities warm, well perfused. No edema.  NEURO: grossly nonfocal exam, moves all extremities. PSYCH: alert and oriented x 3, normal mood and affect.   ASSESSMENT:    1. Syncope, unspecified syncope type   2. Chest pain of uncertain etiology      PLAN:    Syncope, unspecified syncope type  Chest pain of uncertain etiology  Participated in shared decision making, son in law present who provides collaborative history.  Monitor - IVCD and mild bradycardia but symptoms are positional. Most likely orthostatic hypotension mediated (neurocardiogenic). Echo stable. No concern for chest pain at this time. If recurrent, can pursue stress testing.  Liberalize salt, hydrate well and wear compression socks -this helped the most.  Elevated BP - not of major concern in setting of orthostasis, low BP likely the more dangerous of the two conditions. Will alert [place] if BP consistently severely elevated in 160-170 or greater range. PT in agreement with plan.    Total time of encounter: 40 minutes total time of encounter, including 30 minutes spent in face-to-face patient care on the date of this encounter.  This time includes coordination of care and counseling regarding above mentioned problem list. Remainder of non-face-to-face time involved reviewing chart documents/testing relevant to the patient encounter and documentation in the medical record. I have independently reviewed documentation from referring provider.   Cherlynn Kaiser, MD, [place]    Shared Decision Making/Informed Consent:       Medication Adjustments/Labs and Tests Ordered: Current medicines are reviewed at length with the patient today.  Concerns regarding medicines are outlined above.   No orders of the defined types were placed in this encounter.   No orders of the defined types were placed in this encounter.   Patient Instructions  Medication Instructions:  No Changes In Medications at this time.  *If you need a refill on your cardiac medications before your next appointment, please call your pharmacy*  Follow-Up: At [facility], you and your health needs are our priority.  As part of our continuing mission to provide you with exceptional heart care, we have created designated Provider Care Teams.  These Care Teams include your primary Cardiologist (physician) and Advanced Practice Providers (APPs -  Physician Assistants and Nurse Practitioners) who all work together to provide you with the care you need, when you need it.  Your next appointment:   6 month(s)  Provider:   Elouise Munroe, MD     [place]

## 2022-12-04 ENCOUNTER — Telehealth: Payer: Self-pay | Admitting: Physical Medicine and Rehabilitation

## 2022-12-04 DIAGNOSIS — M48062 Spinal stenosis, lumbar region with neurogenic claudication: Secondary | ICD-10-CM

## 2022-12-04 DIAGNOSIS — M5416 Radiculopathy, lumbar region: Secondary | ICD-10-CM

## 2022-12-04 DIAGNOSIS — M47816 Spondylosis without myelopathy or radiculopathy, lumbar region: Secondary | ICD-10-CM

## 2022-12-04 MED ORDER — PREDNISONE 10 MG PO TABS: 50.0000 mg | ORAL_TABLET | Freq: Every day | ORAL | 0 refills | Status: DC

## 2022-12-04 NOTE — Telephone Encounter (Signed)
Patient called asked for a call back. Patient said he had an injection and it didn't help at all and he can hardly walk.   The number to contact patient is [phone]

## 2022-12-05 NOTE — Telephone Encounter (Signed)
Spoke with patient and informed him of the information below. Verbalized understanding. He stated he feels a little better today

## 2022-12-13 ENCOUNTER — Telehealth: Payer: Self-pay | Admitting: Physical Medicine and Rehabilitation

## 2022-12-13 ENCOUNTER — Other Ambulatory Visit: Payer: Self-pay | Admitting: Physical Medicine and Rehabilitation

## 2022-12-13 MED ORDER — DIAZEPAM 5 MG PO TABS: ORAL_TABLET | ORAL | 0 refills | Status: AC

## 2022-12-13 NOTE — Telephone Encounter (Signed)
Pt called back requesting for [place] to ask Dr Ernestina Patches for some type of pain reliever for pain when he get injection. Please call pt about this matter. Pt states he forgot to tell [place] when making his appt. Please send to pharmacy on file. Pt phone number is [phone].

## 2022-12-16 ENCOUNTER — Encounter: Payer: Medicare Other | Admitting: Physical Medicine and Rehabilitation

## 2022-12-16 DIAGNOSIS — M79672 Pain in left foot: Secondary | ICD-10-CM | POA: Diagnosis not present

## 2022-12-16 DIAGNOSIS — L110 Acquired keratosis follicularis: Secondary | ICD-10-CM | POA: Diagnosis not present

## 2022-12-16 DIAGNOSIS — M79674 Pain in right toe(s): Secondary | ICD-10-CM | POA: Diagnosis not present

## 2022-12-16 DIAGNOSIS — M79675 Pain in left toe(s): Secondary | ICD-10-CM | POA: Diagnosis not present

## 2022-12-16 DIAGNOSIS — I739 Peripheral vascular disease, unspecified: Secondary | ICD-10-CM | POA: Diagnosis not present

## 2022-12-16 DIAGNOSIS — M79671 Pain in right foot: Secondary | ICD-10-CM | POA: Diagnosis not present

## 2022-12-19 ENCOUNTER — Ambulatory Visit (INDEPENDENT_AMBULATORY_CARE_PROVIDER_SITE_OTHER): Payer: Medicare Other | Admitting: Physical Medicine and Rehabilitation

## 2022-12-19 ENCOUNTER — Other Ambulatory Visit: Payer: Self-pay

## 2022-12-19 VITALS — BP 180/76 | HR 65

## 2022-12-19 DIAGNOSIS — M5416 Radiculopathy, lumbar region: Secondary | ICD-10-CM | POA: Diagnosis not present

## 2022-12-19 DIAGNOSIS — G119 Hereditary ataxia, unspecified: Secondary | ICD-10-CM | POA: Diagnosis not present

## 2022-12-19 DIAGNOSIS — M48062 Spinal stenosis, lumbar region with neurogenic claudication: Secondary | ICD-10-CM

## 2022-12-19 DIAGNOSIS — M47816 Spondylosis without myelopathy or radiculopathy, lumbar region: Secondary | ICD-10-CM | POA: Diagnosis not present

## 2022-12-19 DIAGNOSIS — R202 Paresthesia of skin: Secondary | ICD-10-CM | POA: Diagnosis not present

## 2022-12-19 MED ORDER — METHYLPREDNISOLONE ACETATE 80 MG/ML IJ SUSP
80.0000 mg | Freq: Once | INTRAMUSCULAR | Status: AC
  Administered 2022-12-19: 80 mg

## 2022-12-19 NOTE — Patient Instructions (Signed)

## 2022-12-19 NOTE — Progress Notes (Signed)
Functional Pain Scale - descriptive words and definitions  Distressing (6)    Pain is present/unable to complete most ADLs limited by pain/sleep is difficult and active distraction is only marginal. Moderate range order  Average Pain  varies   +Driver, -BT, -Dye Allergies.  Lower back pain on both sides, unsure if it is radiating in the legs

## 2022-12-22 ENCOUNTER — Ambulatory Visit
Admission: RE | Admit: 2022-12-22 | Discharge: 2022-12-22 | Disposition: A | Discharge status: Home / Self Care | Payer: Medicare Other | Source: Ambulatory Visit | Attending: Physical Medicine and Rehabilitation | Admitting: Physical Medicine and Rehabilitation

## 2022-12-22 DIAGNOSIS — M48062 Spinal stenosis, lumbar region with neurogenic claudication: Secondary | ICD-10-CM

## 2022-12-22 DIAGNOSIS — M47816 Spondylosis without myelopathy or radiculopathy, lumbar region: Secondary | ICD-10-CM

## 2022-12-22 DIAGNOSIS — M48061 Spinal stenosis, lumbar region without neurogenic claudication: Secondary | ICD-10-CM | POA: Diagnosis not present

## 2022-12-22 DIAGNOSIS — M545 Low back pain, unspecified: Secondary | ICD-10-CM | POA: Diagnosis not present

## 2022-12-22 DIAGNOSIS — M5416 Radiculopathy, lumbar region: Secondary | ICD-10-CM

## 2022-12-25 DIAGNOSIS — D239 Other benign neoplasm of skin, unspecified: Secondary | ICD-10-CM | POA: Diagnosis not present

## 2022-12-25 DIAGNOSIS — Z85828 Personal history of other malignant neoplasm of skin: Secondary | ICD-10-CM | POA: Diagnosis not present

## 2022-12-25 DIAGNOSIS — Z85820 Personal history of malignant melanoma of skin: Secondary | ICD-10-CM | POA: Diagnosis not present

## 2022-12-26 ENCOUNTER — Telehealth: Payer: Self-pay | Admitting: Physical Medicine and Rehabilitation

## 2022-12-26 NOTE — Telephone Encounter (Signed)
I called patient this morning to discuss recent lumbar MRI results. Minimal changes from imaging in 2021. There is L4-L5 bilateral subarticular recess stenosis and L5 impingement. Patient has no questions at this time.

## 2023-01-01 NOTE — Procedures (Signed)
Lumbosacral Transforaminal Epidural Steroid Injection - Sub-Pedicular Approach with Fluoroscopic Guidance  Patient: Joshua Strong      Date of Birth: Feb 05, 1931 MRN: [id number] PCP: [name], MD      Visit Date: 12/19/2022   Universal Protocol:    Date/Time: 12/19/2022  Consent Given By: the patient  Position: PRONE  Additional Comments: Vital signs were monitored before and after the procedure. Patient was prepped and draped in the usual sterile fashion. The correct patient, procedure, and site was verified.   Injection Procedure Details:   Procedure diagnoses: Lumbar radiculopathy [M54.16]    Meds Administered:  Meds ordered this encounter  Medications   methylPREDNISolone acetate (DEPO-MEDROL) injection 80 mg    Laterality: Bilateral  Location/Site: L4  Needle:5.0 in., 22 ga.  Short bevel or Quincke spinal needle  Needle Placement: Transforaminal  Findings:    -Comments: Excellent flow of contrast along the nerve, nerve root and into the epidural space.  Procedure Details: After squaring off the end-plates to get a true AP view, the C-arm was positioned so that an oblique view of the foramen as noted above was visualized. The target area is just inferior to the "nose of the scotty dog" or sub pedicular. The soft tissues overlying this structure were infiltrated with 2-3 ml. of 1% Lidocaine without Epinephrine.  The spinal needle was inserted toward the target using a "trajectory" view along the fluoroscope beam.  Under AP and lateral visualization, the needle was advanced so it did not puncture dura and was located close the 6 O'Clock position of the pedical in AP tracterory. Biplanar projections were used to confirm position. Aspiration was confirmed to be negative for CSF and/or blood. A 1-2 ml. volume of Isovue-250 was injected and flow of contrast was noted at each level. Radiographs were obtained for documentation purposes.   After attaining the desired flow  of contrast documented above, a 0.5 to 1.0 ml test dose of 0.25% Marcaine was injected into each respective transforaminal space.  The patient was observed for 90 seconds post injection.  After no sensory deficits were reported, and normal lower extremity motor function was noted,   the above injectate was administered so that equal amounts of the injectate were placed at each foramen (level) into the transforaminal epidural space.   Additional Comments:  The patient tolerated the procedure well Dressing: 2 x 2 sterile gauze and Band-Aid    Post-procedure details: Patient was observed during the procedure. Post-procedure instructions were reviewed.  Patient left the clinic in stable condition.

## 2023-01-01 NOTE — Progress Notes (Signed)
Joshua Strong - 89 y.o. male MRN [id number]  Date of birth: [birth year]  Office Visit Note: Visit Date: 12/19/2022 PCP: [name], MD Referred by: Sasser, Silvestre Moment, MD  Subjective: Chief Complaint  Patient presents with   Lower Back - Pain   HPI: Joshua Strong is a 89 y.o. male who comes in today for evaluation and management of chronic severe recalcitrant low back pain with at times bilateral radicular leg pain.  Patient is somewhat of a poor historian we have been seeing him for many years.  He has a friend with him usually that helps with some of the history.  We have had serial imaging of the patient's lumbar spine and this is gone over with him in the past.  We do have an MRI pending.  We last saw him in January completed bilateral L4 transforaminal epidural steroid injection.  His left side is very difficult to inject due to foraminal stenosis and listhesis.  He reports today he did get more relief on the right than the left.  He reports that he did not feel like the injection seem to last as long as it did before particularly on that 1 side.  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  His balance issues is not coming from his spine.  He does not have high-grade stenosis and this usually does not cause that anyway in the lower lumbar area.  He does have a history of what he feels like his polyneuropathy.  Does not have diabetes.  Complicated by history of renal cell carcinoma.  He continues to feel weakness in his legs in general and weak overall.  Discussed home exercises with him again and he has not been to physical therapy on several occasions and tries to continue with home exercise program.  He denies any focal weakness like foot drop etc.  No bowel or bladder changes no unintended weight loss etc.  Main complaint is back pain to the hips really unsure if it is going down his legs at this point  but does have paresthesias in both legs.  Continues with Tylenol.  He does not tolerate opioid medications.  He has tried gabapentin in the past but it does make him dizzy.      Review of Systems  Musculoskeletal:  Positive for back pain, falls and joint pain.  Neurological:  Positive for dizziness and tingling.  All other systems reviewed and are negative.  Otherwise per HPI.  Assessment & Plan: Visit Diagnoses:    ICD-10-CM   1. Lumbar radiculopathy  M54.16 XR C-ARM NO REPORT    Epidural Steroid injection    methylPREDNISolone acetate (DEPO-MEDROL) injection 80 mg    2. Spinal stenosis of lumbar region with neurogenic claudication  M48.062     3. Spondylosis without myelopathy or radiculopathy, lumbar region  M47.816     4. Paresthesia of skin  R20.2     5. Cerebellar ataxia (HCC)  G11.9        Plan: Findings:  Complicated chronic pain syndrome with chronic back pain with history of prior lumbar laminectomy in the lower level but with stenosis at L4-5 with MRI pending to see if this is worsened.  History of cerebellar ataxia and continued complaints of balance difficulties.  Really long discussion today with him greater than 30 minutes on his condition.  Do find it difficult lately  to get the injection on the left side and we will try that today first and just see if we can really get the medication into that area of tightness on the left side.  Depending on relief we will review MRI when it is done and try to make plans accordingly after that.  Discussed home exercise program with at length today.    Meds & Orders:  Meds ordered this encounter  Medications   methylPREDNISolone acetate (DEPO-MEDROL) injection 80 mg    Orders Placed This Encounter  Procedures   XR C-ARM NO REPORT   Epidural Steroid injection    Follow-up: Return if symptoms worsen or fail to improve.   Procedures: No procedures performed  Lumbosacral Transforaminal Epidural Steroid Injection -  Sub-Pedicular Approach with Fluoroscopic Guidance  Patient: Joshua Strong      Date of Birth: 1930-10-19 MRN: [id number] PCP: [name], MD      Visit Date: 12/19/2022   Universal Protocol:    Date/Time: 12/19/2022  Consent Given By: the patient  Position: PRONE  Additional Comments: Vital signs were monitored before and after the procedure. Patient was prepped and draped in the usual sterile fashion. The correct patient, procedure, and site was verified.   Injection Procedure Details:   Procedure diagnoses: Lumbar radiculopathy [M54.16]    Meds Administered:  Meds ordered this encounter  Medications   methylPREDNISolone acetate (DEPO-MEDROL) injection 80 mg    Laterality: Bilateral  Location/Site: L4  Needle:5.0 in., 22 ga.  Short bevel or Quincke spinal needle  Needle Placement: Transforaminal  Findings:    -Comments: Excellent flow of contrast along the nerve, nerve root and into the epidural space.  Procedure Details: After squaring off the end-plates to get a true AP view, the C-arm was positioned so that an oblique view of the foramen as noted above was visualized. The target area is just inferior to the "nose of the scotty dog" or sub pedicular. The soft tissues overlying this structure were infiltrated with 2-3 ml. of 1% Lidocaine without Epinephrine.  The spinal needle was inserted toward the target using a "trajectory" view along the fluoroscope beam.  Under AP and lateral visualization, the needle was advanced so it did not puncture dura and was located close the 6 O'Clock position of the pedical in AP tracterory. Biplanar projections were used to confirm position. Aspiration was confirmed to be negative for CSF and/or blood. A 1-2 ml. volume of Isovue-250 was injected and flow of contrast was noted at each level. Radiographs were obtained for documentation purposes.   After attaining the desired flow of contrast documented above, a 0.5 to 1.0 ml test dose  of 0.25% Marcaine was injected into each respective transforaminal space.  The patient was observed for 90 seconds post injection.  After no sensory deficits were reported, and normal lower extremity motor function was noted,   the above injectate was administered so that equal amounts of the injectate were placed at each foramen (level) into the transforaminal epidural space.   Additional Comments:  The patient tolerated the procedure well Dressing: 2 x 2 sterile gauze and Band-Aid    Post-procedure details: Patient was observed during the procedure. Post-procedure instructions were reviewed.  Patient left the clinic in stable condition.    Clinical History: No specialty comments available.   He reports that he quit smoking about 66 years ago. His smoking use included cigarettes. He has a 3.00 pack-year smoking history. He has never used smokeless tobacco.  Recent  Labs    10/01/22 1033  HGBA1C 5.9*    Objective:  VS:  HT:    WT:   BMI:     BP:(!) 180/76  HR:65bpm  TEMP: ( )  RESP:  Physical Exam Vitals and nursing note reviewed.  Constitutional:      General: He is not in acute distress.    Appearance: Normal appearance. He is well-developed. He is not ill-appearing.  HENT:     Head: Normocephalic and atraumatic.     Right Ear: External ear normal.     Left Ear: External ear normal.     Nose: No congestion.  Eyes:     Extraocular Movements: Extraocular movements intact.     Conjunctiva/sclera: Conjunctivae normal.     Pupils: Pupils are equal, round, and reactive to light.  Cardiovascular:     Rate and Rhythm: Normal rate.     Pulses: Normal pulses.     Heart sounds: Normal heart sounds.  Pulmonary:     Effort: Pulmonary effort is normal. No respiratory distress.  Abdominal:     General: There is no distension.     Palpations: Abdomen is soft.  Musculoskeletal:        General: No tenderness or signs of injury.     Cervical back: Normal range of motion and  neck supple. No rigidity.     Right lower leg: No edema.     Left lower leg: No edema.     Comments: Patient has good distal strength without clonus.  Skin:    General: Skin is warm and dry.     Findings: No erythema or rash.  Neurological:     General: No focal deficit present.     Mental Status: He is alert and oriented to person, place, and time.     Sensory: No sensory deficit.     Motor: No weakness or abnormal muscle tone.     Coordination: Coordination normal.     Gait: Gait normal.  Psychiatric:        Mood and Affect: Mood normal.        Behavior: Behavior normal.     Ortho Exam  Imaging: No results found.  Past Medical/Family/Surgical/Social History: Medications & Allergies reviewed per EMR, new medications updated. Patient Active Problem List   Diagnosis Date Noted   Pain in left wrist 03/28/2021   Cerebellar ataxia 11/15/2020   Balance disorder 11/15/2020   Spinal stenosis of lumbar region with neurogenic claudication 09/10/2019   Lumbar radiculopathy 09/10/2019   Presence of right artificial knee joint 05/20/2017   Unilateral primary osteoarthritis, left knee 05/20/2017   History of total right knee replacement 02/17/2017   Chronic pain of left knee 02/17/2017   Osteoarthritis of right knee 05/24/2016   Status post total right knee replacement 05/24/2016   Diverticulosis of colon without hemorrhage    CONSTIPATION 04/19/2010   ABDOMINAL PAIN, LEFT LOWER QUADRANT 09/18/2009   EPIGASTRIC PAIN 09/18/2009   CARCINOMA, RENAL CELL 08/08/2008   PNEUMONIA 08/08/2008   ACUTE PANCREATITIS 08/08/2008   NAUSEA 08/08/2008   ABDOMINAL PAIN, CHRONIC 08/08/2008   Past Medical History:  Diagnosis Date   Arthritis    Dizziness    GERD (gastroesophageal reflux disease)    GI bleed    ulcer   Hemorrhoid    Memory loss    Pancreatitis    Pneumonia    Renal cell carcinoma ([place])    Syncope    Tremor    Tubular adenoma 2013  Family History  Adopted: Yes   Problem Relation Age of Onset   Cancer Mother    Past Surgical History:  Procedure Laterality Date   [place]   COLONOSCOPY  06/16/2007   Dr. Gala Romney- minimal internal hemorrhoids o/w normal rectum,tubular adenoma   COLONOSCOPY  07/02/2012   Dr.Rourk- normal rectum, 3 5-7 mm peduncuated polyos, one in the mid sigmoid segment and 2 in the ascending segment. remainder of the colonic mucosa appeared normal. bx= tubular adenoma   COLONOSCOPY N/A 07/17/2015   Procedure: COLONOSCOPY;  Surgeon: Daneil Dolin, MD;  [facility];  Service: Endoscopy;  Laterality: N/A;  730    EYE SURGERY     cataract with lens implants   HAND SURGERY Right 1989, 1991   KNEE SURGERY Right 2000   NEPHRECTOMY Right 2000   pancreatic duct stent     mult times   SHOULDER SURGERY Left 09/16/2011   SPINAL FUSION     testicular tumor  1989   TOTAL KNEE ARTHROPLASTY Right 05/24/2016   Procedure: RIGHT TOTAL KNEE ARTHROPLASTY;  Surgeon: Mcarthur Rossetti, MD;  Location: [facility];  Service: Orthopedics;  Laterality: Right;   Social History   Occupational History    Comment: retired  Tobacco Use   Smoking status: Former    Packs/day: 1.00    Years: 3.00    Additional pack years: 0.00    Total pack years: 3.00    Types: Cigarettes    Quit date: 05/17/1956    Years since quitting: 66.6   Smokeless tobacco: Never   Tobacco comments:    quit in [place] Use   Vaping Use: Never used  Substance and Sexual Activity   Alcohol use: No    Comment: 08/11/19 "too much for 6 yrs", quit in 1958   Drug use: No   Sexual activity: Not on file

## 2023-01-02 ENCOUNTER — Telehealth: Payer: Self-pay | Admitting: Physical Medicine and Rehabilitation

## 2023-01-02 DIAGNOSIS — R2689 Other abnormalities of gait and mobility: Secondary | ICD-10-CM | POA: Diagnosis not present

## 2023-01-02 DIAGNOSIS — K219 Gastro-esophageal reflux disease without esophagitis: Secondary | ICD-10-CM | POA: Diagnosis not present

## 2023-01-02 DIAGNOSIS — Z85528 Personal history of other malignant neoplasm of kidney: Secondary | ICD-10-CM | POA: Diagnosis not present

## 2023-01-02 DIAGNOSIS — M810 Age-related osteoporosis without current pathological fracture: Secondary | ICD-10-CM | POA: Diagnosis not present

## 2023-01-02 DIAGNOSIS — M353 Polymyalgia rheumatica: Secondary | ICD-10-CM | POA: Diagnosis not present

## 2023-01-02 DIAGNOSIS — N289 Disorder of kidney and ureter, unspecified: Secondary | ICD-10-CM | POA: Diagnosis not present

## 2023-01-02 DIAGNOSIS — R2681 Unsteadiness on feet: Secondary | ICD-10-CM | POA: Diagnosis not present

## 2023-01-02 NOTE — Telephone Encounter (Signed)
Patient calling to advise that treatment helped and he is feeling much better.

## 2023-02-25 DIAGNOSIS — M79671 Pain in right foot: Secondary | ICD-10-CM | POA: Diagnosis not present

## 2023-02-25 DIAGNOSIS — M79674 Pain in right toe(s): Secondary | ICD-10-CM | POA: Diagnosis not present

## 2023-02-25 DIAGNOSIS — M79672 Pain in left foot: Secondary | ICD-10-CM | POA: Diagnosis not present

## 2023-02-25 DIAGNOSIS — I739 Peripheral vascular disease, unspecified: Secondary | ICD-10-CM | POA: Diagnosis not present

## 2023-02-25 DIAGNOSIS — M79675 Pain in left toe(s): Secondary | ICD-10-CM | POA: Diagnosis not present

## 2023-02-25 DIAGNOSIS — L110 Acquired keratosis follicularis: Secondary | ICD-10-CM | POA: Diagnosis not present

## 2023-03-19 DIAGNOSIS — E7801 Familial hypercholesterolemia: Secondary | ICD-10-CM | POA: Diagnosis not present

## 2023-03-19 DIAGNOSIS — N1832 Chronic kidney disease, stage 3b: Secondary | ICD-10-CM | POA: Diagnosis not present

## 2023-03-19 DIAGNOSIS — E782 Mixed hyperlipidemia: Secondary | ICD-10-CM | POA: Diagnosis not present

## 2023-03-19 DIAGNOSIS — E7800 Pure hypercholesterolemia, unspecified: Secondary | ICD-10-CM | POA: Diagnosis not present

## 2023-03-19 DIAGNOSIS — N138 Other obstructive and reflux uropathy: Secondary | ICD-10-CM | POA: Diagnosis not present

## 2023-03-19 DIAGNOSIS — R7301 Impaired fasting glucose: Secondary | ICD-10-CM | POA: Diagnosis not present

## 2023-03-19 DIAGNOSIS — E7849 Other hyperlipidemia: Secondary | ICD-10-CM | POA: Diagnosis not present

## 2023-03-26 DIAGNOSIS — R42 Dizziness and giddiness: Secondary | ICD-10-CM | POA: Diagnosis not present

## 2023-03-26 DIAGNOSIS — R4189 Other symptoms and signs involving cognitive functions and awareness: Secondary | ICD-10-CM | POA: Diagnosis not present

## 2023-03-26 DIAGNOSIS — K859 Acute pancreatitis without necrosis or infection, unspecified: Secondary | ICD-10-CM | POA: Diagnosis not present

## 2023-03-26 DIAGNOSIS — R5383 Other fatigue: Secondary | ICD-10-CM | POA: Diagnosis not present

## 2023-03-26 DIAGNOSIS — R7301 Impaired fasting glucose: Secondary | ICD-10-CM | POA: Diagnosis not present

## 2023-03-26 DIAGNOSIS — N1832 Chronic kidney disease, stage 3b: Secondary | ICD-10-CM | POA: Diagnosis not present

## 2023-03-26 DIAGNOSIS — H8190 Unspecified disorder of vestibular function, unspecified ear: Secondary | ICD-10-CM | POA: Diagnosis not present

## 2023-03-26 DIAGNOSIS — K2100 Gastro-esophageal reflux disease with esophagitis, without bleeding: Secondary | ICD-10-CM | POA: Diagnosis not present

## 2023-03-26 DIAGNOSIS — R030 Elevated blood-pressure reading, without diagnosis of hypertension: Secondary | ICD-10-CM | POA: Diagnosis not present

## 2023-03-26 DIAGNOSIS — N401 Enlarged prostate with lower urinary tract symptoms: Secondary | ICD-10-CM | POA: Diagnosis not present

## 2023-03-26 DIAGNOSIS — E7849 Other hyperlipidemia: Secondary | ICD-10-CM | POA: Diagnosis not present

## 2023-03-26 DIAGNOSIS — Z23 Encounter for immunization: Secondary | ICD-10-CM | POA: Diagnosis not present

## 2023-04-30 DIAGNOSIS — E782 Mixed hyperlipidemia: Secondary | ICD-10-CM | POA: Diagnosis not present

## 2023-04-30 DIAGNOSIS — N400 Enlarged prostate without lower urinary tract symptoms: Secondary | ICD-10-CM | POA: Diagnosis not present

## 2023-05-06 DIAGNOSIS — L110 Acquired keratosis follicularis: Secondary | ICD-10-CM | POA: Diagnosis not present

## 2023-05-06 DIAGNOSIS — M79674 Pain in right toe(s): Secondary | ICD-10-CM | POA: Diagnosis not present

## 2023-05-06 DIAGNOSIS — M79671 Pain in right foot: Secondary | ICD-10-CM | POA: Diagnosis not present

## 2023-05-06 DIAGNOSIS — I739 Peripheral vascular disease, unspecified: Secondary | ICD-10-CM | POA: Diagnosis not present

## 2023-05-06 DIAGNOSIS — M79672 Pain in left foot: Secondary | ICD-10-CM | POA: Diagnosis not present

## 2023-05-06 DIAGNOSIS — M79675 Pain in left toe(s): Secondary | ICD-10-CM | POA: Diagnosis not present

## 2023-05-27 ENCOUNTER — Ambulatory Visit: Payer: Medicare Other | Admitting: Internal Medicine

## 2023-05-30 ENCOUNTER — Encounter: Payer: Self-pay | Admitting: Internal Medicine

## 2023-05-30 ENCOUNTER — Ambulatory Visit: Payer: Medicare Other | Attending: Internal Medicine | Admitting: Internal Medicine

## 2023-05-30 VITALS — BP 128/82 | HR 52 | Ht 66.0 in | Wt 158.8 lb

## 2023-05-30 DIAGNOSIS — I459 Conduction disorder, unspecified: Secondary | ICD-10-CM | POA: Insufficient documentation

## 2023-05-30 DIAGNOSIS — R079 Chest pain, unspecified: Secondary | ICD-10-CM | POA: Insufficient documentation

## 2023-05-30 DIAGNOSIS — R55 Syncope and collapse: Secondary | ICD-10-CM | POA: Diagnosis not present

## 2023-05-30 NOTE — Progress Notes (Signed)
Cardiology Office Note:    Date:  05/30/2023   ID:  Joshua Strong, DOB October 11, 1930, MRN [id number]  PCP:  Joshua Pandy, MD  Cardiologist:  Joshua Poisson, MD  Electrophysiologist:  None   Referring MD: Joshua Pandy, MD   Chief Complaint/Reason for Referral: Syncope  History of Present Illness:    Joshua Strong is a 89 y.o. male with no significant past cardiovascular history, GERD, renal cell carcinoma, who presents for evaluation of syncope.   05/30/23: He is doing well overall.  Continues to have presyncopal episodes that are with position change.  He notes that if he rises too quickly from a seated position he has presyncopal symptoms, or if he turns too quickly while walking.  These sound primarily related to orthostasis and perhaps vestibular, and do not sound particularly cardiogenic in origin.  Recall we did a cardiac monitor which was overall low risk.  I do note on his EKG today that both PR interval and QRS duration have increased since my last documentation, suggesting continued conduction system disease.  Given interventricular conduction delay on his telemetry monitor, at this point I would recommend EP review.  We discussed the merits and risks of permanent pacemaker implant.  He maintains good quality of life at this time and continues to exercise.  He notes no presyncopal or syncopal symptoms with exertion.  He endorses chest pain, but notes that he is weaning his pantoprazole, and notes that the discomfort is more epigastric and is accompanied by nausea while laying down.  Would consider resuming some antacid therapy be at PPI or H2 blocker at the discretion of his GI team to mitigate symptoms of epigastric and chest pain.  If chest pain persists would then consider ischemic testing.  He does not have chest discomfort while doing "push pull" exercises or aerobic exercise or activity.  Participated in shared decision making regarding conduction system disease and chest  discomfort today.  Patient and I are in agreement on the plan.  Significant life stressor is that his wife has some developing memory concerns and may be developing some behavioral dyscontrol which he finds quite upsetting.  We discussed stress management strategies.  No chest pain or presyncope with emotional stressors.  11/22/22:  Had another syncopal episode since our last visit. Again climbing on a ladder reaching to a shelf. Recently moved, fell into his moving boxes. No injuries. On occasionally wearing his compression socks which he stated helped previously. Has been seen by neurology, not felt to have primary neurologic issue. Orthostatic VS noted. He brings a BP log to review. BP generally in 130s-150s/70s. We discussed at great length my concerns with treating BP of 150s systolic in setting of known orthostatic syncope. A bit of permissive BP would be advisable, he agrees. If BP consistently >160-170 we will discuss again. Continue to keep his log. Shared decision making - he and his son in law agree. Rare right sided chest pain, no other concerns. Married for 70 years, discussed that marital stressors do raise his BP. Discussed stress management strategies and obtaining home help given his age.   07/15/22: no recurrent syncope. Feeling well overall. We do not have monitor results to review however no notifications during event monitor period. Here today with his son. Wearing compression socks and thinks that has helped a lot. Echo was unremarkable for source of syncope. Does have moderate MAC, no MS.   Initial consult: Syncope has occurred x 2. Has had this  evaluated by cardiology previously, echocardiogram grossly normal in May 2020.   This episode he was working in the garage and fell backward and woke up on the ground. Unclear time of LOC.  Has bee noticing dizzy spells, will get dizzy in the shower or getting of bed. He will also however note it while sitting at the table.  EKG with RBBB  and first deg avb, some conduction system disease.  He has also been having one month of CP and SOB. Not entirely clear if symptoms are related or independent of driver of syncope.   The patient denies palpitations, PND, orthopnea, or leg swelling. Denies cough, fever, chills. Denies nausea, vomiting.  Past Medical History:  Diagnosis Date   Arthritis    Dizziness    GERD (gastroesophageal reflux disease)    GI bleed    ulcer   Hemorrhoid    Memory loss    Pancreatitis    Pneumonia    Renal cell carcinoma (HCC)    Syncope    Tremor    Tubular adenoma 2013    Past Surgical History:  Procedure Laterality Date   APPENDECTOMY  1975   BACK SURGERY     1961, 1963, 1965   CHOLECYSTECTOMY  1975   COLONOSCOPY  06/16/2007   Dr. Jena Gauss- minimal internal hemorrhoids o/w normal rectum,tubular adenoma   COLONOSCOPY  07/02/2012   Dr.Rourk- normal rectum, 3 5-7 mm peduncuated polyos, one in the mid sigmoid segment and 2 in the ascending segment. remainder of the colonic mucosa appeared normal. bx= tubular adenoma   COLONOSCOPY N/A 07/17/2015   Procedure: COLONOSCOPY;  Surgeon: Corbin Ade, MD;  [facility];  Service: Endoscopy;  Laterality: N/A;  730    EYE SURGERY     cataract with lens implants   HAND SURGERY Right 1989, 1991   KNEE SURGERY Right 2000   NEPHRECTOMY Right 2000   pancreatic duct stent     mult times   SHOULDER SURGERY Left 09/16/2011   SPINAL FUSION     testicular tumor  1989   TOTAL KNEE ARTHROPLASTY Right 05/24/2016   Procedure: RIGHT TOTAL KNEE ARTHROPLASTY;  Surgeon: Kathryne Hitch, MD;  Location: [facility];  Service: Orthopedics;  Laterality: Right;    Current Medications: Current Meds  Medication Sig   acetaminophen (TYLENOL) 650 MG CR tablet Take 650 mg by mouth every 8 (eight) hours as needed for pain (arthritis).   Calcium Carbonate (CALTRATE 600 PO) Take 1 tablet by mouth daily.   denosumab (PROLIA) 60 MG/ML SOSY injection Inject 60 mg  into the skin every 6 (six) months.   diazepam (VALIUM) 5 MG tablet Take one tablet by mouth with food one hour prior to procedure. May repeat 30 minutes prior if needed.   finasteride (PROSCAR) 5 MG tablet Take 5 mg by mouth daily.   fluticasone (FLONASE) 50 MCG/ACT nasal spray Place 1 spray into both nostrils daily as needed for allergies or rhinitis.   meclizine (ANTIVERT) 12.5 MG tablet Take 12.5 mg by mouth 3 (three) times daily as needed for dizziness.   Multiple Vitamin (MULTIVITAMIN WITH MINERALS) TABS tablet Take 1 tablet by mouth daily.   OVER THE COUNTER MEDICATION Super Beta Prostate twice daily   pantoprazole (PROTONIX) 40 MG tablet Take 40 mg by mouth 2 (two) times daily.   Polyethyl Glycol-Propyl Glycol (SYSTANE FREE OP) Apply 1-2 drops to eye daily. Both eyes   tamsulosin (FLOMAX) 0.4 MG CAPS capsule Take 0.4 mg by mouth  at bedtime.     Allergies:   Morphine and codeine and Metoclopramide hcl   Social History   Tobacco Use   Smoking status: Former    Current packs/day: 0.00    Average packs/day: 1 pack/day for 3.0 years (3.0 ttl pk-yrs)    Types: Cigarettes    Start date: 05/17/1953    Quit date: 05/17/1956    Years since quitting: 67.0   Smokeless tobacco: Never   Tobacco comments:    quit in 1958  Vaping Use   Vaping status: Never Used  Substance Use Topics   Alcohol use: No    Comment: 08/11/19 "too much for 6 yrs", quit in 1958   Drug use: No     Family History: The patient's family history includes Cancer in his mother. He was adopted.  ROS:   Please see the history of present illness.    All other systems reviewed and are negative.  EKGs/Labs/Other Studies Reviewed:    The following studies were reviewed today:  EKG:   EKG Interpretation Date/Time:  Friday May 30 2023 08:15:18 EDT Ventricular Rate:  52 PR Interval:  260 QRS Duration:  152 QT Interval:  428 QTC Calculation: 398 R Axis:   -30  Text Interpretation: Sinus bradycardia with 1st  degree A-V block Left axis deviation Right bundle branch block Since last tracing pr interval has increased QRS duration has increased Confirmed by Weston Brass ([id number]) on 05/30/2023 9:05:36 AM   Last: SR 1st deg AVB, RBBB QRS 140 ms  Imaging studies that I have independently reviewed today: n/a  Recent Labs: 08/24/2022: ALT 22; Hemoglobin 15.8; Magnesium 2.4; Platelets 227; TSH 1.329 10/01/2022: BUN 28; Creatinine, Ser 1.54; Potassium 5.2; Sodium 135  Recent Lipid Panel No results found for: "CHOL", "TRIG", "HDL", "CHOLHDL", "VLDL", "LDLCALC", "LDLDIRECT"  Physical Exam:    VS:  BP 128/82 (BP Location: Right Arm, Patient Position: Sitting, Cuff Size: Normal)   Pulse (!) 52   Ht 5\' 6"  (1.676 m)   Wt 158 lb 12.8 oz (72 kg)   SpO2 96%   BMI 25.63 kg/m     BP 113/62  height is 5\' 6"  (1.676 m) and weight is 158 lb 12.8 oz (72 kg). His blood pressure is 128/82 and his pulse is 52 (abnormal). His oxygen saturation is 96%.    Wt Readings from Last 5 Encounters:  05/30/23 158 lb 12.8 oz (72 kg)  11/22/22 154 lb 12.8 oz (70.2 kg)  10/01/22 149 lb (67.6 kg)  08/24/22 140 lb (63.5 kg)  07/15/22 150 lb 9.6 oz (68.3 kg)    Constitutional: No acute distress Eyes: sclera non-icteric, normal conjunctiva and lids ENMT: normal dentition, moist mucous membranes Cardiovascular: regular rhythm, reduced rate, no murmur. S1 and S2 normal. No jugular venous distention.  Respiratory: clear to auscultation bilaterally GI : normal bowel sounds, soft and nontender. No distention.   MSK: extremities warm, well perfused. No edema.  NEURO: grossly nonfocal exam, moves all extremities. PSYCH: alert and oriented x 3, normal mood and affect.   ASSESSMENT:    1. Chest pain of uncertain etiology   2. Syncope, unspecified syncope type      PLAN:    Chest pain of uncertain etiology  Syncope, unspecified syncope type - Plan: EKG 12-Lead  Participated in shared decision making, Monitor - IVCD and  mild bradycardia but symptoms are positional. Most likely orthostatic hypotension mediated (neurocardiogenic). Echo stable. He does however has prolonging PR interval, day time bradycardia, and prolonging  QRS in RBBB. I would like him to visit with EP at this point for progressing conduction system disease. If symptoms are felt to be purely orthostatic it is reasonable to observe and we both agreed on that.   Chest pain may be related to weaning off of pantoprazole, encourage further discussion with PCP. Continues to exercise without CP, seems ok to observe for now but would have low threshold for stress imaging.   Liberalize salt, hydrate well and wear compression socks -this helped the most.   Elevated BP - normal today and would defer treatment in setting of orthostatic symptoms. Will alert [place] if BP consistently severely elevated in 160-170 or greater range. PT in agreement with plan.       Total time of encounter: 30 minutes total time of encounter, including 20 minutes spent in face-to-face patient care on the date of this encounter. This time includes coordination of care and counseling regarding above mentioned problem list. Remainder of non-face-to-face time involved reviewing chart documents/testing relevant to the patient encounter and documentation in the medical record. I have independently reviewed documentation from referring provider.   Weston Brass, MD, [facility] [place]  [facility]     Shared Decision Making/Informed Consent:       Medication Adjustments/Labs and Tests Ordered: Current medicines are reviewed at length with the patient today.  Concerns regarding medicines are outlined above.   Orders Placed This Encounter  Procedures   EKG 12-Lead    No orders of the defined types were placed in this encounter.   There are no Patient Instructions on file for this visit.

## 2023-05-30 NOTE — Patient Instructions (Signed)
Medication Instructions:  Your physician recommends that you continue on your current medications as directed. Please refer to the Current Medication list given to you today.  *If you need a refill on your cardiac medications before your next appointment, please call your pharmacy*  Follow-Up: At [facility], you and your health needs are our priority.  As part of our continuing mission to provide you with exceptional heart care, we have created designated Provider Care Teams.  These Care Teams include your primary Cardiologist (physician) and Advanced Practice Providers (APPs -  Physician Assistants and Nurse Practitioners) who all work together to provide you with the care you need, when you need it.  We recommend signing up for the patient portal called "MyChart".  Sign up information is provided on this After Visit Summary.  MyChart is used to connect with patients for Virtual Visits (Telemedicine).  Patients are able to view lab/test results, encounter notes, upcoming appointments, etc.  Non-urgent messages can be sent to your provider as well.   To learn more about what you can do with MyChart, go to [email or web address].    Your next appointment:   6 month(s)  Provider:   Gayatri A Acharya, MD      

## 2023-07-04 DIAGNOSIS — M810 Age-related osteoporosis without current pathological fracture: Secondary | ICD-10-CM | POA: Diagnosis not present

## 2023-07-04 DIAGNOSIS — Z5181 Encounter for therapeutic drug level monitoring: Secondary | ICD-10-CM | POA: Diagnosis not present

## 2023-07-10 NOTE — Progress Notes (Signed)
[facility] - Clinic Note  07/17/2023     CHIEF COMPLAINT Patient presents for Retina Follow Up   HISTORY OF PRESENT ILLNESS: Joshua Strong is a 89 y.o. male who presents to the clinic today for:   HPI     Retina Follow Up   Patient presents with  Dry AMD.  In both eyes.  This started 9 months ago.  I, the attending physician,  performed the HPI with the patient and updated documentation appropriately.        Comments   Patient here for 9 months retina follow up for non exu ARMD OU. Patient states vision doing pretty good. OD has some blurriness with or without glasses. Not real bad. Can tell a difference. Not often. Has headaches on occasion on temple side forehead above OD. Bothersome. Has new glasses since last visit.      Last edited by Rennis Chris, MD on 07/20/2023 12:28 AM.     Pt feels like his right eye is a little fuzzy at times regardless if he is wearing glasses or not, he is using Systane twice a day, he is not taking AREDS, but does monitor his vision on an amsler grid  Referring physician: Desiree Lucy, OD [address]  [place], [place] [address]  HISTORICAL INFORMATION:   Selected notes from the MEDICAL RECORD NUMBER Referred by Dr. Earlene Plater -- concern for GCA LEE:  Ocular Hx- PMH-    CURRENT MEDICATIONS: Current Outpatient Medications (Ophthalmic Drugs)  Medication Sig   Polyethyl Glycol-Propyl Glycol (SYSTANE FREE OP) Apply 1-2 drops to eye daily. Both eyes   No current facility-administered medications for this visit. (Ophthalmic Drugs)   Current Outpatient Medications (Other)  Medication Sig   acetaminophen (TYLENOL) 650 MG CR tablet Take 650 mg by mouth every 8 (eight) hours as needed for pain (arthritis).   Calcium Carbonate (CALTRATE 600 PO) Take 1 tablet by mouth daily.   denosumab (PROLIA) 60 MG/ML SOSY injection Inject 60 mg into the skin every 6 (six) months.   diazepam (VALIUM) 5 MG tablet Take one tablet by  mouth with food one hour prior to procedure. May repeat 30 minutes prior if needed.   finasteride (PROSCAR) 5 MG tablet Take 5 mg by mouth daily.   fluticasone (FLONASE) 50 MCG/ACT nasal spray Place 1 spray into both nostrils daily as needed for allergies or rhinitis.   meclizine (ANTIVERT) 12.5 MG tablet Take 12.5 mg by mouth 3 (three) times daily as needed for dizziness.   Multiple Vitamin (MULTIVITAMIN WITH MINERALS) TABS tablet Take 1 tablet by mouth daily.   OVER THE COUNTER MEDICATION Super Beta Prostate twice daily   pantoprazole (PROTONIX) 40 MG tablet Take 40 mg by mouth 2 (two) times daily.   tamsulosin (FLOMAX) 0.4 MG CAPS capsule Take 0.4 mg by mouth at bedtime.   No current facility-administered medications for this visit. (Other)   REVIEW OF SYSTEMS: ROS   Positive for: Eyes Negative for: Constitutional, Gastrointestinal, Neurological, Skin, Genitourinary, Musculoskeletal, HENT, Endocrine, Cardiovascular, Respiratory, Psychiatric, Allergic/Imm, Heme/Lymph Last edited by Laddie Aquas, COA on 07/17/2023  9:15 AM.      ALLERGIES Allergies  Allergen Reactions   Morphine And Codeine Other (See Comments)    Hallucinations.   Metoclopramide Hcl Rash   PAST MEDICAL HISTORY Past Medical History:  Diagnosis Date   Arthritis    Dizziness    GERD (gastroesophageal reflux disease)    GI bleed  ulcer   Hemorrhoid    Memory loss    Pancreatitis    Pneumonia    Renal cell carcinoma (HCC)    Syncope    Tremor    Tubular adenoma 2013   Past Surgical History:  Procedure Laterality Date   APPENDECTOMY  1975   BACK SURGERY     1961, 1963, 1965   CHOLECYSTECTOMY  1975   COLONOSCOPY  06/16/2007   Dr. Jena Gauss- minimal internal hemorrhoids o/w normal rectum,tubular adenoma   COLONOSCOPY  07/02/2012   Dr.Rourk- normal rectum, 3 5-7 mm peduncuated polyos, one in the mid sigmoid segment and 2 in the ascending segment. remainder of the colonic mucosa appeared normal. bx=  tubular adenoma   COLONOSCOPY N/A 07/17/2015   Procedure: COLONOSCOPY;  Surgeon: Corbin Ade, MD;  Location: [facility];  Service: Endoscopy;  Laterality: N/A;  730    EYE SURGERY     cataract with lens implants   HAND SURGERY Right 1989, 1991   KNEE SURGERY Right 2000   NEPHRECTOMY Right 2000   pancreatic duct stent     mult times   SHOULDER SURGERY Left 09/16/2011   SPINAL FUSION     testicular tumor  1989   TOTAL KNEE ARTHROPLASTY Right 05/24/2016   Procedure: RIGHT TOTAL KNEE ARTHROPLASTY;  Surgeon: Kathryne Hitch, MD;  Location: [facility];  Service: Orthopedics;  Laterality: Right;   FAMILY HISTORY Family History  Adopted: Yes  Problem Relation Age of Onset   Cancer Mother    SOCIAL HISTORY Social History   Tobacco Use   Smoking status: Former    Current packs/day: 0.00    Average packs/day: 1 pack/day for 3.0 years (3.0 ttl pk-yrs)    Types: Cigarettes    Start date: 05/17/1953    Quit date: 05/17/1956    Years since quitting: 67.2   Smokeless tobacco: Never   Tobacco comments:    quit in 1958  Vaping Use   Vaping status: Never Used  Substance Use Topics   Alcohol use: No    Comment: 08/11/19 "too much for 6 yrs", quit in 1958   Drug use: No       OPHTHALMIC EXAM:  Base Eye Exam     Visual Acuity (Snellen - Linear)       Right Left   Dist cc 20/20 -1 20/20    Correction: Glasses         Tonometry (Tonopen, 9:11 AM)       Right Left   Pressure 17 17         Pupils       Dark Light Shape React APD   Right 1.5 1 Round Brisk None   Left 1.5 1 Round Brisk None         Visual Fields (Counting fingers)       Left Right    Full Full         Extraocular Movement       Right Left    Full, Ortho Full, Ortho         Neuro/Psych     Oriented x3: Yes   Mood/Affect: Normal         Dilation     Both eyes: 1.0% Mydriacyl, 2.5% Phenylephrine @ 9:11 AM           Slit Lamp and Fundus Exam     Slit Lamp Exam        Right Left   Lids/Lashes Dermatochalasis - upper  lid, mild MGD Dermatochalasis - upper lid, mild MGD   Conjunctiva/Sclera White and quiet White and quiet   Cornea arcus, well healed cataract wound, nasal LRI, trace tear film debris, trace PEE Mild arcus, well healed cataract wound, trace PEE, trace fine endo pigment   Anterior Chamber Deep and quiet Deep and quiet, vitreous strands wicking to SN cataract wound (1030), No cell or flare   Iris round and poorly dilated to 4.0, Transillumination defects from 0130-0330 round and poorly dilated to 3.5, faint peripheral TID from 0245-0400   Lens PC IOL in good position with open PC PC IOL in good position with open PC   Anterior Vitreous Vitreous syneresis, Posterior vitreous detachment Vitreous syneresis, Posterior vitreous detachment, vitreous condensations         Fundus Exam       Right Left   Disc trace pallor, Sharp rim Pink and Sharp   C/D Ratio 0.1 0.3   Macula Flat, Blunted foveal reflex, Drusen, RPE mottling, no heme or edema Flat, Blunted foveal reflex, Drusen, RPE mottling and clumping, focal pigment clump IN to fovea, No heme or edema   Vessels attenuated, mild tortuosity attenuated, mild tortuosity   Periphery Attached, reticular degeneration, No RT/RD, No heme Attached, retinal tear with pigmented laser scars surrounding at 0300, reticular degeneration, no new RT/RD           Refraction     Wearing Rx       Sphere Cylinder Axis Add   Right -1.75 +2.00 158 +2.50   Left -0.50 +1.00 027 +2.50           IMAGING AND PROCEDURES  Imaging and Procedures for 07/17/2023  OCT, Retina - OU - Both Eyes       Right Eye Quality was good. Central Foveal Thickness: 241. Progression has been stable. Findings include normal foveal contour, no IRF, no SRF, retinal drusen .   Left Eye Quality was good. Central Foveal Thickness: 242. Progression has been stable. Findings include normal foveal contour, no IRF, no SRF, retinal  drusen (Focal prominent druse IN to fovea -- stable).   Notes *Images captured and stored on drive  Diagnosis / Impression:  NFP, no IRF/SRF OU Mild drusen OU -- stable OS: Focal prominent druse IN to fovea -- stable  Clinical management:  See below  Abbreviations: NFP - Normal foveal profile. CME - cystoid macular edema. PED - pigment epithelial detachment. IRF - intraretinal fluid. SRF - subretinal fluid. EZ - ellipsoid zone. ERM - epiretinal membrane. ORA - outer retinal atrophy. ORT - outer retinal tubulation. SRHM - subretinal hyper-reflective material. IRHM - intraretinal hyper-reflective material             ASSESSMENT/PLAN:    ICD-10-CM   1. Intermediate stage nonexudative age-related macular degeneration of both eyes  H35.3132 OCT, Retina - OU - Both Eyes    2. History of repair of retinal tear by laser photocoagulation  Z98.890     3. Essential hypertension  I10     4. Hypertensive retinopathy of both eyes  H35.033     5. Pseudophakia, both eyes  Z96.1      Age related macular degeneration, non-exudative, both eyes  - intermediate stage w/ mild drusen OU  - BCVA 20/20 OU -- stable  - OS w/ prominent focal druse IN to fovea -- stable on OCT  - FA 6.5.23 w/o leakage or CNV OU  - The incidence, anatomy, and pathology of dry AMD, risk of progression,  and the AREDS and AREDS 2 study including smoking risks discussed with patient.  - Recommend amsler grid monitoring  - f/u 1 year, DFE, OCT  2. History of retinal tear s/p laser retinopexy OS  - retinal tear at 0300 with good laser in place (JDM)  - no new RT/RD  3,4. Hypertensive retinopathy OU - discussed importance of tight BP control - monitor  5. Pseudophakia OU  - s/p CE/IOL (Dr. Jettie Pagan) - doing well  - monitor  Ophthalmic Meds Ordered this visit:  No orders of the defined types were placed in this encounter.    Return in about 1 year (around 07/16/2024) for f/u non-exu ARMD OU, DFE, OCT.  There  are no Patient Instructions on file for this visit.  Explained the diagnoses, plan, and follow up with the patient and they expressed understanding.  Patient expressed understanding of the importance of proper follow up care. .  This document serves as a record of services personally performed by Karie Chimera, MD, PhD. It was created on their behalf by Glee Arvin. Manson Passey, OA an ophthalmic technician. The creation of this record is the provider's dictation and/or activities during the visit.    Electronically signed by: Glee Arvin. Manson Passey, OA 07/20/23 12:29 AM   Karie Chimera, M.D., Ph.D. Diseases & Surgery of the Retina and Vitreous [facility]  I have reviewed the above documentation for accuracy and completeness, and I agree with the above. Karie Chimera, M.D., Ph.D. 07/20/23 1:27 AM  Abbreviations: M myopia (nearsighted); A astigmatism; H hyperopia (farsighted); P presbyopia; Mrx spectacle prescription;  CTL contact lenses; OD right eye; OS left eye; OU both eyes  XT exotropia; ET esotropia; PEK punctate epithelial keratitis; PEE punctate epithelial erosions; DES dry eye syndrome; MGD meibomian gland dysfunction; ATs artificial tears; PFAT's preservative free artificial tears; NSC nuclear sclerotic cataract; PSC posterior subcapsular cataract; ERM epi-retinal membrane; PVD posterior vitreous detachment; RD retinal detachment; DM diabetes mellitus; DR diabetic retinopathy; NPDR non-proliferative diabetic retinopathy; PDR proliferative diabetic retinopathy; CSME clinically significant macular edema; DME diabetic macular edema; dbh dot blot hemorrhages; CWS cotton wool spot; POAG primary open angle glaucoma; C/D cup-to-disc ratio; HVF humphrey visual field; GVF goldmann visual field; OCT optical coherence tomography; IOP intraocular pressure; BRVO Branch retinal vein occlusion; CRVO central retinal vein occlusion; CRAO central retinal artery occlusion; BRAO branch retinal  artery occlusion; RT retinal tear; SB scleral buckle; PPV pars plana vitrectomy; VH Vitreous hemorrhage; PRP panretinal laser photocoagulation; IVK intravitreal kenalog; VMT vitreomacular traction; MH Macular hole;  NVD neovascularization of the disc; NVE neovascularization elsewhere; AREDS age related eye disease study; ARMD age related macular degeneration; POAG primary open angle glaucoma; EBMD epithelial/anterior basement membrane dystrophy; ACIOL anterior chamber intraocular lens; IOL intraocular lens; PCIOL posterior chamber intraocular lens; Phaco/IOL phacoemulsification with intraocular lens placement; PRK photorefractive keratectomy; LASIK laser assisted in situ keratomileusis; HTN hypertension; DM diabetes mellitus; COPD chronic obstructive pulmonary disease

## 2023-07-15 DIAGNOSIS — M79675 Pain in left toe(s): Secondary | ICD-10-CM | POA: Diagnosis not present

## 2023-07-15 DIAGNOSIS — M79674 Pain in right toe(s): Secondary | ICD-10-CM | POA: Diagnosis not present

## 2023-07-15 DIAGNOSIS — I739 Peripheral vascular disease, unspecified: Secondary | ICD-10-CM | POA: Diagnosis not present

## 2023-07-15 DIAGNOSIS — M79671 Pain in right foot: Secondary | ICD-10-CM | POA: Diagnosis not present

## 2023-07-15 DIAGNOSIS — M79672 Pain in left foot: Secondary | ICD-10-CM | POA: Diagnosis not present

## 2023-07-15 DIAGNOSIS — L110 Acquired keratosis follicularis: Secondary | ICD-10-CM | POA: Diagnosis not present

## 2023-07-17 ENCOUNTER — Encounter (INDEPENDENT_AMBULATORY_CARE_PROVIDER_SITE_OTHER): Payer: Self-pay | Admitting: Ophthalmology

## 2023-07-17 ENCOUNTER — Ambulatory Visit (INDEPENDENT_AMBULATORY_CARE_PROVIDER_SITE_OTHER): Payer: Medicare Other | Admitting: Ophthalmology

## 2023-07-17 DIAGNOSIS — H35033 Hypertensive retinopathy, bilateral: Secondary | ICD-10-CM | POA: Diagnosis not present

## 2023-07-17 DIAGNOSIS — I10 Essential (primary) hypertension: Secondary | ICD-10-CM | POA: Diagnosis not present

## 2023-07-17 DIAGNOSIS — Z9889 Other specified postprocedural states: Secondary | ICD-10-CM

## 2023-07-17 DIAGNOSIS — H353132 Nonexudative age-related macular degeneration, bilateral, intermediate dry stage: Secondary | ICD-10-CM | POA: Diagnosis not present

## 2023-07-17 DIAGNOSIS — Z961 Presence of intraocular lens: Secondary | ICD-10-CM | POA: Diagnosis not present

## 2023-07-20 ENCOUNTER — Encounter (INDEPENDENT_AMBULATORY_CARE_PROVIDER_SITE_OTHER): Payer: Self-pay | Admitting: Ophthalmology

## 2023-07-22 DIAGNOSIS — K859 Acute pancreatitis without necrosis or infection, unspecified: Secondary | ICD-10-CM | POA: Diagnosis not present

## 2023-07-22 DIAGNOSIS — R7301 Impaired fasting glucose: Secondary | ICD-10-CM | POA: Diagnosis not present

## 2023-07-22 DIAGNOSIS — E7849 Other hyperlipidemia: Secondary | ICD-10-CM | POA: Diagnosis not present

## 2023-07-22 DIAGNOSIS — E782 Mixed hyperlipidemia: Secondary | ICD-10-CM | POA: Diagnosis not present

## 2023-07-28 DIAGNOSIS — N1832 Chronic kidney disease, stage 3b: Secondary | ICD-10-CM | POA: Diagnosis not present

## 2023-07-28 DIAGNOSIS — R030 Elevated blood-pressure reading, without diagnosis of hypertension: Secondary | ICD-10-CM | POA: Diagnosis not present

## 2023-07-28 DIAGNOSIS — R4189 Other symptoms and signs involving cognitive functions and awareness: Secondary | ICD-10-CM | POA: Diagnosis not present

## 2023-07-28 DIAGNOSIS — N401 Enlarged prostate with lower urinary tract symptoms: Secondary | ICD-10-CM | POA: Diagnosis not present

## 2023-07-28 DIAGNOSIS — M810 Age-related osteoporosis without current pathological fracture: Secondary | ICD-10-CM | POA: Diagnosis not present

## 2023-07-28 DIAGNOSIS — Z23 Encounter for immunization: Secondary | ICD-10-CM | POA: Diagnosis not present

## 2023-07-28 DIAGNOSIS — N138 Other obstructive and reflux uropathy: Secondary | ICD-10-CM | POA: Diagnosis not present

## 2023-07-28 DIAGNOSIS — K859 Acute pancreatitis without necrosis or infection, unspecified: Secondary | ICD-10-CM | POA: Diagnosis not present

## 2023-07-28 DIAGNOSIS — R7301 Impaired fasting glucose: Secondary | ICD-10-CM | POA: Diagnosis not present

## 2023-07-28 DIAGNOSIS — R42 Dizziness and giddiness: Secondary | ICD-10-CM | POA: Diagnosis not present

## 2023-07-28 DIAGNOSIS — R5383 Other fatigue: Secondary | ICD-10-CM | POA: Diagnosis not present

## 2023-07-28 DIAGNOSIS — E7849 Other hyperlipidemia: Secondary | ICD-10-CM | POA: Diagnosis not present

## 2023-08-06 ENCOUNTER — Ambulatory Visit
Admission: EM | Admit: 2023-08-06 | Discharge: 2023-08-06 | Disposition: A | Discharge status: Home / Self Care | Payer: Medicare Other | Attending: Family Medicine | Admitting: Family Medicine

## 2023-08-06 ENCOUNTER — Encounter: Payer: Self-pay | Admitting: Emergency Medicine

## 2023-08-06 DIAGNOSIS — J0100 Acute maxillary sinusitis, unspecified: Secondary | ICD-10-CM

## 2023-08-06 MED ORDER — PROMETHAZINE-DM 6.25-15 MG/5ML PO SYRP: 5.0000 mL | ORAL_SOLUTION | Freq: Four times a day (QID) | ORAL | 0 refills | Status: DC | PRN

## 2023-08-06 MED ORDER — AMOXICILLIN-POT CLAVULANATE 875-125 MG PO TABS: 1.0000 | ORAL_TABLET | Freq: Two times a day (BID) | ORAL | 0 refills | Status: DC

## 2023-08-06 NOTE — ED Triage Notes (Signed)
Productive cough and nasal congestion x 3 to 4 weeks.  Has gotten worse over the past 3 to 4 days.  States teeth, eyes and head hurts.  Has been taking xyzal

## 2023-08-06 NOTE — ED Provider Notes (Signed)
[facility]    CSN: [id number] Arrival date & time: 08/06/23  0910      History   Chief Complaint No chief complaint on file.   HPI Joshua Strong is a 89 y.o. male.   Patient presenting today with about 3 weeks of progressively worsening productive cough, nasal congestion, facial pain and pressure, teeth hurting now.  Denies fever, chills, chest pain, shortness of breath, abdominal pain, nausea vomiting or diarrhea.  So far trying Xyzal, over-the-counter remedies with minimal relief.    Past Medical History:  Diagnosis Date   Arthritis    Dizziness    GERD (gastroesophageal reflux disease)    GI bleed    ulcer   Hemorrhoid    Memory loss    Pancreatitis    Pneumonia    Renal cell carcinoma (HCC)    Syncope    Tremor    Tubular adenoma 2013    Patient Active Problem List   Diagnosis Date Noted   Pain in left wrist 03/28/2021   Cerebellar ataxia (HCC) 11/15/2020   Balance disorder 11/15/2020   Spinal stenosis of lumbar region with neurogenic claudication 09/10/2019   Lumbar radiculopathy 09/10/2019   Presence of right artificial knee joint 05/20/2017   Unilateral primary osteoarthritis, left knee 05/20/2017   History of total right knee replacement 02/17/2017   Chronic pain of left knee 02/17/2017   Osteoarthritis of right knee 05/24/2016   Status post total right knee replacement 05/24/2016   Diverticulosis of colon without hemorrhage    Constipation 04/19/2010   ABDOMINAL PAIN, LEFT LOWER QUADRANT 09/18/2009   EPIGASTRIC PAIN 09/18/2009   Malignant neoplasm of kidney excluding renal pelvis (HCC) 08/08/2008   PNEUMONIA 08/08/2008   ACUTE PANCREATITIS 08/08/2008   NAUSEA 08/08/2008   Abdominal pain 08/08/2008    Past Surgical History:  Procedure Laterality Date   APPENDECTOMY  1975   BACK SURGERY     1961, 1963, 1965   CHOLECYSTECTOMY  1975   COLONOSCOPY  06/16/2007   Dr. Jena Gauss- minimal internal hemorrhoids o/w normal rectum,tubular adenoma    COLONOSCOPY  07/02/2012   Dr.Rourk- normal rectum, 3 5-7 mm peduncuated polyos, one in the mid sigmoid segment and 2 in the ascending segment. remainder of the colonic mucosa appeared normal. bx= tubular adenoma   COLONOSCOPY N/A 07/17/2015   Procedure: COLONOSCOPY;  Surgeon: Corbin Ade, MD;  Location: [facility];  Service: Endoscopy;  Laterality: N/A;  730    EYE SURGERY     cataract with lens implants   HAND SURGERY Right 1989, 1991   KNEE SURGERY Right 2000   NEPHRECTOMY Right 2000   pancreatic duct stent     mult times   SHOULDER SURGERY Left 09/16/2011   SPINAL FUSION     testicular tumor  1989   TOTAL KNEE ARTHROPLASTY Right 05/24/2016   Procedure: RIGHT TOTAL KNEE ARTHROPLASTY;  Surgeon: Kathryne Hitch, MD;  Location: [facility];  Service: Orthopedics;  Laterality: Right;       Home Medications    Prior to Admission medications   Medication Sig Start Date End Date Taking? Authorizing Provider  amoxicillin-clavulanate (AUGMENTIN) 875-125 MG tablet Take 1 tablet by mouth every 12 (twelve) hours. 08/06/23  Yes Particia Nearing, PA-C  promethazine-dextromethorphan (PROMETHAZINE-DM) 6.25-15 MG/5ML syrup Take 5 mLs by mouth 4 (four) times daily as needed. 08/06/23  Yes Particia Nearing, PA-C  acetaminophen (TYLENOL) 650 MG CR tablet Take 650 mg by mouth every 8 (eight) hours as  needed for pain (arthritis).    [provider]  Calcium Carbonate (CALTRATE 600 PO) Take 1 tablet by mouth daily.    [provider]  denosumab (PROLIA) 60 MG/ML SOSY injection Inject 60 mg into the skin every 6 (six) months.    [provider]  diazepam (VALIUM) 5 MG tablet Take one tablet by mouth with food one hour prior to procedure. May repeat 30 minutes prior if needed. 12/13/22   Juanda Chance, NP  finasteride (PROSCAR) 5 MG tablet Take 5 mg by mouth daily.    [provider]  fluticasone (FLONASE) 50 MCG/ACT nasal spray Place 1 spray into  both nostrils daily as needed for allergies or rhinitis.    [provider]  meclizine (ANTIVERT) 12.5 MG tablet Take 12.5 mg by mouth 3 (three) times daily as needed for dizziness.    [provider]  Multiple Vitamin (MULTIVITAMIN WITH MINERALS) TABS tablet Take 1 tablet by mouth daily.    [provider]  OVER THE COUNTER MEDICATION Super Beta Prostate twice daily    [provider]  pantoprazole (PROTONIX) 40 MG tablet Take 40 mg by mouth 2 (two) times daily. 07/20/22   [provider]  Polyethyl Glycol-Propyl Glycol (SYSTANE FREE OP) Apply 1-2 drops to eye daily. Both eyes    [provider]  tamsulosin (FLOMAX) 0.4 MG CAPS capsule Take 0.4 mg by mouth at bedtime. 12/11/22   [provider]    Family History Family History  Adopted: Yes  Problem Relation Age of Onset   Cancer Mother     Social History Social History   Tobacco Use   Smoking status: Former    Current packs/day: 0.00    Average packs/day: 1 pack/day for 3.0 years (3.0 ttl pk-yrs)    Types: Cigarettes    Start date: 05/17/1953    Quit date: 05/17/1956    Years since quitting: 67.2   Smokeless tobacco: Never   Tobacco comments:    quit in 1958  Vaping Use   Vaping status: Never Used  Substance Use Topics   Alcohol use: No    Comment: 08/11/19 "too much for 6 yrs", quit in 1958   Drug use: No     Allergies   Morphine and codeine and Metoclopramide hcl   Review of Systems Review of Systems Per HPI  Physical Exam Triage Vital Signs ED Triage Vitals  Encounter Vitals Group     BP 08/06/23 0925 127/70     Systolic BP Percentile --      Diastolic BP Percentile --      Pulse Rate 08/06/23 0925 93     Resp 08/06/23 0925 18     Temp 08/06/23 0925 97.9 F (36.6 C)     Temp Source 08/06/23 0925 Oral     SpO2 08/06/23 0925 93 %     Weight --      Height --      Head Circumference --      Peak Flow --      Pain Score 08/06/23 0927 6      Pain Loc --      Pain Education --      Exclude from Growth Chart --    No data found.  Updated Vital Signs BP 127/70 (BP Location: Right Arm)   Pulse 93   Temp 97.9 F (36.6 C) (Oral)   Resp 18   SpO2 93%   Visual Acuity Right Eye Distance:  Left Eye Distance:   Bilateral Distance:    Right Eye Near:   Left Eye Near:    Bilateral Near:     Physical Exam Vitals and nursing note reviewed.  Constitutional:      Appearance: He is well-developed.  HENT:     Head: Atraumatic.     Right Ear: External ear normal.     Left Ear: External ear normal.     Nose: Congestion present.     Mouth/Throat:     Pharynx: Posterior oropharyngeal erythema present. No oropharyngeal exudate.  Eyes:     Conjunctiva/sclera: Conjunctivae normal.     Pupils: Pupils are equal, round, and reactive to light.  Cardiovascular:     Rate and Rhythm: Normal rate and regular rhythm.  Pulmonary:     Effort: Pulmonary effort is normal. No respiratory distress.     Breath sounds: No wheezing or rales.  Musculoskeletal:        General: Normal range of motion.     Cervical back: Normal range of motion and neck supple.  Lymphadenopathy:     Cervical: No cervical adenopathy.  Skin:    General: Skin is warm and dry.  Neurological:     Mental Status: He is alert and oriented to person, place, and time.  Psychiatric:        Behavior: Behavior normal.      UC Treatments / Results  Labs (all labs ordered are listed, but only abnormal results are displayed) Labs Reviewed - No data to display  EKG   Radiology No results found.  Procedures Procedures (including critical care time)  Medications Ordered in UC Medications - No data to display  Initial Impression / Assessment and Plan / UC Course  I have reviewed the triage vital signs and the nursing notes.  Pertinent labs & imaging results that were available during my care of the patient were reviewed by me and considered in my medical  decision making (see chart for details).     Given duration worsening course, treat with Augmentin, Phenergan DM, allergy regimen and supportive over-the-counter medications and home care.  Return for worsening symptoms.  Final Clinical Impressions(s) / UC Diagnoses   Final diagnoses:  Acute maxillary sinusitis, recurrence not specified   Discharge Instructions   None    ED Prescriptions     Medication Sig Dispense Auth. Provider   amoxicillin-clavulanate (AUGMENTIN) 875-125 MG tablet Take 1 tablet by mouth every 12 (twelve) hours. 14 tablet Particia Nearing, [place]   promethazine-dextromethorphan (PROMETHAZINE-DM) 6.25-15 MG/5ML syrup Take 5 mLs by mouth 4 (four) times daily as needed. 100 mL Particia Nearing, [place]      PDMP not reviewed this encounter.   Particia Nearing, [place] 08/06/23 1023

## 2023-08-21 NOTE — Progress Notes (Signed)
Electrophysiology Office Note:   Date:  08/22/2023  ID:  Joshua Strong, DOB Oct 07, 1930, MRN [id number]  Primary Cardiologist: Parke Poisson, MD Electrophysiologist: Nobie Putnam, MD      History of Present Illness:   Joshua Strong is a 89 y.o. male with h/o GERD, renal cell carcinoma, right bundle branch block and first-degree AV delay who is being seen today for evaluation of syncope at the request of Dr. Jacques Navy.   Patient has had multiple episodes of syncope occurring over the past few years. He has frequent dizzy spells throughout the week. These always appear to be associated with quick movements (turning head or standing up) or with walking. He wore a heart monitor for 30 days a year ago. He states that he was having these episodes during that time, but they are now more frequent. He has had a total of 5 falls which he attributes to his dizziness and syncope. He is able to be quite active and exercises 3 days a week without issue. Today, he reports feeling relatively well. He has no new or acute complaints.   Review of systems complete and found to be negative unless listed in HPI.   EP Information / Studies Reviewed:    EKG is ordered today. Personal review as below.  EKG Interpretation Date/Time:  Friday August 22 2023 14:00:58 EST Ventricular Rate:  64 PR Interval:  242 QRS Duration:  148 QT Interval:  422 QTC Calculation: 435 R Axis:   96  Text Interpretation: Sinus rhythm with 1st degree A-V block Right bundle branch block Left posterior fascicular block Confirmed by Nobie Putnam [phone]) on 08/22/2023 4:32:52 PM   Echo 06/14/2022: Normal LV size and function, LVEF 60 to 65%.  Grade 1 diastolic dysfunction. Normal RV size and function. Left atrium mildly dilated. Moderate MAC.  No mitral stenosis. Aortic sclerosis without stenosis.  Cardiac monitor 05/2022: Sinus bradycardia with RBBB and first-degree AV delay. Heart rate ranging 40 to 136 bpm with an average of  64bpm. No pauses.   Physical Exam:   VS:  BP (!) 142/68   Pulse 65   Ht 5\' 6"  (1.676 m)   Wt 163 lb 6.4 oz (74.1 kg)   SpO2 96%   BMI 26.37 kg/m    Wt Readings from Last 3 Encounters:  08/22/23 163 lb 6.4 oz (74.1 kg)  05/30/23 158 lb 12.8 oz (72 kg)  11/22/22 154 lb 12.8 oz (70.2 kg)     GEN: Well nourished, well developed in no acute distress NECK: No JVD CARDIAC: Normal rate and regular rhythm.  RESPIRATORY:  Clear to auscultation without rales, wheezing or rhonchi  ABDOMEN: Soft, non-tender, non-distended EXTREMITIES:  No edema; No deformity   ASSESSMENT AND PLAN:   Joshua Strong is a 89 y.o. male with h/o GERD, renal cell carcinoma, right bundle branch block and first-degree AV delay who is being seen today for evaluation of syncope at the request of Dr. Jacques Navy. His syncope is not classic for an arrhythmia. Most of his episodes of dizziness and presycope appear to be associated with positional changes. He dose, however, have evidence of conduction disease - first degree AV delay, RBBB and LPFB on today's ECG. He wore a monitor previously but did not show any high grade AV block or CHB. He does not appear to have symptomatic bradycardia or chronotropic incompetence at baseline.   # Syncope # Right bundle branch block # First-degree AV delay # Asymptomatic bradycardia  Plan:  -  Repeat cardiac monitor - live Zio monitor. - No indication for pacemaker at this time. Need to see correlation of symptoms with bradycardia, chronotropic incompetence, high grade AV block, or intermittent CHB.   #Hypertension Above goal today.  Recommend checking blood pressures 1-2 times per week at home and recording the values.  Recommend bringing these recordings to the primary care physician.  Signed, Nobie Putnam, MD

## 2023-08-22 ENCOUNTER — Encounter: Payer: Self-pay | Admitting: Cardiology

## 2023-08-22 ENCOUNTER — Other Ambulatory Visit (INDEPENDENT_AMBULATORY_CARE_PROVIDER_SITE_OTHER): Payer: Medicare Other

## 2023-08-22 ENCOUNTER — Ambulatory Visit: Payer: Medicare Other | Attending: Cardiology | Admitting: Cardiology

## 2023-08-22 VITALS — BP 142/68 | HR 65 | Ht 66.0 in | Wt 163.4 lb

## 2023-08-22 DIAGNOSIS — I440 Atrioventricular block, first degree: Secondary | ICD-10-CM | POA: Diagnosis not present

## 2023-08-22 DIAGNOSIS — I445 Left posterior fascicular block: Secondary | ICD-10-CM

## 2023-08-22 DIAGNOSIS — R001 Bradycardia, unspecified: Secondary | ICD-10-CM

## 2023-08-22 DIAGNOSIS — I4510 Unspecified right bundle-branch block: Secondary | ICD-10-CM

## 2023-08-22 DIAGNOSIS — I459 Conduction disorder, unspecified: Secondary | ICD-10-CM

## 2023-08-22 NOTE — Patient Instructions (Signed)
Medication Instructions:  Your physician recommends that you continue on your current medications as directed. Please refer to the Current Medication list given to you today.  *If you need a refill on your cardiac medications before your next appointment, please call your pharmacy*  Testing/Procedures: Your physician has recommended that you wear an event monitor. Event monitors are medical devices that record the heart's electrical activity. Doctors most often [place] these monitors to diagnose arrhythmias. Arrhythmias are problems with the speed or rhythm of the heartbeat. The monitor is a small, portable device. You can wear one while you do your normal daily activities. This is usually used to diagnose what is causing palpitations/syncope (passing out).  Follow-Up: At [facility], you and your health needs are our priority.  As part of our continuing mission to provide you with exceptional heart care, we have created designated Provider Care Teams.  These Care Teams include your primary Cardiologist (physician) and Advanced Practice Providers (APPs -  Physician Assistants and Nurse Practitioners) who all work together to provide you with the care you need, when you need it.   Your next appointment:   As needed based on results of testing  Other Instructions ZIO AT Long term monitor-Live Telemetry  Your physician has requested you wear a ZIO patch monitor for 14 days.  This is a single patch monitor. [employer] supplies one patch monitor per enrollment. Additional  stickers are not available.  Please do not apply patch if you will be having a Nuclear Stress Test, Echocardiogram, Cardiac CT, MRI,  or Chest Xray during the period you would be wearing the monitor. The patch cannot be worn during  these tests. You cannot remove and re-apply the ZIO AT patch monitor.  Your ZIO patch monitor will be mailed 3 day USPS to your address on file. It may take 3-5 days to  receive your monitor after  you have been enrolled.  Once you have received your monitor, please review the enclosed instructions. Your monitor has  already been registered assigning a specific monitor serial # to you.   Billing and Patient Assistance Program information  Meredeth Ide has been supplied with any insurance information on record for billing. [employer] offers a sliding scale Patient Assistance Program for patients without insurance, or whose  insurance does not completely cover the cost of the ZIO patch monitor. You must apply for the  Patient Assistance Program to qualify for the discounted rate. To apply, call [employer] at [phone],  select option 4, select option 2 , ask to apply for the Patient Assistance Program, (you can request an  interpreter if needed). [employer] will ask your household income and how many people are in your  household. [employer] will quote your out-of-pocket cost based on this information. They will also be able  to set up a 12 month interest free payment plan if needed.  Applying the monitor   Shave hair from upper left chest.  Hold the abrader disc by orange tab. Rub the abrader in 40 strokes over left upper chest as indicated in  your monitor instructions.  Clean area with 4 enclosed alcohol pads. Use all pads to ensure the area is cleaned thoroughly. Let  dry.  Apply patch as indicated in monitor instructions. Patch will be placed under collarbone on left side of  chest with arrow pointing upward.  Rub patch adhesive wings for 2 minutes. Remove the white label marked "1". Remove the white label  marked "2". Rub patch adhesive wings for  2 additional minutes.  While looking in a mirror, press and release button in center of patch. A small green light will flash 3-4  times. This will be your only indicator that the monitor has been turned on.  Do not shower for the first 24 hours. You may shower after the first 24 hours.  Press the button if you feel a symptom. You will hear a small  click. Record Date, Time and Symptom in  the Patient Log.   Starting the Gateway  In your kit there is a Audiological scientist box the size of a cellphone. This is Buyer, retail. It transmits all your  recorded data to [facility]. This box must always stay within 10 feet of you. Open the box and push the *  button. There will be a light that blinks orange and then green a few times. When the light stops  blinking, the Gateway is connected to the ZIO patch. Call [employer] at [phone] to confirm your monitor is transmitting.  Returning your monitor  Remove your patch and place it inside the Gateway. In the lower half of the Gateway there is a white  bag with prepaid postage on it. Place Gateway in bag and seal. Mail package back to [place] as soon as  possible. Your physician should have your final report approximately 7 days after you have mailed back  your monitor. Call [facility] Customer Care at [phone] if you have questions regarding your ZIO AT  patch monitor. Call them immediately if you see an orange light blinking on your monitor.  If your monitor falls off in less than 4 days, contact our Monitor department at [phone]. If your  monitor becomes loose or falls off after 4 days call [employer] at [phone] for suggestions on  securing your monitor

## 2023-08-22 NOTE — Progress Notes (Unsigned)
Enrolled patient for a 14 day Zio AT monitor to be mailed to patients home.

## 2023-08-29 DIAGNOSIS — E782 Mixed hyperlipidemia: Secondary | ICD-10-CM | POA: Diagnosis not present

## 2023-08-29 DIAGNOSIS — N401 Enlarged prostate with lower urinary tract symptoms: Secondary | ICD-10-CM | POA: Diagnosis not present

## 2023-08-29 DIAGNOSIS — K2100 Gastro-esophageal reflux disease with esophagitis, without bleeding: Secondary | ICD-10-CM | POA: Diagnosis not present

## 2023-09-01 DIAGNOSIS — I459 Conduction disorder, unspecified: Secondary | ICD-10-CM

## 2023-09-02 DIAGNOSIS — I459 Conduction disorder, unspecified: Secondary | ICD-10-CM | POA: Diagnosis not present

## 2023-09-15 DIAGNOSIS — H40053 Ocular hypertension, bilateral: Secondary | ICD-10-CM | POA: Diagnosis not present

## 2023-09-30 ENCOUNTER — Institutional Professional Consult (permissible substitution): Payer: Medicare Other | Admitting: Cardiology

## 2023-10-02 DIAGNOSIS — M79675 Pain in left toe(s): Secondary | ICD-10-CM | POA: Diagnosis not present

## 2023-10-02 DIAGNOSIS — M79674 Pain in right toe(s): Secondary | ICD-10-CM | POA: Diagnosis not present

## 2023-10-02 DIAGNOSIS — L110 Acquired keratosis follicularis: Secondary | ICD-10-CM | POA: Diagnosis not present

## 2023-10-02 DIAGNOSIS — I739 Peripheral vascular disease, unspecified: Secondary | ICD-10-CM | POA: Diagnosis not present

## 2023-10-02 DIAGNOSIS — M79672 Pain in left foot: Secondary | ICD-10-CM | POA: Diagnosis not present

## 2023-10-02 DIAGNOSIS — M79671 Pain in right foot: Secondary | ICD-10-CM | POA: Diagnosis not present

## 2023-10-10 DIAGNOSIS — Z87891 Personal history of nicotine dependence: Secondary | ICD-10-CM | POA: Diagnosis not present

## 2023-10-10 DIAGNOSIS — R1013 Epigastric pain: Secondary | ICD-10-CM | POA: Diagnosis not present

## 2023-10-10 DIAGNOSIS — Z85528 Personal history of other malignant neoplasm of kidney: Secondary | ICD-10-CM | POA: Diagnosis not present

## 2023-10-10 DIAGNOSIS — M353 Polymyalgia rheumatica: Secondary | ICD-10-CM | POA: Diagnosis not present

## 2023-10-10 DIAGNOSIS — Z905 Acquired absence of kidney: Secondary | ICD-10-CM | POA: Diagnosis not present

## 2023-10-10 DIAGNOSIS — K861 Other chronic pancreatitis: Secondary | ICD-10-CM | POA: Diagnosis not present

## 2023-10-10 DIAGNOSIS — I4510 Unspecified right bundle-branch block: Secondary | ICD-10-CM | POA: Diagnosis not present

## 2023-10-10 DIAGNOSIS — I4430 Unspecified atrioventricular block: Secondary | ICD-10-CM | POA: Diagnosis not present

## 2023-10-11 DIAGNOSIS — I4510 Unspecified right bundle-branch block: Secondary | ICD-10-CM | POA: Diagnosis not present

## 2023-10-11 DIAGNOSIS — I440 Atrioventricular block, first degree: Secondary | ICD-10-CM | POA: Diagnosis not present

## 2023-11-04 ENCOUNTER — Encounter: Payer: Self-pay | Admitting: Cardiology

## 2023-11-18 DIAGNOSIS — E782 Mixed hyperlipidemia: Secondary | ICD-10-CM | POA: Diagnosis not present

## 2023-11-18 DIAGNOSIS — E7849 Other hyperlipidemia: Secondary | ICD-10-CM | POA: Diagnosis not present

## 2023-11-18 DIAGNOSIS — K859 Acute pancreatitis without necrosis or infection, unspecified: Secondary | ICD-10-CM | POA: Diagnosis not present

## 2023-11-18 DIAGNOSIS — R7301 Impaired fasting glucose: Secondary | ICD-10-CM | POA: Diagnosis not present

## 2023-11-18 DIAGNOSIS — N1832 Chronic kidney disease, stage 3b: Secondary | ICD-10-CM | POA: Diagnosis not present

## 2023-11-24 DIAGNOSIS — R030 Elevated blood-pressure reading, without diagnosis of hypertension: Secondary | ICD-10-CM | POA: Diagnosis not present

## 2023-11-24 DIAGNOSIS — Z6826 Body mass index (BMI) 26.0-26.9, adult: Secondary | ICD-10-CM | POA: Diagnosis not present

## 2023-11-24 DIAGNOSIS — R55 Syncope and collapse: Secondary | ICD-10-CM | POA: Diagnosis not present

## 2023-11-24 DIAGNOSIS — R0789 Other chest pain: Secondary | ICD-10-CM | POA: Diagnosis not present

## 2023-11-24 DIAGNOSIS — R2689 Other abnormalities of gait and mobility: Secondary | ICD-10-CM | POA: Diagnosis not present

## 2023-11-24 DIAGNOSIS — E782 Mixed hyperlipidemia: Secondary | ICD-10-CM | POA: Diagnosis not present

## 2023-11-24 DIAGNOSIS — R7301 Impaired fasting glucose: Secondary | ICD-10-CM | POA: Diagnosis not present

## 2023-11-24 DIAGNOSIS — N1832 Chronic kidney disease, stage 3b: Secondary | ICD-10-CM | POA: Diagnosis not present

## 2023-11-24 NOTE — Progress Notes (Unsigned)
 Electrophysiology Office Note:   Date:  11/25/2023  ID:  Joshua Strong, DOB October 02, 1930, MRN [id number]  Primary Cardiologist: Parke Poisson, MD Primary Heart Failure: None Electrophysiologist: Nobie Putnam, MD      History of Present Illness:   Joshua Strong is a 89 y.o. male with h/o RBBB, 1AVB, syncope,GERD, renal cell carcinoma seen today for routine electrophysiology followup.   He was last seen in EP clinic by Dr. Jimmey Ralph in 08/2023 for syncope occurring over several years, frequent dizzy spells and falls which he attributes to dizziness.  He wore a cardiac monitor in 05/2022 which shoed no pauses, predominant rhythm was SB with RBBB & 1AVB. He continues to exercise 3x weekly.  He saw Dr. Jimmey Ralph and wore a cardiac monitor in 08/2023 which showed no significant bradycardia, brief episodes of NSVT.    Since last being seen in our clinic the patient reports he has developed chest pain and shortness of breath in the last week. He also continues to have dizziness which largely occurs with standing / position changes. He reports his PCP told him to start a low fat, low cholesterol diet due to his lipids.  The chest pain occurs at rest and with walking- resolves spontaneously.  He points to central chest. Denies acid reflux but does note "burping occasionally".  He never has other associated symptoms such as radiation to the neck or left arm, nausea or sense of impending doom.   He denies  palpitations, PND, orthopnea, nausea, vomiting, dizziness, syncope, edema, weight gain, or early satiety.   Review of systems complete and found to be negative unless listed in HPI.   EP Information / Studies Reviewed:    EKG is not ordered today. EKG from 08/22/23 reviewed which showed SR with 1st degree AVB, RBBB  EKG from PCP on 11/24/23 reviewed and no significant changes.       Studies:  ECHO 05/2022 > LVEF 65-65%, G1DD, normal RV size / function, LA mildly dilated, moderate MAC, no mitral stenosis,  aortic sclerosis without stenosis  Cardiac Monitor 05/2022 > SB with RBBB & 1AVB, HR 40-136 bpm with average of 64 bpm, no pauses   Cardiac Monitor 08/2023 > HR 51-182, ave 69 bpm, 22 nonsustained SVT (longest 8 beats) and 2 nonsustained VT (longest 4 beats), no AF, occasional supraventricular ectopy 1.1%, occasional ventricular ectopy 1.6%, no sustained arrhythmias, symptom trigger episodes correspond to SR with PAC's and PVC's   Arrhythmia / AAD 1AVB, RBBB        Physical Exam:   VS:  BP 120/62   Pulse 61   Ht 5\' 6"  (1.676 m)   Wt 167 lb (75.8 kg)   SpO2 93%   BMI 26.95 kg/m    Wt Readings from Last 3 Encounters:  11/25/23 167 lb (75.8 kg)  08/22/23 163 lb 6.4 oz (74.1 kg)  05/30/23 158 lb 12.8 oz (72 kg)     GEN: Well nourished, well developed in no acute distress NECK: No JVD; No carotid bruits CARDIAC: Regular rate and rhythm, no murmurs, rubs, gallops RESPIRATORY:  Clear to auscultation without rales, wheezing or rhonchi  ABDOMEN: Soft, non-tender, non-distended EXTREMITIES:  No edema; No deformity   ASSESSMENT AND PLAN:    Syncope  RBBB 1AVB  Asymptomatic Bradycardia Dizziness -repeat cardiac monitor as above  -symptom triggered episodes corresponded to SR with PAC's / PVC's -avoid AV nodal blockers  -no indication for PPM at this time -hx of cerebellar ataxia > query if this  is largest contributing factor with dizziness, follows with Dr. Marjory Lies with Neurology vs vestibular issues ?  Hypertension  -previously elevated in clinic, well controlled currently   Chest Pain, Shortness of Breath  -reviewed with DOD, Dr. Odis Hollingshead, will move forward with Lexi-Scan Stress test > pt elected not to have CT Coronary -due to bradycardia and dizziness, hold beta blocker and vasodilators at this time -CBC, BMP for procedure  -pt requests to schedule on M/Th/F due to adult care issues for his wife   Informed Consent   Shared Decision Making/Informed Consent The risks  [chest pain, shortness of breath, cardiac arrhythmias, dizziness, blood pressure fluctuations, myocardial infarction, stroke/transient ischemic attack, nausea, vomiting, allergic reaction, radiation exposure, metallic taste sensation and life-threatening complications (estimated to be 1 in 10,000)], benefits (risk stratification, diagnosing coronary artery disease, treatment guidance) and alternatives of a nuclear stress test were discussed in detail with Mr. Dippolito and he agrees to proceed.       Follow up with Dr. Jimmey Ralph in 3 months  Signed, Canary Brim, NP-C, AGACNP-BC [facility] - Electrophysiology  11/25/2023, 5:12 PM

## 2023-11-25 ENCOUNTER — Encounter: Payer: Self-pay | Admitting: Pulmonary Disease

## 2023-11-25 ENCOUNTER — Ambulatory Visit: Payer: Medicare Other | Attending: Pulmonary Disease | Admitting: Pulmonary Disease

## 2023-11-25 ENCOUNTER — Encounter: Payer: Self-pay | Admitting: *Deleted

## 2023-11-25 VITALS — BP 120/62 | HR 61 | Ht 66.0 in | Wt 167.0 lb

## 2023-11-25 DIAGNOSIS — I440 Atrioventricular block, first degree: Secondary | ICD-10-CM | POA: Diagnosis not present

## 2023-11-25 DIAGNOSIS — R072 Precordial pain: Secondary | ICD-10-CM | POA: Insufficient documentation

## 2023-11-25 DIAGNOSIS — I4510 Unspecified right bundle-branch block: Secondary | ICD-10-CM | POA: Diagnosis not present

## 2023-11-25 DIAGNOSIS — R001 Bradycardia, unspecified: Secondary | ICD-10-CM | POA: Insufficient documentation

## 2023-11-25 DIAGNOSIS — R55 Syncope and collapse: Secondary | ICD-10-CM | POA: Diagnosis not present

## 2023-11-25 NOTE — Patient Instructions (Signed)
 Medication Instructions:  Your physician recommends that you continue on your current medications as directed. Please refer to the Current Medication list given to you today.  *If you need a refill on your cardiac medications before your next appointment, please call your pharmacy*  Lab Work: BMET, CBC-TODAY If you have labs (blood work) drawn today and your tests are completely normal, you will receive your results only by: MyChart Message (if you have MyChart) OR A paper copy in the mail If you have any lab test that is abnormal or we need to change your treatment, we will call you to review the results.  Testing/Procedures: Your physician has requested that you have a lexiscan myoview. For further information please visit [email or web address] Please follow instruction sheet, as given.   Follow-Up: At [facility], you and your health needs are our priority.  As part of our continuing mission to provide you with exceptional heart care, we have created designated Provider Care Teams.  These Care Teams include your primary Cardiologist (physician) and Advanced Practice Providers (APPs -  Physician Assistants and Nurse Practitioners) who all work together to provide you with the care you need, when you need it.  Your next appointment:   1 month(s)  Provider:   Nobie Putnam, MD

## 2023-11-26 LAB — BASIC METABOLIC PANEL
BUN/Creatinine Ratio: 15 (ref 10–24)
BUN: 26 mg/dL (ref 10–36)
CO2: 22 mmol/L (ref 20–29)
Calcium: 10.4 mg/dL — ABNORMAL HIGH (ref 8.6–10.2)
Chloride: 103 mmol/L (ref 96–106)
Creatinine, Ser: 1.76 mg/dL — ABNORMAL HIGH (ref 0.76–1.27)
Glucose: 85 mg/dL (ref 70–99)
Potassium: 5.1 mmol/L (ref 3.5–5.2)
Sodium: 141 mmol/L (ref 134–144)
eGFR: 36 mL/min/{1.73_m2} — ABNORMAL LOW (ref >59)

## 2023-11-26 LAB — CBC
Hematocrit: 42.7 % (ref 37.5–51.0)
Hemoglobin: 14.4 g/dL (ref 13.0–17.7)
MCH: 30.3 pg (ref 26.6–33.0)
MCHC: 33.7 g/dL (ref 31.5–35.7)
MCV: 90 fL (ref 79–97)
Platelets: 238 10*3/uL (ref 150–450)
RBC: 4.75 x10E6/uL (ref 4.14–5.80)
RDW: 13.3 % (ref 11.6–15.4)
WBC: 6.9 10*3/uL (ref 3.4–10.8)

## 2023-12-05 ENCOUNTER — Telehealth (HOSPITAL_COMMUNITY): Payer: Self-pay | Admitting: *Deleted

## 2023-12-05 NOTE — Telephone Encounter (Signed)
 Left detailed instruction for mpi study on pt home vm per dpr.

## 2023-12-11 ENCOUNTER — Ambulatory Visit (HOSPITAL_COMMUNITY): Payer: Medicare Other | Attending: Internal Medicine

## 2023-12-11 DIAGNOSIS — R072 Precordial pain: Secondary | ICD-10-CM | POA: Diagnosis present

## 2023-12-11 LAB — MYOCARDIAL PERFUSION IMAGING
LV dias vol: 86 mL (ref 62–150)
LV sys vol: 38 mL
Nuc Stress EF: 55 %
Peak HR: 81 {beats}/min
Rest HR: 61 {beats}/min
Rest Nuclear Isotope Dose: 10.2 mCi
SDS: 1
SRS: 0
SSS: 1
ST Depression (mm): 0 mm
Stress Nuclear Isotope Dose: 32.6 mCi
TID: 0.91

## 2023-12-11 MED ORDER — TECHNETIUM TC 99M TETROFOSMIN IV KIT
32.6000 | PACK | Freq: Once | INTRAVENOUS | Status: AC | PRN
  Administered 2023-12-11: 32.6 via INTRAVENOUS

## 2023-12-11 MED ORDER — REGADENOSON 0.4 MG/5ML IV SOLN
0.4000 mg | Freq: Once | INTRAVENOUS | Status: AC
  Administered 2023-12-11: 0.4 mg via INTRAVENOUS

## 2023-12-11 MED ORDER — TECHNETIUM TC 99M TETROFOSMIN IV KIT
10.2000 | PACK | Freq: Once | INTRAVENOUS | Status: AC | PRN
  Administered 2023-12-11: 10.2 via INTRAVENOUS

## 2023-12-23 ENCOUNTER — Telehealth: Payer: Self-pay

## 2023-12-23 ENCOUNTER — Telehealth: Payer: Self-pay | Admitting: Physical Medicine and Rehabilitation

## 2023-12-23 DIAGNOSIS — Z85820 Personal history of malignant melanoma of skin: Secondary | ICD-10-CM | POA: Diagnosis not present

## 2023-12-23 DIAGNOSIS — L814 Other melanin hyperpigmentation: Secondary | ICD-10-CM | POA: Diagnosis not present

## 2023-12-23 DIAGNOSIS — L821 Other seborrheic keratosis: Secondary | ICD-10-CM | POA: Diagnosis not present

## 2023-12-23 NOTE — Telephone Encounter (Signed)
 Patient called and needs a back injection. He said if you could call after 3:30. CB#[phone]

## 2023-12-23 NOTE — Telephone Encounter (Signed)
 Last injection March 2024 % 100 of relief/function ability Duration of pain/ improvement-- 8 months Current pain score--7 Recent falls or injuries--yes bruise shoulder Location of pain lower back radiating to bilateral hips

## 2023-12-24 ENCOUNTER — Other Ambulatory Visit: Payer: Self-pay | Admitting: Physical Medicine and Rehabilitation

## 2023-12-24 DIAGNOSIS — M5416 Radiculopathy, lumbar region: Secondary | ICD-10-CM

## 2023-12-24 DIAGNOSIS — M48062 Spinal stenosis, lumbar region with neurogenic claudication: Secondary | ICD-10-CM

## 2023-12-25 DIAGNOSIS — M79672 Pain in left foot: Secondary | ICD-10-CM | POA: Diagnosis not present

## 2023-12-25 DIAGNOSIS — M79674 Pain in right toe(s): Secondary | ICD-10-CM | POA: Diagnosis not present

## 2023-12-25 DIAGNOSIS — M79671 Pain in right foot: Secondary | ICD-10-CM | POA: Diagnosis not present

## 2023-12-25 DIAGNOSIS — M79675 Pain in left toe(s): Secondary | ICD-10-CM | POA: Diagnosis not present

## 2023-12-25 DIAGNOSIS — I739 Peripheral vascular disease, unspecified: Secondary | ICD-10-CM | POA: Diagnosis not present

## 2023-12-25 DIAGNOSIS — L110 Acquired keratosis follicularis: Secondary | ICD-10-CM | POA: Diagnosis not present

## 2023-12-26 ENCOUNTER — Ambulatory Visit: Payer: Medicare Other | Admitting: Cardiovascular Disease

## 2023-12-26 ENCOUNTER — Ambulatory Visit
Admission: EM | Admit: 2023-12-26 | Discharge: 2023-12-26 | Disposition: A | Discharge status: Home / Self Care | Attending: Nurse Practitioner | Admitting: Nurse Practitioner

## 2023-12-26 DIAGNOSIS — J069 Acute upper respiratory infection, unspecified: Secondary | ICD-10-CM | POA: Diagnosis not present

## 2023-12-26 LAB — POC COVID19/FLU A&B COMBO
Covid Antigen, POC: NEGATIVE
Influenza A Antigen, POC: NEGATIVE
Influenza B Antigen, POC: NEGATIVE

## 2023-12-26 MED ORDER — BENZONATATE 100 MG PO CAPS: 100.0000 mg | ORAL_CAPSULE | Freq: Three times a day (TID) | ORAL | 0 refills | Status: DC | PRN

## 2023-12-26 NOTE — ED Provider Notes (Signed)
 [facility]    CSN: [id number] Arrival date & time: 12/26/23  1058      History   Chief Complaint No chief complaint on file.   HPI Joshua Strong is a 89 y.o. male.   Patient presents today with 3-day history of dry cough, shortness of breath, chest pain in his sternum radiating to his back when he coughs and after coughing, runny nose, sore throat, headache, intermittent right ear pain, soft stools, and fatigue.  He denies fever, body aches or chills, stuffy nose, abdominal pain, nausea/vomiting, diarrhea, and decreased appetite.  Has been taking over-the-counter Claritin for symptoms without much improvement.  No known sick contacts.    Past Medical History:  Diagnosis Date   Arthritis    Dizziness    GERD (gastroesophageal reflux disease)    GI bleed    ulcer   Hemorrhoid    Memory loss    Pancreatitis    Pneumonia    Renal cell carcinoma (HCC)    Syncope    Tremor    Tubular adenoma 2013    Patient Active Problem List   Diagnosis Date Noted   Pain in left wrist 03/28/2021   Cerebellar ataxia (HCC) 11/15/2020   Balance disorder 11/15/2020   Spinal stenosis of lumbar region with neurogenic claudication 09/10/2019   Lumbar radiculopathy 09/10/2019   Presence of right artificial knee joint 05/20/2017   Unilateral primary osteoarthritis, left knee 05/20/2017   History of total right knee replacement 02/17/2017   Chronic pain of left knee 02/17/2017   Osteoarthritis of right knee 05/24/2016   Status post total right knee replacement 05/24/2016   Diverticulosis of colon without hemorrhage    Constipation 04/19/2010   ABDOMINAL PAIN, LEFT LOWER QUADRANT 09/18/2009   EPIGASTRIC PAIN 09/18/2009   Malignant neoplasm of kidney excluding renal pelvis (HCC) 08/08/2008   PNEUMONIA 08/08/2008   ACUTE PANCREATITIS 08/08/2008   NAUSEA 08/08/2008   Abdominal pain 08/08/2008    Past Surgical History:  Procedure Laterality Date   APPENDECTOMY  1975   BACK  SURGERY     1961, 1963, 1965   CHOLECYSTECTOMY  1975   COLONOSCOPY  06/16/2007   Dr. Jena Gauss- minimal internal hemorrhoids o/w normal rectum,tubular adenoma   COLONOSCOPY  07/02/2012   Dr.Rourk- normal rectum, 3 5-7 mm peduncuated polyos, one in the mid sigmoid segment and 2 in the ascending segment. remainder of the colonic mucosa appeared normal. bx= tubular adenoma   COLONOSCOPY N/A 07/17/2015   Procedure: COLONOSCOPY;  Surgeon: Corbin Ade, MD;  Location: [facility];  Service: Endoscopy;  Laterality: N/A;  730    EYE SURGERY     cataract with lens implants   HAND SURGERY Right 1989, 1991   KNEE SURGERY Right 2000   NEPHRECTOMY Right 2000   pancreatic duct stent     mult times   SHOULDER SURGERY Left 09/16/2011   SPINAL FUSION     testicular tumor  1989   TOTAL KNEE ARTHROPLASTY Right 05/24/2016   Procedure: RIGHT TOTAL KNEE ARTHROPLASTY;  Surgeon: Kathryne Hitch, MD;  Location: [facility];  Service: Orthopedics;  Laterality: Right;       Home Medications    Prior to Admission medications   Medication Sig Start Date End Date Taking? Authorizing Provider  benzonatate (TESSALON) 100 MG capsule Take 1 capsule (100 mg total) by mouth 3 (three) times daily as needed for cough. Do not take with alcohol or while operating or driving heavy machinery 7/82/95  Yes Cathlean Marseilles A, NP  acetaminophen (TYLENOL) 650 MG CR tablet Take 650 mg by mouth every 8 (eight) hours as needed for pain (arthritis).    [provider]  Calcium Carbonate (CALTRATE 600 PO) Take 1 tablet by mouth daily.    [provider]  denosumab (PROLIA) 60 MG/ML SOSY injection Inject 60 mg into the skin every 6 (six) months.    [provider]  diazepam (VALIUM) 5 MG tablet Take one tablet by mouth with food one hour prior to procedure. May repeat 30 minutes prior if needed. 12/13/22   Juanda Chance, NP  diclofenac Sodium (VOLTAREN) 1 % GEL Apply topically. 04/08/18   [provider]  finasteride (PROSCAR) 5 MG tablet Take 5 mg by mouth daily.    [provider]  fluticasone (FLONASE) 50 MCG/ACT nasal spray Place 1 spray into both nostrils daily as needed for allergies or rhinitis. Patient not taking: Reported on 11/25/2023    [provider]  meclizine (ANTIVERT) 12.5 MG tablet Take 12.5 mg by mouth 3 (three) times daily as needed for dizziness. Patient not taking: Reported on 11/25/2023    [provider]  Multiple Vitamin (MULTIVITAMIN WITH MINERALS) TABS tablet Take 1 tablet by mouth daily.    [provider]  OVER THE COUNTER MEDICATION Super Beta Prostate twice daily Patient not taking: Reported on 11/25/2023    [provider]  pantoprazole (PROTONIX) 40 MG tablet Take 40 mg by mouth 2 (two) times daily. 07/20/22   [provider]  Polyethyl Glycol-Propyl Glycol (SYSTANE FREE OP) Apply 1-2 drops to eye daily. Both eyes    [provider]    Family History Family History  Adopted: Yes  Problem Relation Age of Onset   Cancer Mother     Social History Social History   Tobacco Use   Smoking status: Former    Current packs/day: 0.00    Average packs/day: 1 pack/day for 3.0 years (3.0 ttl pk-yrs)    Types: Cigarettes    Start date: 05/17/1953    Quit date: 05/17/1956    Years since quitting: 67.6   Smokeless tobacco: Never   Tobacco comments:    quit in 1958  Vaping Use   Vaping status: Never Used  Substance Use Topics   Alcohol use: No    Comment: 08/11/19 "too much for 6 yrs", quit in 1958   Drug use: No     Allergies   Morphine and codeine and Metoclopramide hcl   Review of Systems Review of Systems Per HPI  Physical Exam Triage Vital Signs ED Triage Vitals  Encounter Vitals Group     BP 12/26/23 1128 (!) 151/79     Systolic BP Percentile --      Diastolic BP Percentile --      Pulse Rate 12/26/23 1128 62     Resp 12/26/23 1128 18     Temp 12/26/23 1128 97.6 F  (36.4 C)     Temp Source 12/26/23 1128 Oral     SpO2 12/26/23 1128 96 %     Weight --      Height --      Head Circumference --      Peak Flow --      Pain Score 12/26/23 1127 5     Pain Loc --      Pain Education --      Exclude from Growth Chart --    No data found.  Updated Vital  Signs BP (!) 151/79 (BP Location: Right Arm)   Pulse 62   Temp 97.6 F (36.4 C) (Oral)   Resp 18   SpO2 96%   Visual Acuity Right Eye Distance:   Left Eye Distance:   Bilateral Distance:    Right Eye Near:   Left Eye Near:    Bilateral Near:     Physical Exam Vitals and nursing note reviewed.  Constitutional:      General: He is not in acute distress.    Appearance: Normal appearance. He is not ill-appearing or toxic-appearing.  HENT:     Head: Normocephalic and atraumatic.     Right Ear: Tympanic membrane, ear canal and external ear normal.     Left Ear: Tympanic membrane, ear canal and external ear normal.     Nose: No congestion or rhinorrhea.     Mouth/Throat:     Mouth: Mucous membranes are moist.     Pharynx: Oropharynx is clear. Posterior oropharyngeal erythema present. No oropharyngeal exudate.     Comments: Significant post nasal drainage Eyes:     General: No scleral icterus.    Extraocular Movements: Extraocular movements intact.  Cardiovascular:     Rate and Rhythm: Normal rate and regular rhythm.  Pulmonary:     Effort: Pulmonary effort is normal. No respiratory distress.     Breath sounds: Normal breath sounds. No wheezing, rhonchi or rales.  Musculoskeletal:     Cervical back: Normal range of motion and neck supple.  Lymphadenopathy:     Cervical: No cervical adenopathy.  Skin:    General: Skin is warm and dry.     Coloration: Skin is not jaundiced or pale.     Findings: No erythema or rash.  Neurological:     Mental Status: He is alert and oriented to person, place, and time.  Psychiatric:        Behavior: Behavior is cooperative.      UC Treatments /  Results  Labs (all labs ordered are listed, but only abnormal results are displayed) Labs Reviewed  POC COVID19/FLU A&B COMBO    EKG   Radiology No results found.  Procedures Procedures (including critical care time)  Medications Ordered in UC Medications - No data to display  Initial Impression / Assessment and Plan / UC Course  I have reviewed the triage vital signs and the nursing notes.  Pertinent labs & imaging results that were available during my care of the patient were reviewed by me and considered in my medical decision making (see chart for details).   Patient is well-appearing, normotensive, afebrile, not tachycardic, not tachypneic, oxygenating well on room air.    1. Viral URI with cough Vitals and exam are reassuring today Suspect viral etiology COVID-19, influenza testing is negative Supportive care discussed with patient, start cough suppressant medicine ER and return precautions discussed  The patient was given the opportunity to ask questions.  All questions answered to their satisfaction.  The patient is in agreement to this plan.    Final Clinical Impressions(s) / UC Diagnoses   Final diagnoses:  Viral URI with cough     Discharge Instructions      You have a viral upper respiratory infection.  Symptoms should improve over the next week to 10 days.  If you develop chest pain or shortness of breath, go to the emergency room.  COVID-19 and influenza tests are negative today.   Some things that can make you feel better are: - Increased rest -  Increasing fluid with water/sugar free electrolytes - Acetaminophen as needed for fever/pain - Salt water gargling, chloraseptic spray and throat lozenges for sore throat - Flonase nasal spray for sore throat and post nasal drainage - OTC guaifenesin (Mucinex) 600 mg twice daily for congestion - Saline sinus flushes or a neti pot for congestion - Humidifying the air -Tessalon Perles every 8 hours as  needed for dry cough      ED Prescriptions     Medication Sig Dispense Auth. Provider   benzonatate (TESSALON) 100 MG capsule Take 1 capsule (100 mg total) by mouth 3 (three) times daily as needed for cough. Do not take with alcohol or while operating or driving heavy machinery 21 capsule Valentino Nose, NP      PDMP not reviewed this encounter.   Valentino Nose, NP 12/26/23 1401

## 2023-12-26 NOTE — ED Triage Notes (Signed)
 Pt reports he has been coughing, has chest congestion, headache, right side chest pain that radiates to his back x 3 days

## 2023-12-26 NOTE — Discharge Instructions (Signed)
 You have a viral upper respiratory infection.  Symptoms should improve over the next week to 10 days.  If you develop chest pain or shortness of breath, go to the emergency room.  COVID-19 and influenza tests are negative today.   Some things that can make you feel better are: - Increased rest - Increasing fluid with water/sugar free electrolytes - Acetaminophen as needed for fever/pain - Salt water gargling, chloraseptic spray and throat lozenges for sore throat - Flonase nasal spray for sore throat and post nasal drainage - OTC guaifenesin (Mucinex) 600 mg twice daily for congestion - Saline sinus flushes or a neti pot for congestion - Humidifying the air -Tessalon Perles every 8 hours as needed for dry cough

## 2023-12-29 ENCOUNTER — Emergency Department (HOSPITAL_COMMUNITY)

## 2023-12-29 ENCOUNTER — Other Ambulatory Visit: Payer: Self-pay

## 2023-12-29 ENCOUNTER — Emergency Department (HOSPITAL_COMMUNITY)
Admission: EM | Admit: 2023-12-29 | Discharge: 2023-12-29 | Disposition: A | Discharge status: Home / Self Care | Attending: Emergency Medicine | Admitting: Emergency Medicine

## 2023-12-29 ENCOUNTER — Encounter (HOSPITAL_COMMUNITY): Payer: Self-pay | Admitting: *Deleted

## 2023-12-29 DIAGNOSIS — R079 Chest pain, unspecified: Secondary | ICD-10-CM | POA: Diagnosis not present

## 2023-12-29 DIAGNOSIS — R059 Cough, unspecified: Secondary | ICD-10-CM | POA: Insufficient documentation

## 2023-12-29 DIAGNOSIS — K2100 Gastro-esophageal reflux disease with esophagitis, without bleeding: Secondary | ICD-10-CM | POA: Diagnosis not present

## 2023-12-29 DIAGNOSIS — R42 Dizziness and giddiness: Secondary | ICD-10-CM | POA: Insufficient documentation

## 2023-12-29 DIAGNOSIS — R030 Elevated blood-pressure reading, without diagnosis of hypertension: Secondary | ICD-10-CM

## 2023-12-29 DIAGNOSIS — I4510 Unspecified right bundle-branch block: Secondary | ICD-10-CM | POA: Diagnosis not present

## 2023-12-29 DIAGNOSIS — R29818 Other symptoms and signs involving the nervous system: Secondary | ICD-10-CM | POA: Diagnosis not present

## 2023-12-29 DIAGNOSIS — N400 Enlarged prostate without lower urinary tract symptoms: Secondary | ICD-10-CM | POA: Diagnosis not present

## 2023-12-29 DIAGNOSIS — E782 Mixed hyperlipidemia: Secondary | ICD-10-CM | POA: Diagnosis not present

## 2023-12-29 DIAGNOSIS — R519 Headache, unspecified: Secondary | ICD-10-CM | POA: Diagnosis not present

## 2023-12-29 DIAGNOSIS — R0689 Other abnormalities of breathing: Secondary | ICD-10-CM | POA: Diagnosis not present

## 2023-12-29 DIAGNOSIS — G9389 Other specified disorders of brain: Secondary | ICD-10-CM | POA: Diagnosis not present

## 2023-12-29 DIAGNOSIS — I10 Essential (primary) hypertension: Secondary | ICD-10-CM | POA: Insufficient documentation

## 2023-12-29 LAB — CBC WITH DIFFERENTIAL/PLATELET
Abs Immature Granulocytes: 0.04 10*3/uL (ref 0.00–0.07)
Basophils Absolute: 0 10*3/uL (ref 0.0–0.1)
Basophils Relative: 0 %
Eosinophils Absolute: 0 10*3/uL (ref 0.0–0.5)
Eosinophils Relative: 0 %
HCT: 42.5 % (ref 39.0–52.0)
Hemoglobin: 14.6 g/dL (ref 13.0–17.0)
Immature Granulocytes: 0 %
Lymphocytes Relative: 13 %
Lymphs Abs: 1.4 10*3/uL (ref 0.7–4.0)
MCH: 30.5 pg (ref 26.0–34.0)
MCHC: 34.4 g/dL (ref 30.0–36.0)
MCV: 88.7 fL (ref 80.0–100.0)
Monocytes Absolute: 0.7 10*3/uL (ref 0.1–1.0)
Monocytes Relative: 7 %
Neutro Abs: 8.5 10*3/uL — ABNORMAL HIGH (ref 1.7–7.7)
Neutrophils Relative %: 80 %
Platelets: 224 10*3/uL (ref 150–400)
RBC: 4.79 MIL/uL (ref 4.22–5.81)
RDW: 13.4 % (ref 11.5–15.5)
WBC: 10.8 10*3/uL — ABNORMAL HIGH (ref 4.0–10.5)
nRBC: 0 % (ref 0.0–0.2)

## 2023-12-29 LAB — COMPREHENSIVE METABOLIC PANEL WITH GFR
ALT: 20 U/L (ref 0–44)
AST: 39 U/L (ref 15–41)
Albumin: 4.2 g/dL (ref 3.5–5.0)
Alkaline Phosphatase: 41 U/L (ref 38–126)
Anion gap: 12 (ref 5–15)
BUN: 24 mg/dL — ABNORMAL HIGH (ref 8–23)
CO2: 22 mmol/L (ref 22–32)
Calcium: 10 mg/dL (ref 8.9–10.3)
Chloride: 104 mmol/L (ref 98–111)
Creatinine, Ser: 1.55 mg/dL — ABNORMAL HIGH (ref 0.61–1.24)
GFR, Estimated: 42 mL/min — ABNORMAL LOW (ref >60)
Glucose, Bld: 122 mg/dL — ABNORMAL HIGH (ref 70–99)
Potassium: 3.8 mmol/L (ref 3.5–5.1)
Sodium: 138 mmol/L (ref 135–145)
Total Bilirubin: 1 mg/dL (ref 0.0–1.2)
Total Protein: 7.6 g/dL (ref 6.5–8.1)

## 2023-12-29 LAB — URINALYSIS, ROUTINE W REFLEX MICROSCOPIC
Bilirubin Urine: NEGATIVE
Glucose, UA: NEGATIVE mg/dL
Hgb urine dipstick: NEGATIVE
Ketones, ur: NEGATIVE mg/dL
Leukocytes,Ua: NEGATIVE
Nitrite: NEGATIVE
Protein, ur: NEGATIVE mg/dL
Specific Gravity, Urine: 1.013 (ref 1.005–1.030)
pH: 6 (ref 5.0–8.0)

## 2023-12-29 LAB — RESP PANEL BY RT-PCR (RSV, FLU A&B, COVID)  RVPGX2
Influenza A by PCR: NEGATIVE
Influenza B by PCR: NEGATIVE
Resp Syncytial Virus by PCR: NEGATIVE
SARS Coronavirus 2 by RT PCR: NEGATIVE

## 2023-12-29 LAB — TROPONIN I (HIGH SENSITIVITY)
Troponin I (High Sensitivity): 21 ng/L — ABNORMAL HIGH (ref <18)
Troponin I (High Sensitivity): 22 ng/L — ABNORMAL HIGH (ref <18)

## 2023-12-29 LAB — CBG MONITORING, ED: Glucose-Capillary: 129 mg/dL — ABNORMAL HIGH (ref 70–99)

## 2023-12-29 MED ORDER — LACTATED RINGERS IV BOLUS
500.0000 mL | Freq: Once | INTRAVENOUS | Status: AC
  Administered 2023-12-29: 500 mL via INTRAVENOUS

## 2023-12-29 NOTE — ED Notes (Signed)
 Pt ambulated with assistance; unbalanced and shuffling when walking. Family at bedside said this is baseline for pt.

## 2023-12-29 NOTE — ED Notes (Signed)
 Pt stated, "the tingling in my mouth comes and goes. Earlier I felt it in my throat but now it is in the roof of my mouth."

## 2023-12-29 NOTE — ED Triage Notes (Addendum)
 Pt brought in by [facility] from home with c/o chest pain that started this morning along with dry mouth, headache, and dizziness that started 3 days ago. He was also started on Benzonatate 3 days ago by UC and wonders if the symptoms are related to the medication.

## 2023-12-29 NOTE — ED Provider Notes (Signed)
 [place] [facility] AT [facility] Provider Note   CSN: [id number] Arrival date & time: 12/29/23  8295     History  Chief Complaint  Patient presents with   Chest Pain    Joshua Strong is a 89 y.o. male.  HPI 89 year old male presents with concern for side effects of Tessalon Perles.  He has had a few days of cough and URI symptoms.  Went to urgent care on 3/28 and was prescribed Tessalon Perles.  He last took them last night.  He has developed some dizziness and headache yesterday and then this morning noticed some hallucinations where he thought he was seeing a pile of pipes that was not there in his house. He saw the same pile later in a different room.  He feels lightheaded and has a right-sided headache.  The headache is relatively mild.  The cough has continued.  He is also having a little bit of right-sided chest pain that started this morning.  No focal weakness or numbness.  His mouth feels dry.  He continues to have a sore throat.  Home Medications Prior to Admission medications   Medication Sig Start Date End Date Taking? Authorizing Provider  acetaminophen (TYLENOL) 650 MG CR tablet Take 650 mg by mouth every 8 (eight) hours as needed for pain (arthritis).    [provider]  Calcium Carbonate (CALTRATE 600 PO) Take 1 tablet by mouth daily.    [provider]  denosumab (PROLIA) 60 MG/ML SOSY injection Inject 60 mg into the skin every 6 (six) months.    [provider]  diazepam (VALIUM) 5 MG tablet Take one tablet by mouth with food one hour prior to procedure. May repeat 30 minutes prior if needed. 12/13/22   Juanda Chance, NP  diclofenac Sodium (VOLTAREN) 1 % GEL Apply topically. 04/08/18   [provider]  finasteride (PROSCAR) 5 MG tablet Take 5 mg by mouth daily.    [provider]  fluticasone (FLONASE) 50 MCG/ACT nasal spray Place 1 spray into both nostrils daily as needed for allergies or  rhinitis. Patient not taking: Reported on 11/25/2023    [provider]  meclizine (ANTIVERT) 12.5 MG tablet Take 12.5 mg by mouth 3 (three) times daily as needed for dizziness. Patient not taking: Reported on 11/25/2023    [provider]  Multiple Vitamin (MULTIVITAMIN WITH MINERALS) TABS tablet Take 1 tablet by mouth daily.    [provider]  OVER THE COUNTER MEDICATION Super Beta Prostate twice daily Patient not taking: Reported on 11/25/2023    [provider]  pantoprazole (PROTONIX) 40 MG tablet Take 40 mg by mouth 2 (two) times daily. 07/20/22   [provider]  Polyethyl Glycol-Propyl Glycol (SYSTANE FREE OP) Apply 1-2 drops to eye daily. Both eyes    [provider]      Allergies    Morphine and codeine and Metoclopramide hcl    Review of Systems   Review of Systems  Constitutional:  Negative for fever.  Respiratory:  Positive for cough.   Cardiovascular:  Positive for chest pain.  Gastrointestinal:  Negative for vomiting.  Neurological:  Positive for dizziness and headaches. Negative for weakness and numbness.    Physical Exam Updated Vital Signs BP (!) 178/80   Pulse (!) 59   Temp 98.7 F (37.1 C) (Oral)   Resp 14   Ht 5\' 6"  (1.676 m)   Wt 75.8 kg   SpO2 95%  BMI 26.97 kg/m  Physical Exam Vitals and nursing note reviewed.  Constitutional:      Appearance: He is well-developed.  HENT:     Head: Normocephalic and atraumatic.  Eyes:     Extraocular Movements: Extraocular movements intact.     Pupils: Pupils are equal, round, and reactive to light.  Cardiovascular:     Rate and Rhythm: Normal rate and regular rhythm.     Heart sounds: Normal heart sounds.  Pulmonary:     Effort: Pulmonary effort is normal.     Breath sounds: Normal breath sounds.  Chest:     Chest wall: Tenderness (mild, right anterior chest) present.  Abdominal:     Palpations: Abdomen is soft.     Tenderness: There is no abdominal  tenderness.  Skin:    General: Skin is warm and dry.  Neurological:     Mental Status: He is alert.     Comments: CN 3-12 grossly intact. 5/5 strength in all 4 extremities. Grossly normal sensation. Normal finger to nose.      ED Results / Procedures / Treatments   Labs (all labs ordered are listed, but only abnormal results are displayed) Labs Reviewed  COMPREHENSIVE METABOLIC PANEL WITH GFR - Abnormal; Notable for the following components:      Result Value   Glucose, Bld 122 (*)    BUN 24 (*)    Creatinine, Ser 1.55 (*)    GFR, Estimated 42 (*)    All other components within normal limits  CBC WITH DIFFERENTIAL/PLATELET - Abnormal; Notable for the following components:   WBC 10.8 (*)    Neutro Abs 8.5 (*)    All other components within normal limits  CBG MONITORING, ED - Abnormal; Notable for the following components:   Glucose-Capillary 129 (*)    All other components within normal limits  TROPONIN I (HIGH SENSITIVITY) - Abnormal; Notable for the following components:   Troponin I (High Sensitivity) 21 (*)    All other components within normal limits  TROPONIN I (HIGH SENSITIVITY) - Abnormal; Notable for the following components:   Troponin I (High Sensitivity) 22 (*)    All other components within normal limits  RESP PANEL BY RT-PCR (RSV, FLU A&B, COVID)  [id number]  URINALYSIS, ROUTINE W REFLEX MICROSCOPIC    EKG EKG Interpretation Date/Time:  Monday December 29 2023 09:40:10 EDT Ventricular Rate:  69 PR Interval:  227 QRS Duration:  155 QT Interval:  410 QTC Calculation: 440 R Axis:   41  Text Interpretation: Sinus rhythm Prolonged PR interval Right bundle branch block Confirmed by Pricilla Loveless [phone]) on 12/29/2023 10:13:17 AM  Radiology MR BRAIN WO CONTRAST Result Date: 12/29/2023 CLINICAL DATA:  89 year old male with neurologic deficit. Headache and dizziness. EXAM: MRI HEAD WITHOUT CONTRAST TECHNIQUE: Multiplanar, multiecho pulse sequences of the brain and  surrounding structures were obtained without intravenous contrast. COMPARISON:  Head CT this morning.  Brain MRI 08/28/2019. FINDINGS: Brain: Cerebral volume is within normal limits for age. No restricted diffusion to suggest acute infarction. No midline shift, mass effect, evidence of mass lesion, ventriculomegaly, extra-axial collection or acute intracranial hemorrhage. Cervicomedullary junction and pituitary are within normal limits. Scattered and patchy although mild for age bilateral cerebral white matter T2 and FLAIR hyperintensity in a nonspecific configuration. No cortical encephalomalacia. No chronic cerebral blood products on SWI. Deep gray nuclei, brainstem and cerebellum appear negative. Vascular: Major intracranial vascular flow voids are stable, preserved. Skull and upper cervical spine: Normal for age visible cervical  spine, bone marrow signal. Sinuses/Orbits: Chronic postoperative changes to the globes. Paranasal sinuses and mastoids are stable and well aerated. Other: Visible internal auditory structures appear normal. Negative visible scalp and face. IMPRESSION: No acute intracranial abnormality. Essentially normal for age noncontrast MRI appearance of the brain. Electronically Signed   By: Odessa Fleming M.D.   On: 12/29/2023 12:39   DG Chest 2 View Result Date: 12/29/2023 CLINICAL DATA:  89 year old male with chest pain, headache and dizziness. Started a new medication 3 days ago. EXAM: CHEST - 2 VIEW COMPARISON:  Portable chest 08/24/2022 and earlier. FINDINGS: PA and lateral views 1019 hours. Normal lung volumes and mediastinal contours. Visualized tracheal air column is within normal limits. Both lungs appear stable and clear. No pneumothorax or pleural effusion. Widespread thoracic interbody ankylosis suspected from flowing endplate osteophytes, chronic. Chronic right AC joint degeneration. No acute osseous abnormality identified. Chronic surgical clips in the right abdomen. Negative visible  bowel gas. IMPRESSION: Negative for age, no acute cardiopulmonary abnormality. Electronically Signed   By: Odessa Fleming M.D.   On: 12/29/2023 10:41   CT Head Wo Contrast Result Date: 12/29/2023 CLINICAL DATA:  89 year old male with chest pain, headache and dizziness. Started a new medication 3 days ago. EXAM: CT HEAD WITHOUT CONTRAST TECHNIQUE: Contiguous axial images were obtained from the base of the skull through the vertex without intravenous contrast. RADIATION DOSE REDUCTION: This exam was performed according to the departmental dose-optimization program which includes automated exposure control, adjustment of the mA and/or kV according to patient size and/or use of iterative reconstruction technique. COMPARISON:  Brain MRI 08/28/2019.  Head CT 08/24/2022. FINDINGS: Brain: Chronic asymmetric tentorial calcification greater on the left, normal variant. Cerebral volume is within normal limits for age. No midline shift, ventriculomegaly, mass effect, evidence of mass lesion, intracranial hemorrhage or evidence of cortically based acute infarction. Normal for age gray-white differentiation. Vascular: Calcified atherosclerosis at the skull base. No suspicious intracranial vascular hyperdensity. Skull: Intact, negative. Sinuses/Orbits: Tympanic cavities, Visualized paranasal sinuses and mastoids are clear. Other: No acute orbit or scalp soft tissue finding. IMPRESSION: Normal for age noncontrast Head CT. Electronically Signed   By: Odessa Fleming M.D.   On: 12/29/2023 10:40    Procedures Procedures    Medications Ordered in ED Medications  lactated ringers bolus 500 mL (0 mLs Intravenous Stopped 12/29/23 1141)    ED Course/ Medical Decision Making/ A&P                                 Medical Decision Making Amount and/or Complexity of Data Reviewed Labs: ordered.    Details: Troponins flat and minimally elevated at 21 and 22.  Creatinine similar to baseline. Radiology: ordered.    Details: No stroke on  MRI. ECG/medicine tests: ordered and independent interpretation performed.    Details: No ischemia   Patient presents with dizziness.  Probably a reaction to his [employer].  No other clear cause including negative MRI, UA.  No signs of a bacterial infection at this time.  He is able to ambulate at his baseline.  He is not currently hallucinating.  [employer] confirms that a lot of his symptoms could be from the [employer].  He is otherwise hypertensive and I did discuss this with him and family.  He states he has never had this problem and does not take any meds for it.  He would not like to start meds at  this time and would prefer to follow-up closely with PCP and check this at home.  I think this is reasonable but counseled on close follow-up and will give return precautions.        Final Clinical Impression(s) / ED Diagnoses Final diagnoses:  Dizziness  Elevated blood pressure reading    Rx / DC Orders ED Discharge Orders     None         Pricilla Loveless, MD 12/29/23 1456

## 2023-12-29 NOTE — Discharge Instructions (Addendum)
 Do NOT take the Benzonatate/Tessalon any more.  This was causing your symptoms.  Your blood pressure is also elevated today, it is unclear if this is related to your symptoms but you need to get this rechecked both at home as well as closely with your doctor.  If you develop new or worsening dizziness, headache, chest pain, or any other new/concerning symptoms then return to the ER or call 911.

## 2023-12-29 NOTE — ED Notes (Signed)
 Pt transported to MRI

## 2023-12-31 DIAGNOSIS — J301 Allergic rhinitis due to pollen: Secondary | ICD-10-CM | POA: Diagnosis not present

## 2023-12-31 DIAGNOSIS — R55 Syncope and collapse: Secondary | ICD-10-CM | POA: Diagnosis not present

## 2023-12-31 DIAGNOSIS — N1832 Chronic kidney disease, stage 3b: Secondary | ICD-10-CM | POA: Diagnosis not present

## 2023-12-31 DIAGNOSIS — Z6826 Body mass index (BMI) 26.0-26.9, adult: Secondary | ICD-10-CM | POA: Diagnosis not present

## 2023-12-31 DIAGNOSIS — R2689 Other abnormalities of gait and mobility: Secondary | ICD-10-CM | POA: Diagnosis not present

## 2024-01-06 ENCOUNTER — Ambulatory Visit: Admitting: Physical Medicine and Rehabilitation

## 2024-01-06 ENCOUNTER — Other Ambulatory Visit: Payer: Self-pay

## 2024-01-06 VITALS — BP 169/79 | HR 57

## 2024-01-06 DIAGNOSIS — M5416 Radiculopathy, lumbar region: Secondary | ICD-10-CM

## 2024-01-06 MED ORDER — METHYLPREDNISOLONE ACETATE 40 MG/ML IJ SUSP
40.0000 mg | Freq: Once | INTRAMUSCULAR | Status: AC
  Administered 2024-01-06: 40 mg

## 2024-01-06 NOTE — Progress Notes (Unsigned)
 Pain Scale   Average Pain 4 Patient advising his pain increases with walking and sometimes with sitting ant it radiates to right side(leg). Patient advises that when he reclines backwards the pain lessens        +Driver, -BT, -Dye Allergies.

## 2024-01-06 NOTE — Patient Instructions (Signed)

## 2024-01-07 NOTE — Procedures (Signed)
 Lumbosacral Transforaminal Epidural Steroid Injection - Sub-Pedicular Approach with Fluoroscopic Guidance  Patient: Joshua Strong      Date of Birth: Apr 12, 1931 MRN: [id number] PCP: Estanislado Pandy, MD      Visit Date: 01/06/2024   Universal Protocol:    Date/Time: 01/06/2024  Consent Given By: the patient  Position: PRONE  Additional Comments: Vital signs were monitored before and after the procedure. Patient was prepped and draped in the usual sterile fashion. The correct patient, procedure, and site was verified.   Injection Procedure Details:   Procedure diagnoses: Lumbar radiculopathy [M54.16]    Meds Administered:  Meds ordered this encounter  Medications   methylPREDNISolone acetate (DEPO-MEDROL) injection 40 mg    Laterality: Bilateral  Location/Site: L4  Needle:5.0 in., 22 ga.  Short bevel or Quincke spinal needle  Needle Placement: Transforaminal  Findings:    -Comments: Excellent flow of contrast along the nerve, nerve root and into the epidural space.  Procedure Details: After squaring off the end-plates to get a true AP view, the C-arm was positioned so that an oblique view of the foramen as noted above was visualized. The target area is just inferior to the "nose of the scotty dog" or sub pedicular. The soft tissues overlying this structure were infiltrated with 2-3 ml. of 1% Lidocaine without Epinephrine.  The spinal needle was inserted toward the target using a "trajectory" view along the fluoroscope beam.  Under AP and lateral visualization, the needle was advanced so it did not puncture dura and was located close the 6 O'Clock position of the pedical in AP tracterory. Biplanar projections were used to confirm position. Aspiration was confirmed to be negative for CSF and/or blood. A 1-2 ml. volume of Isovue-250 was injected and flow of contrast was noted at each level. Radiographs were obtained for documentation purposes.   After attaining the desired flow  of contrast documented above, a 0.5 to 1.0 ml test dose of 0.25% Marcaine was injected into each respective transforaminal space.  The patient was observed for 90 seconds post injection.  After no sensory deficits were reported, and normal lower extremity motor function was noted,   the above injectate was administered so that equal amounts of the injectate were placed at each foramen (level) into the transforaminal epidural space.   Additional Comments:  No complications occurred Dressing: 2 x 2 sterile gauze and Band-Aid    Post-procedure details: Patient was observed during the procedure. Post-procedure instructions were reviewed.  Patient left the clinic in stable condition.

## 2024-01-07 NOTE — Progress Notes (Signed)
 Joshua Strong - 89 y.o. male MRN [id number]  Date of birth: June 29, 1931  Office Visit Note: Visit Date: 01/06/2024 PCP: Estanislado Pandy, MD Referred by: Sasser, Clarene Critchley, MD  Subjective: Chief Complaint  Patient presents with   Lower Back - Pain   HPI:  Joshua Strong is a 89 y.o. male who comes in today for planned repeat Bilateral L4-5  Lumbar Transforaminal epidural steroid injection with fluoroscopic guidance.  The patient has failed conservative care including home exercise, medications, time and activity modification.  This injection will be diagnostic and hopefully therapeutic.  Please see requesting physician notes for further details and justification. Patient received more than 50% pain relief from prior injection.   Referring: Ellin Goodie, FNP   ROS Otherwise per HPI.  Assessment & Plan: Visit Diagnoses:    ICD-10-CM   1. Lumbar radiculopathy  M54.16 XR C-ARM NO REPORT    Epidural Steroid injection    methylPREDNISolone acetate (DEPO-MEDROL) injection 40 mg      Plan: No additional findings.   Meds & Orders:  Meds ordered this encounter  Medications   methylPREDNISolone acetate (DEPO-MEDROL) injection 40 mg    Orders Placed This Encounter  Procedures   XR C-ARM NO REPORT   Epidural Steroid injection    Follow-up: Return if symptoms worsen or fail to improve.   Procedures: No procedures performed  Lumbosacral Transforaminal Epidural Steroid Injection - Sub-Pedicular Approach with Fluoroscopic Guidance  Patient: Joshua Strong      Date of Birth: 12-08-1930 MRN: [id number] PCP: Estanislado Pandy, MD      Visit Date: 01/06/2024   Universal Protocol:    Date/Time: 01/06/2024  Consent Given By: the patient  Position: PRONE  Additional Comments: Vital signs were monitored before and after the procedure. Patient was prepped and draped in the usual sterile fashion. The correct patient, procedure, and site was verified.   Injection Procedure Details:    Procedure diagnoses: Lumbar radiculopathy [M54.16]    Meds Administered:  Meds ordered this encounter  Medications   methylPREDNISolone acetate (DEPO-MEDROL) injection 40 mg    Laterality: Bilateral  Location/Site: L4  Needle:5.0 in., 22 ga.  Short bevel or Quincke spinal needle  Needle Placement: Transforaminal  Findings:    -Comments: Excellent flow of contrast along the nerve, nerve root and into the epidural space.  Procedure Details: After squaring off the end-plates to get a true AP view, the C-arm was positioned so that an oblique view of the foramen as noted above was visualized. The target area is just inferior to the "nose of the scotty dog" or sub pedicular. The soft tissues overlying this structure were infiltrated with 2-3 ml. of 1% Lidocaine without Epinephrine.  The spinal needle was inserted toward the target using a "trajectory" view along the fluoroscope beam.  Under AP and lateral visualization, the needle was advanced so it did not puncture dura and was located close the 6 O'Clock position of the pedical in AP tracterory. Biplanar projections were used to confirm position. Aspiration was confirmed to be negative for CSF and/or blood. A 1-2 ml. volume of Isovue-250 was injected and flow of contrast was noted at each level. Radiographs were obtained for documentation purposes.   After attaining the desired flow of contrast documented above, a 0.5 to 1.0 ml test dose of 0.25% Marcaine was injected into each respective transforaminal space.  The patient was observed for 90 seconds post injection.  After no sensory deficits were reported, and normal  lower extremity motor function was noted,   the above injectate was administered so that equal amounts of the injectate were placed at each foramen (level) into the transforaminal epidural space.   Additional Comments:  No complications occurred Dressing: 2 x 2 sterile gauze and Band-Aid    Post-procedure  details: Patient was observed during the procedure. Post-procedure instructions were reviewed.  Patient left the clinic in stable condition.    Clinical History: CLINICAL DATA:  Low back pain with symptoms persisting over 6 weeks of treatment   EXAM: MRI LUMBAR SPINE WITHOUT CONTRAST   TECHNIQUE: Multiplanar, multisequence MR imaging of the lumbar spine was performed. No intravenous contrast was administered.   COMPARISON:  08/15/2020   FINDINGS: Segmentation:  Standard.   Alignment:  Mild scoliosis and L4-5 anterolisthesis.   Vertebrae:  No fracture, evidence of discitis, or bone lesion.   Conus medullaris and cauda equina: Conus extends to the T12 level. Conus and cauda equina appear normal.   Paraspinal and other soft tissues: Postoperative scarring in the lower lumbar soft tissues. Right nephrectomy.   Disc levels:   T12- L1: Ventral spondylitic spurring.  No neural impingement   L1-L2: Disc narrowing and bulging with small central protrusion. Mild thecal sac stenosis   L2-L3: Disc narrowing and bulging with endplate ridging eccentric to the left. Borderline facet spurring. Mild to moderate narrowing of the thecal sac with asymmetric left subarticular recess stenosis, unchanged   L3-L4: Degenerative facet spurring and ligamentous thickening. No neural impingement   L4-L5: Degenerative facet spurring with ligamentum flavum thickening and anterolisthesis. A small degenerative ganglion may be present medial to the right facet joint, 2 mm in diameter. The disc is narrowed and bulging. Moderate spinal stenosis. Bilateral L5 impingement at the subarticular recesses. Degenerative right foraminal crowding but the foramina are patent based on axial images.   L5-S1:Posterior solid arthrodesis appearance. Prominent scar at a laminectomy defect, but noncompressive.   IMPRESSION: 1. Lumbar spine degeneration with mild scoliosis and L4-5 anterolisthesis-no  convincing change from 2021. 2. L4-5 bilateral subarticular recess stenosis and L5 impingement. 3. L2-3 mild to moderate spinal stenosis with asymmetric left subarticular recess narrowing.     Electronically Signed   By: Tiburcio Pea M.D.   On: 12/25/2022 07:47     Objective:  VS:  HT:    WT:   BMI:     BP:(!) 169/79  HR:(!) 57bpm  TEMP: ( )  RESP:  Physical Exam Vitals and nursing note reviewed.  Constitutional:      General: He is not in acute distress.    Appearance: Normal appearance. He is not ill-appearing.  HENT:     Head: Normocephalic and atraumatic.     Right Ear: External ear normal.     Left Ear: External ear normal.     Nose: No congestion.  Eyes:     Extraocular Movements: Extraocular movements intact.  Cardiovascular:     Rate and Rhythm: Normal rate.     Pulses: Normal pulses.  Pulmonary:     Effort: Pulmonary effort is normal. No respiratory distress.  Abdominal:     General: There is no distension.     Palpations: Abdomen is soft.  Musculoskeletal:        General: No tenderness or signs of injury.     Cervical back: Neck supple.     Right lower leg: No edema.     Left lower leg: No edema.     Comments: Patient has good distal strength  without clonus.  Skin:    Findings: No erythema or rash.  Neurological:     General: No focal deficit present.     Mental Status: He is alert and oriented to person, place, and time.     Cranial Nerves: No cranial nerve deficit.     Sensory: No sensory deficit.     Motor: Weakness present. No abnormal muscle tone.     Coordination: Coordination normal.     Gait: Gait abnormal.  Psychiatric:        Mood and Affect: Mood normal.        Behavior: Behavior normal.      Imaging: XR C-ARM NO REPORT Result Date: 01/06/2024 Please see Notes tab for imaging impression.

## 2024-01-08 NOTE — Progress Notes (Signed)
 Electrophysiology Office Note:   Date:  01/10/2024  ID:  Joshua Strong, DOB May 17, 1931, MRN [id number]  Primary Cardiologist: Euell Herrlich, MD Electrophysiologist: Ardeen Kohler, MD      History of Present Illness:   Joshua Strong is a 89 y.o. male with h/o GERD, renal cell carcinoma, right bundle branch block and first-degree AV delay who is being seen today for evaluation of syncope at the request of Dr. Chancy Comber.   Discussed the use of AI scribe software for clinical note transcription with the patient, who gave verbal consent to proceed.  History of Present Illness Patient has not had any more episodes of of dizziness and pre-syncope, since stopping one of his prostate medications 3 weeks ago.  He reports that while he wore his Zio monitor he did continue to have episodes of dizziness.  Despite this, his Zio monitor did not show any evidence of significant bradycardia or AV block.  He more recently underwent a stress test which was normal.  He continues to be relatively active.  He is doing well otherwise.  No new or acute complaints today.    Review of systems complete and found to be negative unless listed in HPI.   EP Information / Studies Reviewed:    EKG is not ordered today. EKG from 12/29/23 reviewed which showed sinus rhythm with RBBB, first degree AV delay.      Nuclear Stress 12/11/23:    Findings are consistent with no ischemia. The study is low risk.   No ST deviation was noted.   LV perfusion is normal.   Left ventricular function is normal. Nuclear stress EF: 55%. The left ventricular ejection fraction is normal (55-65%). End diastolic cavity size is normal. End systolic cavity size is normal.   Prior study not available for comparison.   No reversible ischemia. LVEF 55% with normal wall motion. There was lexiscan related QRS widening and PVC's noted during the study with underlying RBBB. Overall, low risk study. No prior for comparison.  Echo 06/14/2022: Normal LV  size and function, LVEF 60 to 65%.  Grade 1 diastolic dysfunction. Normal RV size and function. Left atrium mildly dilated. Moderate MAC.  No mitral stenosis. Aortic sclerosis without stenosis.  Cardiac monitor 05/2022: Sinus bradycardia with RBBB and first-degree AV delay. Heart rate ranging 40 to 136 bpm with an average of 64bpm. No pauses.  Zio 08/2023:  Patch Wear Time:  13 days and 15 hours (2024-12-02T17:19:19-0500 to 2024-12-16T09:02:05-499   HR 51 - 182, average 69 bpm. 22 nonsustained SVT (longest 8 beats) and 2 nonsustained VT (longest 4 beats) No atrial fibrillation detected. Occasional supraventricular ectopy, 1.1%. Occasional ventricular ectopy, 1.6%. No sustained arrhythmias. Symptom trigger episodes correspond to sinus rhythm with PACs and/or PVCs.   Physical Exam:   VS:  BP (!) 110/56   Pulse 74   Ht 5\' 6"  (1.676 m)   Wt 160 lb (72.6 kg)   SpO2 97%   BMI 25.82 kg/m    Wt Readings from Last 3 Encounters:  01/09/24 160 lb (72.6 kg)  12/29/23 167 lb 1.7 oz (75.8 kg)  12/11/23 167 lb (75.8 kg)     GEN: Well nourished, well developed in no acute distress NECK: No JVD CARDIAC: Normal rate and regular rhythm.  RESPIRATORY:  Clear to auscultation without rales, wheezing or rhonchi  ABDOMEN: Soft, non-tender, non-distended EXTREMITIES:  No edema; No deformity   ASSESSMENT AND PLAN:   Joshua Strong is a 89 y.o. male with h/o  GERD, renal cell carcinoma, right bundle branch block and first-degree AV delay who is being seen today for evaluation of syncope at the request of Dr. Chancy Comber. His episodes of dizziness and near syncope were not classic for an arrhythmia. Most of his episodes of dizziness and presycope appear to be associated with positional changes. He does, however, have evidence of conduction disease - first degree AV delay, RBBB and LPFB on today's ECG.  For this reason, a Zio monitor was placed following her last visit.  He continued to have dizziness and  presyncope while wearing the monitor, but did not have any evidence of AV block or significant bradycardia.  For this reason, his dizziness and near syncope were felt to be unrelated to conduction disease.  He stated that since stopping a prostate medication 3 weeks ago he has had no further episodes of dizziness or near syncope, which further supports a likely orthostatic etiology..   # Syncope and dizziness: Appears to be orthostatic based on positional changes and resolution of symptoms after stopping his prostate medication which I suspect was an alpha-blocker. # Right bundle branch block # First-degree AV delay -There were no evidence of AV block or significant bradycardia on his monitor.  Patient continues to be active without complaints.  No hard indication for permanent pacemaker at this time and he would like to avoid pacemaker implant, if possible.  He does remain at risk for pacemaker in the future based solely on presence of underlying conduction disease.  We should continue monitoring with EKGs during office visits.  I also gave him ED precautions regarding slow heart rates, dizziness, syncope, exertional intolerance.  #Hypertension -At goal today.  Recommend checking blood pressures 1-2 times per week at home and recording the values.  Recommend bringing these recordings to the primary care physician.  Signed, Ardeen Kohler, MD

## 2024-01-09 ENCOUNTER — Encounter: Payer: Self-pay | Admitting: Cardiology

## 2024-01-09 ENCOUNTER — Ambulatory Visit: Payer: Medicare Other | Attending: Cardiology | Admitting: Cardiology

## 2024-01-09 VITALS — BP 110/56 | HR 74 | Ht 66.0 in | Wt 160.0 lb

## 2024-01-09 DIAGNOSIS — I4510 Unspecified right bundle-branch block: Secondary | ICD-10-CM | POA: Insufficient documentation

## 2024-01-09 DIAGNOSIS — R55 Syncope and collapse: Secondary | ICD-10-CM | POA: Diagnosis not present

## 2024-01-09 DIAGNOSIS — I10 Essential (primary) hypertension: Secondary | ICD-10-CM | POA: Diagnosis not present

## 2024-01-09 DIAGNOSIS — I440 Atrioventricular block, first degree: Secondary | ICD-10-CM | POA: Diagnosis not present

## 2024-01-09 NOTE — Patient Instructions (Signed)
 Medication Instructions:  Your physician recommends that you continue on your current medications as directed. Please refer to the Current Medication list given to you today.  *If you need a refill on your cardiac medications before your next appointment, please call your pharmacy*  Follow-Up: At [facility], you and your health needs are our priority.  As part of our continuing mission to provide you with exceptional heart care, our providers are all part of one team.  This team includes your primary Cardiologist (physician) and Advanced Practice Providers or APPs (Physician Assistants and Nurse Practitioners) who all work together to provide you with the care you need, when you need it.  Your next appointment:   6 months  Provider:   You will see one of the following Advanced Practice Providers on your designated Care Team:   Francis Dowse, [place] Casimiro Needle "Mardelle Matte" [place], PA-C Sherie Don, NP Canary Brim, NP        1st Floor: - Lobby - Registration  - Pharmacy  - Lab - Cafe  2nd Floor: - PV Lab - Diagnostic Testing (echo, CT, nuclear med)  3rd Floor: - Vacant  4th Floor: - TCTS (cardiothoracic surgery) - AFib Clinic - Structural Heart Clinic - Vascular Surgery  - Vascular Ultrasound  5th Floor: - [facility] Cardiology (general and EP) - Clinical Pharmacy for coumadin, hypertension, lipid, weight-loss medications, and med management appointments    Valet parking services will be available as well.

## 2024-01-13 DIAGNOSIS — R413 Other amnesia: Secondary | ICD-10-CM | POA: Diagnosis not present

## 2024-01-13 DIAGNOSIS — Z5181 Encounter for therapeutic drug level monitoring: Secondary | ICD-10-CM | POA: Diagnosis not present

## 2024-01-13 DIAGNOSIS — M810 Age-related osteoporosis without current pathological fracture: Secondary | ICD-10-CM | POA: Diagnosis not present

## 2024-01-13 DIAGNOSIS — Z85528 Personal history of other malignant neoplasm of kidney: Secondary | ICD-10-CM | POA: Diagnosis not present

## 2024-01-13 DIAGNOSIS — N289 Disorder of kidney and ureter, unspecified: Secondary | ICD-10-CM | POA: Diagnosis not present

## 2024-01-13 DIAGNOSIS — Z9181 History of falling: Secondary | ICD-10-CM | POA: Diagnosis not present

## 2024-01-13 DIAGNOSIS — R2689 Other abnormalities of gait and mobility: Secondary | ICD-10-CM | POA: Diagnosis not present

## 2024-01-13 DIAGNOSIS — K219 Gastro-esophageal reflux disease without esophagitis: Secondary | ICD-10-CM | POA: Diagnosis not present

## 2024-01-13 DIAGNOSIS — M353 Polymyalgia rheumatica: Secondary | ICD-10-CM | POA: Diagnosis not present

## 2024-01-23 ENCOUNTER — Institutional Professional Consult (permissible substitution): Payer: Medicare Other | Admitting: Diagnostic Neuroimaging

## 2024-01-28 DIAGNOSIS — E782 Mixed hyperlipidemia: Secondary | ICD-10-CM | POA: Diagnosis not present

## 2024-01-28 DIAGNOSIS — K2100 Gastro-esophageal reflux disease with esophagitis, without bleeding: Secondary | ICD-10-CM | POA: Diagnosis not present

## 2024-01-28 DIAGNOSIS — N400 Enlarged prostate without lower urinary tract symptoms: Secondary | ICD-10-CM | POA: Diagnosis not present

## 2024-02-27 DIAGNOSIS — N400 Enlarged prostate without lower urinary tract symptoms: Secondary | ICD-10-CM | POA: Diagnosis not present

## 2024-02-27 DIAGNOSIS — K2100 Gastro-esophageal reflux disease with esophagitis, without bleeding: Secondary | ICD-10-CM | POA: Diagnosis not present

## 2024-02-27 DIAGNOSIS — E782 Mixed hyperlipidemia: Secondary | ICD-10-CM | POA: Diagnosis not present

## 2024-03-08 DIAGNOSIS — L110 Acquired keratosis follicularis: Secondary | ICD-10-CM | POA: Diagnosis not present

## 2024-03-08 DIAGNOSIS — M79674 Pain in right toe(s): Secondary | ICD-10-CM | POA: Diagnosis not present

## 2024-03-08 DIAGNOSIS — M79671 Pain in right foot: Secondary | ICD-10-CM | POA: Diagnosis not present

## 2024-03-08 DIAGNOSIS — I739 Peripheral vascular disease, unspecified: Secondary | ICD-10-CM | POA: Diagnosis not present

## 2024-03-08 DIAGNOSIS — M79675 Pain in left toe(s): Secondary | ICD-10-CM | POA: Diagnosis not present

## 2024-03-08 DIAGNOSIS — M79672 Pain in left foot: Secondary | ICD-10-CM | POA: Diagnosis not present

## 2024-03-15 DIAGNOSIS — E782 Mixed hyperlipidemia: Secondary | ICD-10-CM | POA: Diagnosis not present

## 2024-03-15 DIAGNOSIS — E7849 Other hyperlipidemia: Secondary | ICD-10-CM | POA: Diagnosis not present

## 2024-03-15 DIAGNOSIS — N1832 Chronic kidney disease, stage 3b: Secondary | ICD-10-CM | POA: Diagnosis not present

## 2024-03-15 DIAGNOSIS — R7301 Impaired fasting glucose: Secondary | ICD-10-CM | POA: Diagnosis not present

## 2024-03-22 DIAGNOSIS — K2100 Gastro-esophageal reflux disease with esophagitis, without bleeding: Secondary | ICD-10-CM | POA: Diagnosis not present

## 2024-03-22 DIAGNOSIS — R2689 Other abnormalities of gait and mobility: Secondary | ICD-10-CM | POA: Diagnosis not present

## 2024-03-22 DIAGNOSIS — Z6825 Body mass index (BMI) 25.0-25.9, adult: Secondary | ICD-10-CM | POA: Diagnosis not present

## 2024-03-22 DIAGNOSIS — E782 Mixed hyperlipidemia: Secondary | ICD-10-CM | POA: Diagnosis not present

## 2024-03-22 DIAGNOSIS — N1832 Chronic kidney disease, stage 3b: Secondary | ICD-10-CM | POA: Diagnosis not present

## 2024-03-29 DIAGNOSIS — E782 Mixed hyperlipidemia: Secondary | ICD-10-CM | POA: Diagnosis not present

## 2024-03-29 DIAGNOSIS — N400 Enlarged prostate without lower urinary tract symptoms: Secondary | ICD-10-CM | POA: Diagnosis not present

## 2024-03-29 DIAGNOSIS — K2100 Gastro-esophageal reflux disease with esophagitis, without bleeding: Secondary | ICD-10-CM | POA: Diagnosis not present

## 2024-04-29 ENCOUNTER — Encounter (HOSPITAL_COMMUNITY): Payer: Self-pay

## 2024-04-29 ENCOUNTER — Emergency Department (HOSPITAL_COMMUNITY)

## 2024-04-29 ENCOUNTER — Emergency Department (HOSPITAL_COMMUNITY)
Admission: EM | Admit: 2024-04-29 | Discharge: 2024-04-30 | Disposition: A | Discharge status: Home / Self Care | Attending: Emergency Medicine | Admitting: Emergency Medicine

## 2024-04-29 DIAGNOSIS — Z9181 History of falling: Secondary | ICD-10-CM | POA: Diagnosis present

## 2024-04-29 DIAGNOSIS — M542 Cervicalgia: Secondary | ICD-10-CM | POA: Diagnosis present

## 2024-04-29 DIAGNOSIS — R079 Chest pain, unspecified: Secondary | ICD-10-CM | POA: Diagnosis not present

## 2024-04-29 DIAGNOSIS — R519 Headache, unspecified: Secondary | ICD-10-CM | POA: Insufficient documentation

## 2024-04-29 DIAGNOSIS — I6503 Occlusion and stenosis of bilateral vertebral arteries: Secondary | ICD-10-CM | POA: Diagnosis not present

## 2024-04-29 DIAGNOSIS — R55 Syncope and collapse: Secondary | ICD-10-CM | POA: Diagnosis not present

## 2024-04-29 DIAGNOSIS — G43809 Other migraine, not intractable, without status migrainosus: Secondary | ICD-10-CM | POA: Diagnosis not present

## 2024-04-29 DIAGNOSIS — I4510 Unspecified right bundle-branch block: Secondary | ICD-10-CM | POA: Insufficient documentation

## 2024-04-29 DIAGNOSIS — R609 Edema, unspecified: Secondary | ICD-10-CM | POA: Diagnosis not present

## 2024-04-29 DIAGNOSIS — W19XXXA Unspecified fall, initial encounter: Secondary | ICD-10-CM | POA: Diagnosis not present

## 2024-04-29 DIAGNOSIS — R42 Dizziness and giddiness: Secondary | ICD-10-CM | POA: Insufficient documentation

## 2024-04-29 DIAGNOSIS — E782 Mixed hyperlipidemia: Secondary | ICD-10-CM | POA: Diagnosis not present

## 2024-04-29 DIAGNOSIS — K2100 Gastro-esophageal reflux disease with esophagitis, without bleeding: Secondary | ICD-10-CM | POA: Diagnosis not present

## 2024-04-29 DIAGNOSIS — I700 Atherosclerosis of aorta: Secondary | ICD-10-CM | POA: Diagnosis not present

## 2024-04-29 DIAGNOSIS — R4789 Other speech disturbances: Secondary | ICD-10-CM | POA: Insufficient documentation

## 2024-04-29 DIAGNOSIS — N400 Enlarged prostate without lower urinary tract symptoms: Secondary | ICD-10-CM | POA: Diagnosis not present

## 2024-04-29 DIAGNOSIS — G43909 Migraine, unspecified, not intractable, without status migrainosus: Secondary | ICD-10-CM | POA: Diagnosis not present

## 2024-04-29 DIAGNOSIS — Z79899 Other long term (current) drug therapy: Secondary | ICD-10-CM | POA: Diagnosis not present

## 2024-04-29 LAB — COMPREHENSIVE METABOLIC PANEL WITH GFR
ALT: 21 U/L (ref 0–44)
AST: 34 U/L (ref 15–41)
Albumin: 4.2 g/dL (ref 3.5–5.0)
Alkaline Phosphatase: 50 U/L (ref 38–126)
Anion gap: 9 (ref 5–15)
BUN: 22 mg/dL (ref 8–23)
CO2: 23 mmol/L (ref 22–32)
Calcium: 9.6 mg/dL (ref 8.9–10.3)
Chloride: 105 mmol/L (ref 98–111)
Creatinine, Ser: 1.36 mg/dL — ABNORMAL HIGH (ref 0.61–1.24)
GFR, Estimated: 49 mL/min — ABNORMAL LOW (ref >60)
Glucose, Bld: 84 mg/dL (ref 70–99)
Potassium: 4.8 mmol/L (ref 3.5–5.1)
Sodium: 137 mmol/L (ref 135–145)
Total Bilirubin: 1.1 mg/dL (ref 0.0–1.2)
Total Protein: 7.1 g/dL (ref 6.5–8.1)

## 2024-04-29 LAB — CBC WITH DIFFERENTIAL/PLATELET
Abs Immature Granulocytes: 0.02 K/uL (ref 0.00–0.07)
Basophils Absolute: 0.1 K/uL (ref 0.0–0.1)
Basophils Relative: 1 %
Eosinophils Absolute: 0.2 K/uL (ref 0.0–0.5)
Eosinophils Relative: 3 %
HCT: 44 % (ref 39.0–52.0)
Hemoglobin: 14.3 g/dL (ref 13.0–17.0)
Immature Granulocytes: 0 %
Lymphocytes Relative: 24 %
Lymphs Abs: 1.8 K/uL (ref 0.7–4.0)
MCH: 30.4 pg (ref 26.0–34.0)
MCHC: 32.5 g/dL (ref 30.0–36.0)
MCV: 93.6 fL (ref 80.0–100.0)
Monocytes Absolute: 0.6 K/uL (ref 0.1–1.0)
Monocytes Relative: 8 %
Neutro Abs: 5 K/uL (ref 1.7–7.7)
Neutrophils Relative %: 64 %
Platelets: 224 K/uL (ref 150–400)
RBC: 4.7 MIL/uL (ref 4.22–5.81)
RDW: 13.2 % (ref 11.5–15.5)
WBC: 7.7 K/uL (ref 4.0–10.5)
nRBC: 0 % (ref 0.0–0.2)

## 2024-04-29 LAB — TROPONIN I (HIGH SENSITIVITY)
Troponin I (High Sensitivity): 12 ng/L (ref <18)
Troponin I (High Sensitivity): 12 ng/L (ref <18)

## 2024-04-29 LAB — CK: Total CK: 76 U/L (ref 49–397)

## 2024-04-29 NOTE — ED Triage Notes (Signed)
 Pt comes in by EMS for dizziness, neck pain and migraine. Pt has a hx of headches. Pt took tylenol  around 1200 but did not help. Pt fell 4 days ago but denies hitting his head. He did hit his back. Pt is A&Ox4.   EMS VS BP 191/75 67 HR 98%RA 5/10 pain scale 150 BG

## 2024-04-30 ENCOUNTER — Emergency Department (HOSPITAL_COMMUNITY)

## 2024-04-30 DIAGNOSIS — I6503 Occlusion and stenosis of bilateral vertebral arteries: Secondary | ICD-10-CM | POA: Diagnosis not present

## 2024-04-30 DIAGNOSIS — R519 Headache, unspecified: Secondary | ICD-10-CM | POA: Diagnosis not present

## 2024-04-30 DIAGNOSIS — I700 Atherosclerosis of aorta: Secondary | ICD-10-CM | POA: Diagnosis not present

## 2024-04-30 DIAGNOSIS — R42 Dizziness and giddiness: Secondary | ICD-10-CM | POA: Diagnosis not present

## 2024-04-30 LAB — URINALYSIS, ROUTINE W REFLEX MICROSCOPIC
Bilirubin Urine: NEGATIVE
Glucose, UA: NEGATIVE mg/dL
Hgb urine dipstick: NEGATIVE
Ketones, ur: NEGATIVE mg/dL
Leukocytes,Ua: NEGATIVE
Nitrite: NEGATIVE
Protein, ur: NEGATIVE mg/dL
Specific Gravity, Urine: 1.021 (ref 1.005–1.030)
pH: 6 (ref 5.0–8.0)

## 2024-04-30 LAB — ETHANOL: Alcohol, Ethyl (B): <15 mg/dL (ref <15)

## 2024-04-30 LAB — APTT: aPTT: 33 s (ref 24–36)

## 2024-04-30 LAB — RAPID URINE DRUG SCREEN, HOSP PERFORMED
Amphetamines: NOT DETECTED
Barbiturates: NOT DETECTED
Benzodiazepines: NOT DETECTED
Cocaine: NOT DETECTED
Opiates: NOT DETECTED
Tetrahydrocannabinol: NOT DETECTED

## 2024-04-30 LAB — PROTIME-INR
INR: 1 (ref 0.8–1.2)
Prothrombin Time: 13.5 s (ref 11.4–15.2)

## 2024-04-30 MED ORDER — ACETAMINOPHEN 500 MG PO TABS
1000.0000 mg | ORAL_TABLET | Freq: Once | ORAL | Status: AC
  Administered 2024-04-30: 1000 mg via ORAL
  Filled 2024-04-30: qty 2

## 2024-04-30 MED ORDER — IOHEXOL 350 MG/ML SOLN
75.0000 mL | Freq: Once | INTRAVENOUS | Status: AC | PRN
  Administered 2024-04-30: 75 mL via INTRAVENOUS

## 2024-04-30 NOTE — ED Provider Notes (Signed)
 [place] [facility]MENT AT [facility] Provider Note   CSN: [id number] Arrival date & time: 04/29/24  1659     Patient presents with: Dizziness and Migraine   Joshua Strong is a 89 y.o. male.   HPI     This is a 89 year old male who presents with dizziness, headache, near syncope.  Patient reports over the last 5 years he has developed headache and dizziness.  He has followed up with neurology and was told it was related to stress.  He had onset of headache that was right-sided today.  Felt dizzy.  Describes it as lightheaded.  States that he felt like he was going to pass out.  Wife describes that he went into the bedroom and came out and stated I just need to call 911.  She states that he was talking crazy.  It does not sound like he had dysarthria or slurred speech but was saying things that she did not think made sense.  He did fall 1 week ago and hit his head.  He did not get seen at that time.  Prior to Admission medications   Medication Sig Start Date End Date Taking? Authorizing Provider  acetaminophen  (TYLENOL ) 650 MG CR tablet Take 650 mg by mouth every 8 (eight) hours as needed for pain (arthritis).    [provider]  Calcium  Carbonate (CALTRATE 600 PO) Take 1 tablet by mouth daily.    [provider]  denosumab  (PROLIA ) 60 MG/ML SOSY injection Inject 60 mg into the skin every 6 (six) months.    [provider]  diazepam  (VALIUM ) 5 MG tablet Take one tablet by mouth with food one hour prior to procedure. May repeat 30 minutes prior if needed. 12/13/22   Williams, Megan E, NP  diclofenac Sodium (VOLTAREN) 1 % GEL Apply topically. 04/08/18   [provider]  finasteride (PROSCAR) 5 MG tablet Take 5 mg by mouth daily.    [provider]  fluticasone (FLONASE) 50 MCG/ACT nasal spray Place 1 spray into both nostrils daily as needed for allergies or rhinitis.    [provider]  meclizine (ANTIVERT) 12.5 MG  tablet Take 12.5 mg by mouth 3 (three) times daily as needed for dizziness.    [provider]  Multiple Vitamin (MULTIVITAMIN WITH MINERALS) TABS tablet Take 1 tablet by mouth daily.    [provider]  OVER THE COUNTER MEDICATION Super Beta Prostate twice daily    [provider]  pantoprazole  (PROTONIX ) 40 MG tablet Take 40 mg by mouth 2 (two) times daily. 07/20/22   [provider]  Polyethyl Glycol-Propyl Glycol (SYSTANE FREE OP) Apply 1-2 drops to eye daily. Both eyes    [provider]    Allergies: Morphine and codeine and Metoclopramide  hcl    Review of Systems  Constitutional:  Negative for fever.  Respiratory:  Negative for shortness of breath.   Cardiovascular:  Negative for chest pain.  Gastrointestinal:  Negative for abdominal pain.  Neurological:  Positive for speech difficulty, light-headedness and headaches. Negative for weakness.  All other systems reviewed and are negative.   Updated Vital Signs BP (!) 149/70   Pulse (!) 54   Temp 97.8 F (36.6 C)   Resp 15   Ht 1.676 m (5' 6)   Wt 68 kg   SpO2 96%   BMI 24.21 kg/m   Physical Exam Vitals and nursing note reviewed.  Constitutional:      Appearance: He is well-developed.  Comments: Well-appearing, appears younger than stated age  HENT:     Head: Normocephalic and atraumatic.  Eyes:     Pupils: Pupils are equal, round, and reactive to light.  Cardiovascular:     Rate and Rhythm: Normal rate and regular rhythm.     Heart sounds: Normal heart sounds. No murmur heard. Pulmonary:     Effort: Pulmonary effort is normal. No respiratory distress.     Breath sounds: Normal breath sounds. No wheezing.  Abdominal:     General: Bowel sounds are normal.     Palpations: Abdomen is soft.     Tenderness: There is no abdominal tenderness. There is no rebound.  Musculoskeletal:     Cervical back: Neck supple.  Lymphadenopathy:     Cervical: No cervical adenopathy.   Skin:    General: Skin is warm and dry.  Neurological:     Mental Status: He is alert and oriented to person, place, and time.     Comments: Cranial nerves II through XII intact, 5 out of 5 strength in all 4 extremities, no dysmetria to finger-nose-finger  Psychiatric:        Mood and Affect: Mood normal.     (all labs ordered are listed, but only abnormal results are displayed) Labs Reviewed  COMPREHENSIVE METABOLIC PANEL WITH GFR - Abnormal; Notable for the following components:      Result Value   Creatinine, Ser 1.36 (*)    GFR, Estimated 49 (*)    All other components within normal limits  CBC WITH DIFFERENTIAL/PLATELET  CK  ETHANOL  PROTIME-INR  APTT  URINALYSIS, ROUTINE W REFLEX MICROSCOPIC  RAPID URINE DRUG SCREEN, HOSP PERFORMED  TROPONIN I (HIGH SENSITIVITY)  TROPONIN I (HIGH SENSITIVITY)    EKG: EKG Interpretation Date/Time:  Friday April 30 2024 00:15:07 EDT Ventricular Rate:  53 PR Interval:  267 QRS Duration:  148 QT Interval:  445 QTC Calculation: 418 R Axis:   -24  Text Interpretation: Sinus rhythm Prolonged PR interval Right bundle branch block Confirmed by Bari Pfeiffer ([id number]) on 04/30/2024 12:54:58 AM  Radiology: CT ANGIO HEAD NECK W WO CM Result Date: 04/30/2024 CLINICAL DATA:  Vertigo, central EXAM: CT ANGIOGRAPHY HEAD AND NECK WITH AND WITHOUT CONTRAST TECHNIQUE: Multidetector CT imaging of the head and neck was performed using the standard protocol during bolus administration of intravenous contrast. Multiplanar CT image reconstructions and MIPs were obtained to evaluate the vascular anatomy. Carotid stenosis measurements (when applicable) are obtained utilizing NASCET criteria, using the distal internal carotid diameter as the denominator. RADIATION DOSE REDUCTION: This exam was performed according to the departmental dose-optimization program which includes automated exposure control, adjustment of the mA and/or kV according to patient size  and/or use of iterative reconstruction technique. CONTRAST:  75mL OMNIPAQUE  IOHEXOL  350 MG/ML SOLN COMPARISON:  CT head from earlier today. FINDINGS: CTA NECK FINDINGS Aortic arch: Aortic atherosclerosis. Great vessel origins are patent. Right carotid system: No evidence of dissection, stenosis (50% or greater), or occlusion. Left carotid system: No evidence of dissection, stenosis (50% or greater), or occlusion. Vertebral arteries: Mild bilateral vertebral artery origin stenosis. Left dominant. No evidence of dissection, stenosis (50% or greater), or occlusion. Skeleton: No acute fracture. Other neck: No acute abnormality. Upper chest: Lung apices are clear. Review of the MIP images confirms the above findings CTA HEAD FINDINGS Anterior circulation: Bilateral intracranial ICAs, MCAs and ACAs are patent without proximal hemodynamically significant stenosis. Posterior circulation: Bilateral intradural vertebral arteries, basilar artery and bilateral posterior cerebral arteries  are patent without proximal hemodynamically significant stenosis. Left fetal type PCA, anatomic variant. Venous sinuses: As permitted by contrast timing, patent. Anatomic variants: Detailed above. Review of the MIP images confirms the above findings IMPRESSION: No emergent large vessel occlusion or proximal hemodynamically significant stenosis. Electronically Signed   By: Gilmore GORMAN Molt M.D.   On: 04/30/2024 01:57   CT Head Wo Contrast Result Date: 04/29/2024 CLINICAL DATA:  Dizziness and headaches, initial encounter EXAM: CT HEAD WITHOUT CONTRAST TECHNIQUE: Contiguous axial images were obtained from the base of the skull through the vertex without intravenous contrast. RADIATION DOSE REDUCTION: This exam was performed according to the departmental dose-optimization program which includes automated exposure control, adjustment of the mA and/or kV according to patient size and/or use of iterative reconstruction technique. COMPARISON:   02/31/2025 FINDINGS: Brain: No evidence of acute infarction, hemorrhage, hydrocephalus, extra-axial collection or mass lesion/mass effect. Mild atrophic changes are noted. Vascular: No hyperdense vessel or unexpected calcification. Skull: Normal. Negative for fracture or focal lesion. Sinuses/Orbits: No acute finding. Other: None. IMPRESSION: Mild atrophic changes are noted without acute abnormality. Electronically Signed   By: Oneil Devonshire M.D.   On: 04/29/2024 23:36     Procedures   Medications Ordered in the ED  acetaminophen  (TYLENOL ) tablet 1,000 mg (1,000 mg Oral Given 04/30/24 0029)  iohexol  (OMNIPAQUE ) 350 MG/ML injection 75 mL (75 mLs Intravenous Contrast Given 04/30/24 0123)                                    Medical Decision Making Amount and/or Complexity of Data Reviewed Labs: ordered. Radiology: ordered.  Risk OTC drugs. Prescription drug management.   This patient presents to the ED for concern of near syncope, headache, this involves an extensive number of treatment options, and is a complaint that carries with it a high risk of complications and morbidity.  I considered the following differential and admission for this acute, potentially life threatening condition.  The differential diagnosis includes vertigo, CVA, head trauma, arrhythmia, migraine  MDM:    This is a 89 year old male who presents with headache and near syncope.  Also noted to have some speech difficulty by his wife.  He is awake, alert, oriented.  Neurologically intact.  Reports history of headaches.  However he did have a fall.  CT obtained.  No evidence of acute traumatic injury.  Vessels appear intact.  Stroke workup is largely reassuring.  Patient agreeable to staying for MRI.  (Labs, imaging, consults)  Labs: I Ordered, and personally interpreted labs.  The pertinent results include: Stroke workup  Imaging Studies ordered: I ordered imaging studies including CT, CTA head neck I independently  visualized and interpreted imaging. I agree with the radiologist interpretation  Additional history obtained from chart review.  External records from outside source obtained and reviewed including prior evaluations  Cardiac Monitoring: The patient was maintained on a cardiac monitor.  If on the cardiac monitor, I personally viewed and interpreted the cardiac monitored which showed an underlying rhythm of: Sinus  Reevaluation: After the interventions noted above, I reevaluated the patient and found that they have :stayed the same  Social Determinants of Health:  lives independently  Disposition: Pending MRI  Co morbidities that complicate the patient evaluation  Past Medical History:  Diagnosis Date   Arthritis    Dizziness    GERD (gastroesophageal reflux disease)    GI bleed    ulcer  Hemorrhoid    Memory loss    Pancreatitis    Pneumonia    Renal cell carcinoma (HCC)    Syncope    Tremor    Tubular adenoma 2013     Medicines Meds ordered this encounter  Medications   acetaminophen  (TYLENOL ) tablet 1,000 mg   iohexol  (OMNIPAQUE ) 350 MG/ML injection 75 mL    I have reviewed the patients home medicines and have made adjustments as needed  Problem List / ED Course: Problem List Items Addressed This Visit   None            Final diagnoses:  None    ED Discharge Orders     None          Bari Charmaine FALCON, MD 04/30/24 [phone]

## 2024-04-30 NOTE — ED Provider Notes (Signed)
  Physical Exam  BP (!) 150/76   Pulse (!) 55   Temp 97.8 F (36.6 C)   Resp 15   Ht 5' 6 (1.676 m)   Wt 68 kg   SpO2 97%   BMI 24.21 kg/m   Physical Exam  Procedures  Procedures  ED Course / MDM    Medical Decision Making Amount and/or Complexity of Data Reviewed Labs: ordered. Radiology: ordered.  Risk OTC drugs. Prescription drug management.  Received in signout.  Head ache.  May have difficulty speaking.  Had had some near syncope.  Workup reassuring.  Negative MRI.  Follow-up with PCP and neurology.       Patsey Lot, MD 04/30/24 [phone]

## 2024-05-17 DIAGNOSIS — L110 Acquired keratosis follicularis: Secondary | ICD-10-CM | POA: Diagnosis not present

## 2024-05-17 DIAGNOSIS — M79674 Pain in right toe(s): Secondary | ICD-10-CM | POA: Diagnosis not present

## 2024-05-17 DIAGNOSIS — M79672 Pain in left foot: Secondary | ICD-10-CM | POA: Diagnosis not present

## 2024-05-17 DIAGNOSIS — I739 Peripheral vascular disease, unspecified: Secondary | ICD-10-CM | POA: Diagnosis not present

## 2024-05-17 DIAGNOSIS — M79675 Pain in left toe(s): Secondary | ICD-10-CM | POA: Diagnosis not present

## 2024-05-17 DIAGNOSIS — M79671 Pain in right foot: Secondary | ICD-10-CM | POA: Diagnosis not present

## 2024-05-25 ENCOUNTER — Telehealth: Payer: Self-pay | Admitting: Physical Medicine and Rehabilitation

## 2024-05-25 ENCOUNTER — Other Ambulatory Visit: Payer: Self-pay | Admitting: Physical Medicine and Rehabilitation

## 2024-05-25 DIAGNOSIS — M48062 Spinal stenosis, lumbar region with neurogenic claudication: Secondary | ICD-10-CM

## 2024-05-25 DIAGNOSIS — M5416 Radiculopathy, lumbar region: Secondary | ICD-10-CM

## 2024-05-25 NOTE — Telephone Encounter (Signed)
 Pt called requesting an appt for back injection. Last injection 01/06/24. Please call pt at [phone]

## 2024-05-28 DIAGNOSIS — K2100 Gastro-esophageal reflux disease with esophagitis, without bleeding: Secondary | ICD-10-CM | POA: Diagnosis not present

## 2024-05-28 DIAGNOSIS — E782 Mixed hyperlipidemia: Secondary | ICD-10-CM | POA: Diagnosis not present

## 2024-05-28 DIAGNOSIS — N400 Enlarged prostate without lower urinary tract symptoms: Secondary | ICD-10-CM | POA: Diagnosis not present

## 2024-06-03 ENCOUNTER — Other Ambulatory Visit: Payer: Self-pay

## 2024-06-03 ENCOUNTER — Ambulatory Visit (INDEPENDENT_AMBULATORY_CARE_PROVIDER_SITE_OTHER): Admitting: Physical Medicine and Rehabilitation

## 2024-06-03 VITALS — BP 149/69 | HR 63

## 2024-06-03 DIAGNOSIS — M5416 Radiculopathy, lumbar region: Secondary | ICD-10-CM

## 2024-06-03 MED ORDER — METHYLPREDNISOLONE ACETATE 40 MG/ML IJ SUSP
40.0000 mg | Freq: Once | INTRAMUSCULAR | Status: AC
Start: 2024-06-03 — End: 2024-06-03
  Administered 2024-06-03: 40 mg

## 2024-06-03 NOTE — Progress Notes (Signed)
 Pain Scale   Average Pain 6 Patient advising he has chronic lower back pain radiating bilaterally to legs right leg is worse today. Patient advising his pain increases when walking and decreases when resting.        +Driver, -BT, -Dye Allergies.

## 2024-06-14 NOTE — Progress Notes (Signed)
 Joshua Strong - 89 y.o. male MRN [id number]  Date of birth: 1931/07/25  Office Visit Note: Visit Date: 06/03/2024 PCP: Atilano Deward ORN, MD Referred by: Sasser, Deward ORN, MD  Subjective: Chief Complaint  Patient presents with   Lower Back - Pain   HPI:  Joshua Strong is a 89 y.o. male who comes in today for planned repeat Bilateral L4-5  Lumbar Transforaminal epidural steroid injection with fluoroscopic guidance.  The patient has failed conservative care including home exercise, medications, time and activity modification.  This injection will be diagnostic and hopefully therapeutic.  Please see requesting physician notes for further details and justification. Patient received more than 50% pain relief from prior injection.   Referring: Duwaine Pouch, FNP   ROS Otherwise per HPI.  Assessment & Plan: Visit Diagnoses:    ICD-10-CM   1. Lumbar radiculopathy  M54.16 XR C-ARM NO REPORT    Epidural Steroid injection    methylPREDNISolone  acetate (DEPO-MEDROL ) injection 40 mg      Plan: No additional findings.   Meds & Orders:  Meds ordered this encounter  Medications   methylPREDNISolone  acetate (DEPO-MEDROL ) injection 40 mg    Orders Placed This Encounter  Procedures   XR C-ARM NO REPORT   Epidural Steroid injection    Follow-up: Return for visit to requesting provider as needed.   Procedures: No procedures performed  Lumbosacral Transforaminal Epidural Steroid Injection - Sub-Pedicular Approach with Fluoroscopic Guidance  Patient: Joshua Strong      Date of Birth: 10/13/1930 MRN: [id number] PCP: Atilano Deward ORN, MD      Visit Date: 06/03/2024   Universal Protocol:    Date/Time: 06/03/2024  Consent Given By: the patient  Position: PRONE  Additional Comments: Vital signs were monitored before and after the procedure. Patient was prepped and draped in the usual sterile fashion. The correct patient, procedure, and site was verified.   Injection Procedure Details:    Procedure diagnoses: Lumbar radiculopathy [M54.16]    Meds Administered:  Meds ordered this encounter  Medications   methylPREDNISolone  acetate (DEPO-MEDROL ) injection 40 mg    Laterality: Bilateral  Location/Site: L4  Needle:5.0 in., 22 ga.  Short bevel or Quincke spinal needle  Needle Placement: Transforaminal  Findings:    -Comments: Excellent flow of contrast along the nerve, nerve root and into the epidural space.  Procedure Details: After squaring off the end-plates to get a true AP view, the C-arm was positioned so that an oblique view of the foramen as noted above was visualized. The target area is just inferior to the nose of the scotty dog or sub pedicular. The soft tissues overlying this structure were infiltrated with 2-3 ml. of 1% Lidocaine  without Epinephrine .  The spinal needle was inserted toward the target using a trajectory view along the fluoroscope beam.  Under AP and lateral visualization, the needle was advanced so it did not puncture dura and was located close the 6 O'Clock position of the pedical in AP tracterory. Biplanar projections were used to confirm position. Aspiration was confirmed to be negative for CSF and/or blood. A 1-2 ml. volume of Isovue-250 was injected and flow of contrast was noted at each level. Radiographs were obtained for documentation purposes.   After attaining the desired flow of contrast documented above, a 0.5 to 1.0 ml test dose of 0.25% Marcaine  was injected into each respective transforaminal space.  The patient was observed for 90 seconds post injection.  After no sensory deficits were reported, and normal  lower extremity motor function was noted,   the above injectate was administered so that equal amounts of the injectate were placed at each foramen (level) into the transforaminal epidural space.   Additional Comments:  The patient tolerated the procedure well Dressing: 2 x 2 sterile gauze and Band-Aid    Post-procedure  details: Patient was observed during the procedure. Post-procedure instructions were reviewed.  Patient left the clinic in stable condition.    Clinical History: CLINICAL DATA:  Low back pain with symptoms persisting over 6 weeks of treatment   EXAM: MRI LUMBAR SPINE WITHOUT CONTRAST   TECHNIQUE: Multiplanar, multisequence MR imaging of the lumbar spine was performed. No intravenous contrast was administered.   COMPARISON:  08/15/2020   FINDINGS: Segmentation:  Standard.   Alignment:  Mild scoliosis and L4-5 anterolisthesis.   Vertebrae:  No fracture, evidence of discitis, or bone lesion.   Conus medullaris and cauda equina: Conus extends to the T12 level. Conus and cauda equina appear normal.   Paraspinal and other soft tissues: Postoperative scarring in the lower lumbar soft tissues. Right nephrectomy.   Disc levels:   T12- L1: Ventral spondylitic spurring.  No neural impingement   L1-L2: Disc narrowing and bulging with small central protrusion. Mild thecal sac stenosis   L2-L3: Disc narrowing and bulging with endplate ridging eccentric to the left. Borderline facet spurring. Mild to moderate narrowing of the thecal sac with asymmetric left subarticular recess stenosis, unchanged   L3-L4: Degenerative facet spurring and ligamentous thickening. No neural impingement   L4-L5: Degenerative facet spurring with ligamentum flavum thickening and anterolisthesis. A small degenerative ganglion may be present medial to the right facet joint, 2 mm in diameter. The disc is narrowed and bulging. Moderate spinal stenosis. Bilateral L5 impingement at the subarticular recesses. Degenerative right foraminal crowding but the foramina are patent based on axial images.   L5-S1:Posterior solid arthrodesis appearance. Prominent scar at a laminectomy defect, but noncompressive.   IMPRESSION: 1. Lumbar spine degeneration with mild scoliosis and L4-5 anterolisthesis-no  convincing change from 2021. 2. L4-5 bilateral subarticular recess stenosis and L5 impingement. 3. L2-3 mild to moderate spinal stenosis with asymmetric left subarticular recess narrowing.     Electronically Signed   By: Dorn Roulette M.D.   On: 12/25/2022 07:47     Objective:  VS:  HT:    WT:   BMI:     BP:(!) 149/69  HR:63bpm  TEMP: ( )  RESP:  Physical Exam Vitals and nursing note reviewed.  Constitutional:      General: He is not in acute distress.    Appearance: Normal appearance. He is not ill-appearing.  HENT:     Head: Normocephalic and atraumatic.     Right Ear: External ear normal.     Left Ear: External ear normal.     Nose: No congestion.  Eyes:     Extraocular Movements: Extraocular movements intact.  Cardiovascular:     Rate and Rhythm: Normal rate.     Pulses: Normal pulses.  Pulmonary:     Effort: Pulmonary effort is normal. No respiratory distress.  Abdominal:     General: There is no distension.     Palpations: Abdomen is soft.  Musculoskeletal:        General: No tenderness or signs of injury.     Cervical back: Neck supple.     Right lower leg: No edema.     Left lower leg: No edema.     Comments: Patient has good  distal strength without clonus.  Skin:    Findings: No erythema or rash.  Neurological:     General: No focal deficit present.     Mental Status: He is alert and oriented to person, place, and time.     Sensory: No sensory deficit.     Motor: No weakness or abnormal muscle tone.     Coordination: Coordination normal.  Psychiatric:        Mood and Affect: Mood normal.        Behavior: Behavior normal.      Imaging: No results found.

## 2024-06-14 NOTE — Procedures (Signed)
 Lumbosacral Transforaminal Epidural Steroid Injection - Sub-Pedicular Approach with Fluoroscopic Guidance  Patient: Joshua Strong      Date of Birth: [birth year] MRN: [id number] PCP: Atilano Deward ORN, MD      Visit Date: 06/03/2024   Universal Protocol:    Date/Time: 06/03/2024  Consent Given By: the patient  Position: PRONE  Additional Comments: Vital signs were monitored before and after the procedure. Patient was prepped and draped in the usual sterile fashion. The correct patient, procedure, and site was verified.   Injection Procedure Details:   Procedure diagnoses: Lumbar radiculopathy [M54.16]    Meds Administered:  Meds ordered this encounter  Medications   methylPREDNISolone  acetate (DEPO-MEDROL ) injection 40 mg    Laterality: Bilateral  Location/Site: L4  Needle:5.0 in., 22 ga.  Short bevel or Quincke spinal needle  Needle Placement: Transforaminal  Findings:    -Comments: Excellent flow of contrast along the nerve, nerve root and into the epidural space.  Procedure Details: After squaring off the end-plates to get a true AP view, the C-arm was positioned so that an oblique view of the foramen as noted above was visualized. The target area is just inferior to the nose of the scotty dog or sub pedicular. The soft tissues overlying this structure were infiltrated with 2-3 ml. of 1% Lidocaine  without Epinephrine .  The spinal needle was inserted toward the target using a trajectory view along the fluoroscope beam.  Under AP and lateral visualization, the needle was advanced so it did not puncture dura and was located close the 6 O'Clock position of the pedical in AP tracterory. Biplanar projections were used to confirm position. Aspiration was confirmed to be negative for CSF and/or blood. A 1-2 ml. volume of Isovue-250 was injected and flow of contrast was noted at each level. Radiographs were obtained for documentation purposes.   After attaining the desired flow  of contrast documented above, a 0.5 to 1.0 ml test dose of 0.25% Marcaine  was injected into each respective transforaminal space.  The patient was observed for 90 seconds post injection.  After no sensory deficits were reported, and normal lower extremity motor function was noted,   the above injectate was administered so that equal amounts of the injectate were placed at each foramen (level) into the transforaminal epidural space.   Additional Comments:  The patient tolerated the procedure well Dressing: 2 x 2 sterile gauze and Band-Aid    Post-procedure details: Patient was observed during the procedure. Post-procedure instructions were reviewed.  Patient left the clinic in stable condition.

## 2024-06-22 DIAGNOSIS — Z23 Encounter for immunization: Secondary | ICD-10-CM | POA: Diagnosis not present

## 2024-06-29 DIAGNOSIS — J449 Chronic obstructive pulmonary disease, unspecified: Secondary | ICD-10-CM | POA: Diagnosis not present

## 2024-06-29 DIAGNOSIS — E782 Mixed hyperlipidemia: Secondary | ICD-10-CM | POA: Diagnosis not present

## 2024-06-29 DIAGNOSIS — N400 Enlarged prostate without lower urinary tract symptoms: Secondary | ICD-10-CM | POA: Diagnosis not present
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
## 2024-07-15 DIAGNOSIS — Z5181 Encounter for therapeutic drug level monitoring: Secondary | ICD-10-CM | POA: Diagnosis not present

## 2024-07-15 DIAGNOSIS — M810 Age-related osteoporosis without current pathological fracture: Secondary | ICD-10-CM | POA: Diagnosis not present

## 2024-07-16 ENCOUNTER — Encounter (INDEPENDENT_AMBULATORY_CARE_PROVIDER_SITE_OTHER): Payer: Self-pay | Admitting: Ophthalmology

## 2024-07-16 ENCOUNTER — Ambulatory Visit (INDEPENDENT_AMBULATORY_CARE_PROVIDER_SITE_OTHER): Payer: Medicare Other | Admitting: Ophthalmology

## 2024-07-16 DIAGNOSIS — Z9889 Other specified postprocedural states: Secondary | ICD-10-CM | POA: Diagnosis not present

## 2024-07-16 DIAGNOSIS — H353132 Nonexudative age-related macular degeneration, bilateral, intermediate dry stage: Secondary | ICD-10-CM | POA: Diagnosis not present

## 2024-07-16 DIAGNOSIS — H469 Unspecified optic neuritis: Secondary | ICD-10-CM

## 2024-07-16 DIAGNOSIS — H35033 Hypertensive retinopathy, bilateral: Secondary | ICD-10-CM

## 2024-07-16 DIAGNOSIS — Z961 Presence of intraocular lens: Secondary | ICD-10-CM | POA: Diagnosis not present

## 2024-07-16 DIAGNOSIS — I10 Essential (primary) hypertension: Secondary | ICD-10-CM

## 2024-07-24 ENCOUNTER — Encounter (INDEPENDENT_AMBULATORY_CARE_PROVIDER_SITE_OTHER): Payer: Self-pay | Admitting: Ophthalmology

## 2024-07-26 DIAGNOSIS — M79671 Pain in right foot: Secondary | ICD-10-CM | POA: Diagnosis not present

## 2024-07-26 DIAGNOSIS — L110 Acquired keratosis follicularis: Secondary | ICD-10-CM | POA: Diagnosis not present

## 2024-07-26 DIAGNOSIS — M79675 Pain in left toe(s): Secondary | ICD-10-CM | POA: Diagnosis not present

## 2024-07-26 DIAGNOSIS — M79674 Pain in right toe(s): Secondary | ICD-10-CM | POA: Diagnosis not present

## 2024-07-26 DIAGNOSIS — M79672 Pain in left foot: Secondary | ICD-10-CM | POA: Diagnosis not present

## 2024-07-26 DIAGNOSIS — I739 Peripheral vascular disease, unspecified: Secondary | ICD-10-CM | POA: Diagnosis not present

## 2024-07-30 DIAGNOSIS — N400 Enlarged prostate without lower urinary tract symptoms: Secondary | ICD-10-CM | POA: Diagnosis not present

## 2024-07-30 DIAGNOSIS — K2100 Gastro-esophageal reflux disease with esophagitis, without bleeding: Secondary | ICD-10-CM | POA: Diagnosis not present

## 2024-07-30 DIAGNOSIS — E782 Mixed hyperlipidemia: Secondary | ICD-10-CM | POA: Diagnosis not present

## 2024-08-02 ENCOUNTER — Encounter: Payer: Self-pay | Admitting: Radiology

## 2024-08-27 DIAGNOSIS — K2100 Gastro-esophageal reflux disease with esophagitis, without bleeding: Secondary | ICD-10-CM | POA: Diagnosis not present

## 2024-08-27 DIAGNOSIS — E782 Mixed hyperlipidemia: Secondary | ICD-10-CM | POA: Diagnosis not present

## 2024-08-27 DIAGNOSIS — N400 Enlarged prostate without lower urinary tract symptoms: Secondary | ICD-10-CM | POA: Diagnosis not present

## 2024-08-31 DIAGNOSIS — Z6826 Body mass index (BMI) 26.0-26.9, adult: Secondary | ICD-10-CM | POA: Diagnosis not present

## 2024-08-31 DIAGNOSIS — E039 Hypothyroidism, unspecified: Secondary | ICD-10-CM | POA: Diagnosis not present

## 2024-08-31 DIAGNOSIS — R030 Elevated blood-pressure reading, without diagnosis of hypertension: Secondary | ICD-10-CM | POA: Diagnosis not present

## 2024-08-31 DIAGNOSIS — R42 Dizziness and giddiness: Secondary | ICD-10-CM | POA: Diagnosis not present

## 2024-08-31 DIAGNOSIS — N400 Enlarged prostate without lower urinary tract symptoms: Secondary | ICD-10-CM | POA: Diagnosis not present

## 2024-09-16 DIAGNOSIS — H353122 Nonexudative age-related macular degeneration, left eye, intermediate dry stage: Secondary | ICD-10-CM | POA: Diagnosis not present

## 2024-11-02 ENCOUNTER — Ambulatory Visit: Admitting: Urology

## 2024-11-02 VITALS — BP 117/69 | HR 90

## 2024-11-02 DIAGNOSIS — N401 Enlarged prostate with lower urinary tract symptoms: Secondary | ICD-10-CM

## 2024-11-02 DIAGNOSIS — R351 Nocturia: Secondary | ICD-10-CM

## 2024-11-02 DIAGNOSIS — R399 Unspecified symptoms and signs involving the genitourinary system: Secondary | ICD-10-CM

## 2024-11-02 LAB — URINALYSIS, ROUTINE W REFLEX MICROSCOPIC
Bilirubin, UA: NEGATIVE
Glucose, UA: NEGATIVE
Ketones, UA: NEGATIVE
Nitrite, UA: NEGATIVE
Protein,UA: NEGATIVE
RBC, UA: NEGATIVE
Specific Gravity, UA: 1.015 (ref 1.005–1.030)
Urobilinogen, Ur: 0.2 mg/dL (ref 0.2–1.0)
pH, UA: 5.5 (ref 5.0–7.5)

## 2024-11-02 LAB — MICROSCOPIC EXAMINATION: Bacteria, UA: NONE SEEN

## 2024-11-05 ENCOUNTER — Other Ambulatory Visit: Payer: Self-pay | Admitting: Physical Medicine and Rehabilitation

## 2024-11-05 ENCOUNTER — Telehealth: Payer: Self-pay

## 2024-11-05 DIAGNOSIS — M48062 Spinal stenosis, lumbar region with neurogenic claudication: Secondary | ICD-10-CM

## 2024-11-05 DIAGNOSIS — M5416 Radiculopathy, lumbar region: Secondary | ICD-10-CM

## 2024-11-05 NOTE — Telephone Encounter (Signed)
 Last injection 09/25   1 -Is it the same pain and location as last time? yes 2 -current pain level between 1-10? 8 3 -How long did the injection last? 5.5 months 4- How much relief did you receive? 95% 5 -Any falls or trauma since last injection? none   Thanks    Hadassah ( Mity) S, CCMA Dr. Eldonna and Megan's Team

## 2024-11-22 ENCOUNTER — Encounter: Admitting: Physical Medicine and Rehabilitation

## 2025-07-22 ENCOUNTER — Encounter (INDEPENDENT_AMBULATORY_CARE_PROVIDER_SITE_OTHER): Admitting: Ophthalmology
